# Patient Record
Sex: Female | Born: 1945 | Race: White | Hispanic: No | Marital: Married | Smoking: Never smoker
Health system: Southern US, Community
[De-identification: ages and names within clinical notes are randomized; demographics above are authoritative.]

## PROBLEM LIST (undated history)

## (undated) DIAGNOSIS — M810 Age-related osteoporosis without current pathological fracture: Secondary | ICD-10-CM

## (undated) DIAGNOSIS — M1990 Unspecified osteoarthritis, unspecified site: Secondary | ICD-10-CM

## (undated) DIAGNOSIS — G43909 Migraine, unspecified, not intractable, without status migrainosus: Secondary | ICD-10-CM

## (undated) DIAGNOSIS — K219 Gastro-esophageal reflux disease without esophagitis: Secondary | ICD-10-CM

## (undated) DIAGNOSIS — I10 Essential (primary) hypertension: Secondary | ICD-10-CM

## (undated) DIAGNOSIS — E785 Hyperlipidemia, unspecified: Secondary | ICD-10-CM

## (undated) HISTORY — PX: EYE SURGERY: SHX253

## (undated) HISTORY — DX: Unspecified osteoarthritis, unspecified site: M19.90

## (undated) HISTORY — DX: Essential (primary) hypertension: I10

## (undated) HISTORY — PX: KNEE ARTHROSCOPY: SUR90

## (undated) HISTORY — PX: KNEE CARTILAGE SURGERY: SHX688

## (undated) HISTORY — PX: CARPAL TUNNEL RELEASE: SHX101

## (undated) HISTORY — PX: SPINAL FUSION: SHX223

## (undated) HISTORY — PX: CERVICAL FUSION: SHX112

## (undated) HISTORY — DX: Gastro-esophageal reflux disease without esophagitis: K21.9

## (undated) HISTORY — DX: Migraine, unspecified, not intractable, without status migrainosus: G43.909

## (undated) HISTORY — DX: Age-related osteoporosis without current pathological fracture: M81.0

## (undated) HISTORY — DX: Hyperlipidemia, unspecified: E78.5

## (undated) HISTORY — PX: ABDOMINAL HYSTERECTOMY: SHX81

## (undated) HISTORY — PX: ROTATOR CUFF REPAIR: SHX139

## (undated) HISTORY — PX: OTHER SURGICAL HISTORY: SHX169

---

## 2001-01-12 ENCOUNTER — Encounter: Payer: Self-pay | Admitting: Family Medicine

## 2001-01-12 ENCOUNTER — Ambulatory Visit (HOSPITAL_COMMUNITY): Admission: RE | Admit: 2001-01-12 | Discharge: 2001-01-12 | Payer: Self-pay | Admitting: Family Medicine

## 2002-04-19 ENCOUNTER — Encounter: Payer: Self-pay | Admitting: Family Medicine

## 2002-04-19 ENCOUNTER — Ambulatory Visit (HOSPITAL_COMMUNITY): Admission: RE | Admit: 2002-04-19 | Discharge: 2002-04-19 | Payer: Self-pay | Admitting: Family Medicine

## 2003-06-07 ENCOUNTER — Ambulatory Visit (HOSPITAL_COMMUNITY): Admission: RE | Admit: 2003-06-07 | Discharge: 2003-06-07 | Payer: Self-pay | Admitting: Family Medicine

## 2004-07-30 ENCOUNTER — Ambulatory Visit (HOSPITAL_COMMUNITY): Admission: RE | Admit: 2004-07-30 | Discharge: 2004-07-30 | Payer: Self-pay | Admitting: Family Medicine

## 2004-08-07 ENCOUNTER — Ambulatory Visit (HOSPITAL_COMMUNITY): Admission: RE | Admit: 2004-08-07 | Discharge: 2004-08-07 | Payer: Self-pay | Admitting: Orthopedic Surgery

## 2004-08-07 ENCOUNTER — Ambulatory Visit (HOSPITAL_BASED_OUTPATIENT_CLINIC_OR_DEPARTMENT_OTHER): Admission: RE | Admit: 2004-08-07 | Discharge: 2004-08-07 | Payer: Self-pay | Admitting: Orthopedic Surgery

## 2004-08-23 ENCOUNTER — Ambulatory Visit: Payer: Self-pay | Admitting: Hematology & Oncology

## 2004-09-14 ENCOUNTER — Ambulatory Visit (HOSPITAL_COMMUNITY): Admission: RE | Admit: 2004-09-14 | Discharge: 2004-09-14 | Payer: Self-pay | Admitting: Hematology & Oncology

## 2004-09-20 ENCOUNTER — Ambulatory Visit (HOSPITAL_COMMUNITY): Admission: RE | Admit: 2004-09-20 | Discharge: 2004-09-20 | Payer: Self-pay | Admitting: Hematology & Oncology

## 2004-10-12 ENCOUNTER — Ambulatory Visit: Payer: Self-pay | Admitting: Hematology & Oncology

## 2005-08-26 ENCOUNTER — Ambulatory Visit (HOSPITAL_COMMUNITY): Admission: RE | Admit: 2005-08-26 | Discharge: 2005-08-26 | Payer: Self-pay | Admitting: Family Medicine

## 2006-04-11 ENCOUNTER — Encounter: Admission: RE | Admit: 2006-04-11 | Discharge: 2006-04-11 | Payer: Self-pay | Admitting: Orthopedic Surgery

## 2016-01-29 MED FILL — HUMIRA PEN 40 MG/0.8ML PNKT: 40 | 28 days supply | Qty: 2 | Fill #0

## 2016-02-23 MED FILL — HUMIRA PEN 40 MG/0.8ML PNKT: 40 | 28 days supply | Qty: 2 | Fill #1

## 2016-03-18 ENCOUNTER — Other Ambulatory Visit: Payer: Self-pay | Admitting: Rheumatology

## 2016-03-19 LAB — CBC WITH DIFFERENTIAL/PLATELET
BASOS PCT: 0 %
Basophils Absolute: 0 cells/uL (ref 0–200)
EOS PCT: 3 %
Eosinophils Absolute: 303 cells/uL (ref 15–500)
HCT: 43.3 % (ref 35.0–45.0)
Hemoglobin: 14.5 g/dL (ref 11.7–15.5)
LYMPHS PCT: 36 %
Lymphs Abs: 3636 cells/uL (ref 850–3900)
MCH: 32.4 pg (ref 27.0–33.0)
MCHC: 33.5 g/dL (ref 32.0–36.0)
MCV: 96.9 fL (ref 80.0–100.0)
MPV: 9.8 fL (ref 7.5–12.5)
Monocytes Absolute: 1111 cells/uL — ABNORMAL HIGH (ref 200–950)
Monocytes Relative: 11 %
NEUTROS PCT: 50 %
Neutro Abs: 5050 cells/uL (ref 1500–7800)
PLATELETS: 334 10*3/uL (ref 140–400)
RBC: 4.47 MIL/uL (ref 3.80–5.10)
RDW: 13.7 % (ref 11.0–15.0)
WBC: 10.1 10*3/uL (ref 3.8–10.8)

## 2016-03-19 LAB — COMPLETE METABOLIC PANEL WITH GFR
ALBUMIN: 4 g/dL (ref 3.6–5.1)
ALK PHOS: 67 U/L (ref 33–130)
ALT: 14 U/L (ref 6–29)
AST: 21 U/L (ref 10–35)
BUN: 24 mg/dL (ref 7–25)
CALCIUM: 9.9 mg/dL (ref 8.6–10.4)
CHLORIDE: 102 mmol/L (ref 98–110)
CO2: 27 mmol/L (ref 20–31)
CREATININE: 0.56 mg/dL — AB (ref 0.60–0.93)
GFR, Est Non African American: >89 mL/min (ref >=60)
Glucose, Bld: 92 mg/dL (ref 65–99)
POTASSIUM: 4.8 mmol/L (ref 3.5–5.3)
Sodium: 137 mmol/L (ref 135–146)
Total Bilirubin: 0.3 mg/dL (ref 0.2–1.2)
Total Protein: 6.9 g/dL (ref 6.1–8.1)

## 2016-03-20 ENCOUNTER — Telehealth: Payer: Self-pay | Admitting: Radiology

## 2016-03-20 NOTE — Telephone Encounter (Signed)
I have called patient to advise labs are normal  

## 2016-03-25 MED FILL — HUMIRA PEN 40 MG/0.8ML PNKT: 40 | 28 days supply | Qty: 2 | Fill #2

## 2016-03-28 ENCOUNTER — Other Ambulatory Visit: Payer: Self-pay | Admitting: Rheumatology

## 2016-03-28 NOTE — Telephone Encounter (Signed)
Last Visit: 12/28/15 Next Visit due January / February 2018. Message sent to front desk to schedule patient.  Labs: 03/18/16 WNL   Okay to refill MTX?

## 2016-04-18 ENCOUNTER — Other Ambulatory Visit: Payer: Self-pay | Admitting: *Deleted

## 2016-04-18 MED ORDER — CELECOXIB 200 MG PO CAPS
200.0000 mg | ORAL_CAPSULE | Freq: Every day | ORAL | 0 refills | Status: DC
Start: 2016-04-18 — End: 2016-05-02

## 2016-04-18 NOTE — Telephone Encounter (Signed)
Refill request received via fax for Celebrex  Last Visit: 12/28/15 Next Visit: 05/02/16 Labs: 03/18/16 WNL  Okay to refill Celebrex?

## 2016-05-02 ENCOUNTER — Encounter: Payer: Self-pay | Admitting: Rheumatology

## 2016-05-02 ENCOUNTER — Ambulatory Visit (INDEPENDENT_AMBULATORY_CARE_PROVIDER_SITE_OTHER): Payer: PRIVATE HEALTH INSURANCE | Admitting: Rheumatology

## 2016-05-02 VITALS — BP 128/68 | HR 62 | Resp 14 | Ht 60.0 in | Wt 128.0 lb

## 2016-05-02 DIAGNOSIS — L408 Other psoriasis: Secondary | ICD-10-CM

## 2016-05-02 DIAGNOSIS — L4050 Arthropathic psoriasis, unspecified: Secondary | ICD-10-CM

## 2016-05-02 DIAGNOSIS — M8580 Other specified disorders of bone density and structure, unspecified site: Secondary | ICD-10-CM

## 2016-05-02 DIAGNOSIS — Z79899 Other long term (current) drug therapy: Secondary | ICD-10-CM

## 2016-05-02 DIAGNOSIS — M353 Polymyalgia rheumatica: Secondary | ICD-10-CM

## 2016-05-02 DIAGNOSIS — L409 Psoriasis, unspecified: Secondary | ICD-10-CM | POA: Insufficient documentation

## 2016-05-02 DIAGNOSIS — M5440 Lumbago with sciatica, unspecified side: Secondary | ICD-10-CM

## 2016-05-02 LAB — CBC WITH DIFFERENTIAL/PLATELET
BASOS PCT: 0 %
Basophils Absolute: 0 cells/uL (ref 0–200)
EOS ABS: 231 {cells}/uL (ref 15–500)
EOS PCT: 3 %
HCT: 40.2 % (ref 35.0–45.0)
Hemoglobin: 13.5 g/dL (ref 11.7–15.5)
LYMPHS ABS: 4004 {cells}/uL — AB (ref 850–3900)
Lymphocytes Relative: 52 %
MCH: 32.8 pg (ref 27.0–33.0)
MCHC: 33.6 g/dL (ref 32.0–36.0)
MCV: 97.8 fL (ref 80.0–100.0)
MONOS PCT: 10 %
MPV: 9.8 fL (ref 7.5–12.5)
Monocytes Absolute: 770 cells/uL (ref 200–950)
NEUTROS ABS: 2695 {cells}/uL (ref 1500–7800)
Neutrophils Relative %: 35 %
PLATELETS: 342 10*3/uL (ref 140–400)
RBC: 4.11 MIL/uL (ref 3.80–5.10)
RDW: 13.8 % (ref 11.0–15.0)
WBC: 7.7 10*3/uL (ref 3.8–10.8)

## 2016-05-02 MED ORDER — LIDOCAINE 5 % EX PTCH: 1.0000 | MEDICATED_PATCH | CUTANEOUS | Status: AC

## 2016-05-02 MED ORDER — METHOTREXATE SODIUM CHEMO INJECTION 50 MG/2ML: 20.0000 mg | INTRAMUSCULAR | 0 refills | Status: DC

## 2016-05-02 MED ORDER — ADALIMUMAB 40 MG/0.8ML ~~LOC~~ AJKT
1.0000 | AUTO-INJECTOR | SUBCUTANEOUS | 2 refills | Status: DC
Start: 2016-05-02 — End: 2016-05-02

## 2016-05-02 MED ORDER — CLOBETASOL PROPIONATE 0.05 % EX CREA: 1.0000 "application " | TOPICAL_CREAM | Freq: Two times a day (BID) | CUTANEOUS | 0 refills | Status: DC

## 2016-05-02 MED ORDER — ADALIMUMAB 40 MG/0.8ML ~~LOC~~ AJKT: 1.0000 "pen " | AUTO-INJECTOR | SUBCUTANEOUS | 2 refills | Status: DC

## 2016-05-02 MED FILL — HUMIRA PEN 40 MG/0.8ML PNKT: 40 | 28 days supply | Qty: 2 | Fill #0

## 2016-05-02 NOTE — Progress Notes (Signed)
Office Visit Note  Patient: Amy Hayes             Date of Birth: 1946-03-31           MRN: [id number]             PCP: Ardelle Lesches, MD Referring: No ref. provider found Visit Date: 05/02/2016 Occupation: @GUAROCC @    Subjective:  Follow-up Psoriatic arthritis, psoriasis, high risk prescription  History of Present Illness: Amy Hayes is a 71 y.o. female  Last seen 12/28/2015 Patient is taking Humira every 2 weeks, methotrexate 0.8 ML's every week and folic acid 2 mg every day with good results. Unfortunately she is having a mild flare of psoriasis to bilateral lateral scapular area. Please see photo for full details.  No joint pain. No synovitis. No synovial thickening, joint swelling, joint stiffness.   Activities of Daily Living:  Patient reports morning stiffness for 15 minutes.   Patient Denies nocturnal pain.  Difficulty dressing/grooming: Denies Difficulty climbing stairs: Denies Difficulty getting out of chair: Denies Difficulty using hands for taps, buttons, cutlery, and/or writing: Denies   No Rheumatology ROS completed.   PMFS History:  Patient Active Problem List   Diagnosis Date Noted  . Polymyalgia rheumatica (HCC) 05/02/2016  . Other psoriasis 05/02/2016  . Bilateral low back pain with sciatica 05/02/2016  . Osteopenia 05/02/2016    No past medical history on file.  No family history on file. No past surgical history on file. Social History   Social History Narrative  . No narrative on file     Objective: Vital Signs: BP 128/68   Pulse 62   Resp 14   Ht 5' (1.524 m)   Wt 128 lb (58.1 kg)   LMP  (Approximate)   BMI 25.00 kg/m    Physical Exam   Musculoskeletal Exam:  Full range of motion of all joints Grip strength is equal and strong bilaterally For myalgia tender points are all absent Note that the neck is fused she has slightly decreased range of motion on the right side.  CDAI Exam: No CDAI exam completed.  No  synovitis  She has prominent DIP PIP bilaterally  Investigation: No additional findings. See Epic for labs done November 2017. They are within normal limits. She'll be due for labs about next week so patient wants [place] to draw today.  Imaging: No results found.        Speciality Comments: No specialty comments available.    Procedures:  No procedures performed Allergies: Codeine and Iodine   Assessment / Plan:     Visit Diagnoses: Polymyalgia rheumatica (HCC)  Other psoriasis  Bilateral low back pain with sciatica, sciatica laterality unspecified, unspecified chronicity - Plan: lidocaine (LIDODERM) 5 % 1 patch  Osteopenia, unspecified location  High risk medications (not anticoagulants) long-term use - Plan: CBC with Differential/Platelet, COMPLETE METABOLIC PANEL WITH GFR, COMPLETE METABOLIC PANEL WITH GFR, CBC with Differential/Platelet, Quantiferon tb gold assay (blood)  Plan: #1 psoriatic arthritis, psoriasis fairly well-controlled with the combination of Humira every 2 weeks, methotrexate 0.8 ML's every week, folic acid 2 mg every day.  #2: Patient has a mild flare for psoriasis over the last few days. She does not have clobetasol cream and wants [place] to give her refill  #3: History of nail dystrophy  #4. 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 He  #5: CBC with differential, CMP with GFR today.  #6: Patient will be due for CBC with differential,  CMP with GFR, TB gold in about 3 months.  #7: Patient's last bone density was March 2017 with a T score of the right radius at -2.0. We will monitor. Note that compared to the 2015 DEXA there is only -2.2% change which is insignificant. I've encouraged the patient to continue weightbearing exercise, calcium, vitamin D.  Return to clinic in 4-5 months for regular follow-up.  Orders: Orders Placed This Encounter  Procedures  . CBC with Differential/Platelet  . COMPLETE METABOLIC PANEL WITH GFR  .  COMPLETE METABOLIC PANEL WITH GFR  . CBC with Differential/Platelet  . Quantiferon tb gold assay (blood)   Meds ordered this encounter  Medications  . lidocaine (LIDODERM) 5 % 1 patch    Give 30 day supply to patient. With 2 refills  . clobetasol cream (TEMOVATE) 0.05 %    Sig: Apply 1 application topically 2 (two) times daily.    Dispense:  30 g    Refill:  0    Order Specific Question:   Supervising Provider    Answer:   Pollyann SavoyEVESHWAR, SHAILI [[id number]]  . methotrexate 50 MG/2ML injection    Sig: Inject 0.8 mLs (20 mg total) into the skin once a week.    Dispense:  2 mL    Refill:  0    Please dispense 2 ML vials with preservatives with sufficient quantity for ninety-day supply    Order Specific Question:   Supervising Provider    Answer:   Pollyann SavoyEVESHWAR, SHAILI [[id number]]  . Adalimumab (HUMIRA PEN) 40 MG/0.8ML PNKT    Sig: Inject 1 pen into the skin every 14 (fourteen) days.    Dispense:  2 each    Refill:  2    Order Specific Question:   Supervising Provider    Answer:   Pollyann SavoyEVESHWAR, SHAILI [phone][[id number]]    Face-to-face time spent with patient was 40 minutes. 50% of time was spent in counseling and coordination of care.  Follow-Up Instructions: Return in about 5 months (around 09/30/2016) for PsA,Ps,HRRX,Humira,Mtx0.778ml,.   Tawni PummelNaitik Burdette Forehand, PA-C  Note - This record has been created using [employer] software.  Chart creation errors have been sought, but may not always  have been located. Such creation errors do not reflect on  the standard of medical care.

## 2016-05-03 LAB — COMPLETE METABOLIC PANEL WITH GFR
ALT: 16 U/L (ref 6–29)
AST: 24 U/L (ref 10–35)
Albumin: 3.8 g/dL (ref 3.6–5.1)
Alkaline Phosphatase: 59 U/L (ref 33–130)
BILIRUBIN TOTAL: 0.6 mg/dL (ref 0.2–1.2)
BUN: 23 mg/dL (ref 7–25)
CO2: 23 mmol/L (ref 20–31)
CREATININE: 0.68 mg/dL (ref 0.60–0.93)
Calcium: 9.1 mg/dL (ref 8.6–10.4)
Chloride: 102 mmol/L (ref 98–110)
GFR, Est African American: >89 mL/min (ref >=60)
GFR, Est Non African American: 89 mL/min (ref >=60)
GLUCOSE: 81 mg/dL (ref 65–99)
Potassium: 4.5 mmol/L (ref 3.5–5.3)
SODIUM: 137 mmol/L (ref 135–146)
TOTAL PROTEIN: 6.6 g/dL (ref 6.1–8.1)

## 2016-05-03 NOTE — Progress Notes (Signed)
Tell patientCMP with GFR is normalCBC with differential is within normal limitsNo change in treatmentSend copy of these labs to PCP

## 2016-05-20 ENCOUNTER — Other Ambulatory Visit: Payer: Self-pay | Admitting: *Deleted

## 2016-05-20 MED ORDER — CELECOXIB 200 MG PO CAPS: 200.0000 mg | ORAL_CAPSULE | Freq: Every day | ORAL | 0 refills | Status: DC

## 2016-05-20 NOTE — Telephone Encounter (Signed)
Refill request received via fax for Celebrex  Last Visit: 05/02/16 Next Visit: 09/30/16 Labs: 05/02/16 WNL  Okay to refill Celebrex?

## 2016-05-29 ENCOUNTER — Other Ambulatory Visit: Payer: Self-pay | Admitting: Rheumatology

## 2016-05-29 MED ORDER — CLOBETASOL PROPIONATE 0.05 % EX CREA: 1.0000 "application " | TOPICAL_CREAM | Freq: Two times a day (BID) | CUTANEOUS | 0 refills | Status: DC

## 2016-05-29 NOTE — Telephone Encounter (Signed)
Patient would like to know if she can get a refill of the ointment that Mr. Leane Callanwala prescribed at her last visit for the rash on her back. Patient states her back is "scaly and looks like a sunburn now." Patient is [facility] [place].

## 2016-05-29 NOTE — Telephone Encounter (Signed)
Patient states she the area she has been using the cream on is peeling and looking like a sunburn. Patient has been using the Clobetasol Cream. She states she used it from May 02, 2016  X 1 week then stopped for a week and then started it again for a week. Patient states it does help. Patient is requesting a refill.   Last visit: 05/02/16 Next Visit: 09/30/16  Okay to refill: Clobetasol Cream?

## 2016-05-30 ENCOUNTER — Telehealth: Payer: Self-pay

## 2016-05-30 ENCOUNTER — Telehealth: Payer: Self-pay | Admitting: Pharmacist

## 2016-05-30 MED FILL — HUMIRA PEN 40 MG/0.8ML PNKT: 40 | 28 days supply | Qty: 2 | Fill #1

## 2016-05-30 NOTE — Telephone Encounter (Signed)
Entered in error

## 2016-05-30 NOTE — Telephone Encounter (Signed)
Noted patient's last refill of humira was on 05/02/16 at [facility]. I called patient with a refill reminder call. The phone was out of service so I reached her husband on his phone. Mr. Sharl MaKerr confirms that his wife needs a refill mailed to their home.     Will have the outpatient pharmacy process patient's prescription at this time.    Kyon Bentler, [place], CPhT

## 2016-06-24 ENCOUNTER — Telehealth: Payer: Self-pay

## 2016-06-24 MED FILL — HUMIRA PEN 40 MG/0.8ML PNKT: 40 | 28 days supply | Qty: 2 | Fill #2

## 2016-06-24 NOTE — Telephone Encounter (Signed)
Noted patient's last refill of Humira was on 05/30/16 at [facility]. I called patient with a refill reminder call.  Patient confirms she does need a refill at this time. Address was verified. Will have the outpatient pharmacy process patient's prescription at this time and mail to her home.   Paeton Latouche, [place], CPhT 12:19 PM

## 2016-07-26 ENCOUNTER — Other Ambulatory Visit: Payer: Self-pay | Admitting: Rheumatology

## 2016-07-26 NOTE — Telephone Encounter (Signed)
Last visit: 05/02/16 Next Visit: 09/30/16  Okay to refill Syringes?

## 2016-07-31 ENCOUNTER — Other Ambulatory Visit: Payer: Self-pay | Admitting: Pharmacist

## 2016-07-31 ENCOUNTER — Telehealth: Payer: Self-pay

## 2016-07-31 DIAGNOSIS — Z79899 Other long term (current) drug therapy: Secondary | ICD-10-CM

## 2016-07-31 NOTE — Telephone Encounter (Signed)
Noted patient's last refill of Humira was on 06/24/16 at [facility].  I called patient with a refill reminder call.  Patient confirms she does need a refill at this time. She will use her last pen on Saturday, April 14th. She is out of refills at the pharmacy.   Can we send a new Rx to [facility] for her? Thanks!  Dyasia Firestine, [place], CPhT 9:51 AM

## 2016-07-31 NOTE — Telephone Encounter (Signed)
Patient is needing Humira refill.  Reviewed her chart and noted that she is due for standing labs and TB Gold.  I called patient and informed her of this.  Patient confirms she will go to the lab today or tomorrow to get labs drawn.  Lab orders have been released.

## 2016-08-01 ENCOUNTER — Other Ambulatory Visit: Payer: Self-pay | Admitting: *Deleted

## 2016-08-01 ENCOUNTER — Other Ambulatory Visit: Payer: Self-pay | Admitting: Rheumatology

## 2016-08-01 ENCOUNTER — Telehealth: Payer: Self-pay | Admitting: Rheumatology

## 2016-08-01 DIAGNOSIS — Z79899 Other long term (current) drug therapy: Secondary | ICD-10-CM

## 2016-08-01 DIAGNOSIS — Z9225 Personal history of immunosupression therapy: Secondary | ICD-10-CM

## 2016-08-01 NOTE — Telephone Encounter (Signed)
Patient left a message and wanted someone to call her in regards to a drug test she needs to get from the lab.  CB#[phone].  Thank you.

## 2016-08-01 NOTE — Telephone Encounter (Signed)
Contacted patient, she was calling to verify her orders for the TB Gold , CBC and CMP have been faxed the lab in [place]. Patient advised that the orders have been faxed. Patient is not due a drug test through our office.

## 2016-08-01 NOTE — Telephone Encounter (Signed)
Lab orders released and faxed.  

## 2016-08-01 NOTE — Telephone Encounter (Signed)
Patient is at [facility] in [place].  They need the lab orders faxed to [phone].  Thank you.

## 2016-08-02 LAB — CBC WITH DIFFERENTIAL/PLATELET
Basophils Absolute: 83 cells/uL (ref 0–200)
Basophils Relative: 1 %
EOS PCT: 3 %
Eosinophils Absolute: 249 cells/uL (ref 15–500)
HCT: 41.9 % (ref 35.0–45.0)
HEMOGLOBIN: 13.7 g/dL (ref 11.7–15.5)
LYMPHS ABS: 3486 {cells}/uL (ref 850–3900)
LYMPHS PCT: 42 %
MCH: 32.2 pg (ref 27.0–33.0)
MCHC: 32.7 g/dL (ref 32.0–36.0)
MCV: 98.4 fL (ref 80.0–100.0)
MONO ABS: 830 {cells}/uL (ref 200–950)
MPV: 9.9 fL (ref 7.5–12.5)
Monocytes Relative: 10 %
Neutro Abs: 3652 cells/uL (ref 1500–7800)
Neutrophils Relative %: 44 %
Platelets: 329 10*3/uL (ref 140–400)
RBC: 4.26 MIL/uL (ref 3.80–5.10)
RDW: 13.6 % (ref 11.0–15.0)
WBC: 8.3 10*3/uL (ref 3.8–10.8)

## 2016-08-02 LAB — COMPLETE METABOLIC PANEL WITH GFR
ALT: 14 U/L (ref 6–29)
AST: 24 U/L (ref 10–35)
Albumin: 3.6 g/dL (ref 3.6–5.1)
Alkaline Phosphatase: 67 U/L (ref 33–130)
BUN: 19 mg/dL (ref 7–25)
CALCIUM: 9.2 mg/dL (ref 8.6–10.4)
CHLORIDE: 105 mmol/L (ref 98–110)
CO2: 22 mmol/L (ref 20–31)
Creat: 0.61 mg/dL (ref 0.60–0.93)
GFR, Est Non African American: >89 mL/min (ref >=60)
Glucose, Bld: 91 mg/dL (ref 65–99)
Potassium: 4.4 mmol/L (ref 3.5–5.3)
Sodium: 137 mmol/L (ref 135–146)
Total Bilirubin: 0.4 mg/dL (ref 0.2–1.2)
Total Protein: 6.5 g/dL (ref 6.1–8.1)

## 2016-08-02 MED ORDER — ADALIMUMAB 40 MG/0.8ML ~~LOC~~ AJKT: 1.0000 "pen " | AUTO-INJECTOR | SUBCUTANEOUS | 2 refills | Status: DC

## 2016-08-02 MED FILL — HUMIRA PEN 40 MG/0.8ML PNKT: 40 | 28 days supply | Qty: 2 | Fill #0

## 2016-08-02 NOTE — Telephone Encounter (Signed)
Last Visit: 05/02/16 Next Visit: 09/30/16 Labs: 05/02/16 WNL Patient updated labs on 08/01/16 TB Gold: 08/31/15 Neg  Okay to refill Humira?

## 2016-08-02 NOTE — Telephone Encounter (Signed)
Okay to refill HumiraPatient's TB gold is due to be updated May 2018.Patient has gone to labs yesterday to get the updated according to the above.Okay to refill Humira.Please confirm that I refilled the Humira

## 2016-08-03 LAB — QUANTIFERON TB GOLD ASSAY (BLOOD)
INTERFERON GAMMA RELEASE ASSAY: NEGATIVE
Mitogen-Nil: 9.13 IU/mL
QUANTIFERON NIL VALUE: 0.12 [IU]/mL

## 2016-08-03 NOTE — Progress Notes (Signed)
WNL

## 2016-08-13 ENCOUNTER — Other Ambulatory Visit: Payer: Self-pay | Admitting: Rheumatology

## 2016-08-13 NOTE — Telephone Encounter (Signed)
05/02/16 last visit  09/30/16 next visit  Ok to refill per Dr Corliss Skains

## 2016-08-18 ENCOUNTER — Other Ambulatory Visit: Payer: Self-pay | Admitting: Rheumatology

## 2016-08-19 NOTE — Telephone Encounter (Signed)
05/02/16 last visit  09/30/16 next visit  Labs WNL CBC CMP 08/01/16 Ok to refill per Dr Corliss Skains

## 2016-09-06 ENCOUNTER — Telehealth: Payer: Self-pay

## 2016-09-06 MED FILL — HUMIRA PEN 40 MG/0.8ML PNKT: 40 | 28 days supply | Qty: 2 | Fill #1

## 2016-09-06 NOTE — Telephone Encounter (Signed)
Received a [facility] from the [facility] with a prior authorization request for Humira. A request was submitted through Cover my meds. Will update once we receive a response.  Kerrigan Gombos, [place], CPhT 8:50 AM

## 2016-09-18 ENCOUNTER — Other Ambulatory Visit: Payer: Self-pay | Admitting: Rheumatology

## 2016-09-19 NOTE — Telephone Encounter (Signed)
Last Visit: 05/02/16 Next Visit: 09/30/16 Labs: 08/01/16 WNL  Okay to refill MTX?

## 2016-09-23 ENCOUNTER — Other Ambulatory Visit: Payer: Self-pay | Admitting: Rheumatology

## 2016-09-23 NOTE — Telephone Encounter (Signed)
Last Visit: 05/02/16 Next Visit: 09/30/16  Okay to refill Folic Acid?

## 2016-09-27 ENCOUNTER — Other Ambulatory Visit: Payer: Self-pay | Admitting: Rheumatology

## 2016-09-27 NOTE — Telephone Encounter (Signed)
05/02/16 last visit  09/30/16 next visit  Labs WNL 08/01/16 Ok to refill Celebrex ?

## 2016-09-30 ENCOUNTER — Encounter: Payer: Self-pay | Admitting: Rheumatology

## 2016-09-30 ENCOUNTER — Ambulatory Visit (INDEPENDENT_AMBULATORY_CARE_PROVIDER_SITE_OTHER): Payer: PRIVATE HEALTH INSURANCE | Admitting: Rheumatology

## 2016-09-30 VITALS — BP 124/62 | HR 68 | Resp 14 | Ht 63.0 in | Wt 124.0 lb

## 2016-09-30 DIAGNOSIS — L408 Other psoriasis: Secondary | ICD-10-CM

## 2016-09-30 DIAGNOSIS — M8580 Other specified disorders of bone density and structure, unspecified site: Secondary | ICD-10-CM | POA: Diagnosis not present

## 2016-09-30 DIAGNOSIS — Z79899 Other long term (current) drug therapy: Secondary | ICD-10-CM

## 2016-09-30 DIAGNOSIS — L4050 Arthropathic psoriasis, unspecified: Secondary | ICD-10-CM | POA: Diagnosis not present

## 2016-09-30 LAB — CBC WITH DIFFERENTIAL/PLATELET
BASOS ABS: 0 {cells}/uL (ref 0–200)
Basophils Relative: 0 %
EOS PCT: 3 %
Eosinophils Absolute: 222 cells/uL (ref 15–500)
HEMATOCRIT: 44.3 % (ref 35.0–45.0)
HEMOGLOBIN: 14.7 g/dL (ref 11.7–15.5)
LYMPHS ABS: 2886 {cells}/uL (ref 850–3900)
Lymphocytes Relative: 39 %
MCH: 32.5 pg (ref 27.0–33.0)
MCHC: 33.2 g/dL (ref 32.0–36.0)
MCV: 97.8 fL (ref 80.0–100.0)
MPV: 9.6 fL (ref 7.5–12.5)
Monocytes Absolute: 962 cells/uL — ABNORMAL HIGH (ref 200–950)
Monocytes Relative: 13 %
NEUTROS PCT: 45 %
Neutro Abs: 3330 cells/uL (ref 1500–7800)
Platelets: 337 10*3/uL (ref 140–400)
RBC: 4.53 MIL/uL (ref 3.80–5.10)
RDW: 13.8 % (ref 11.0–15.0)
WBC: 7.4 10*3/uL (ref 3.8–10.8)

## 2016-09-30 NOTE — Patient Instructions (Signed)
Standing Labs We placed an order today for your standing lab work.    Please come back and get your standing labs in  September 2018 December 2018 March 2019 (In March 2019 consider getting a TB gold; contact our office)  We have open lab Monday through Friday from 8:30-11:30 AM and 1:30-4 PM at the office of Dr. Arbutus PedShaili Deveshwar/Sherrod Toothman, PA.   The office is located at [address], [address], [place], [place] [address] No appointment is necessary.   Labs are drawn by [facility].  You may receive a bill from [facility] for your lab work. If you have any questions regarding directions or hours of operation,  please call [phone].

## 2016-09-30 NOTE — Progress Notes (Signed)
Office Visit Note  Patient: Amy Hayes             Date of Birth: [birth year]           MRN: [id number]             PCP: Phebe Colla, MD Referring: Phebe Colla, MD Visit Date: 09/30/2016 Occupation: @GUAROCC @    Subjective:  Medication Management   History of Present Illness: Amy Hayes is a 71 y.o. female  May 02 2016  Patient has a history of psoriasis and psoriatic arthritis. She is on Humira every 2 weeks and methotrexate 0.20m every week. Folic acid 247mqd  Currently she is doing well with her psoriasis and psoriatic arthritis.  Patient is currently moving from her current house to a different house and is finding the packing and heavy lifting to be very strenuous.    Activities of Daily Living:  Patient reports morning stiffness for 15 minutes.   Patient Denies nocturnal pain.  Difficulty dressing/grooming: Denies Difficulty climbing stairs: Denies Difficulty getting out of chair: Denies Difficulty using hands for taps, buttons, cutlery, and/or writing: Denies   Review of Systems  Constitutional: Negative for fatigue.  HENT: Negative for mouth sores and mouth dryness.   Eyes: Negative for dryness.  Respiratory: Negative for shortness of breath.   Gastrointestinal: Negative for constipation and diarrhea.  Musculoskeletal: Negative for myalgias and myalgias.  Skin: Negative for sensitivity to sunlight.  Psychiatric/Behavioral: Negative for decreased concentration and sleep disturbance.    PMFS History:  Patient Active Problem List   Diagnosis Date Noted  . Polymyalgia rheumatica ([place]/02/2017  . Other psoriasis 05/02/2016  . Bilateral low back pain with sciatica 05/02/2016  . Osteopenia 05/02/2016    Past Medical History:  Diagnosis Date  . GERD (gastroesophageal reflux disease)   . Osteoporosis     No family history on file. Past Surgical History:  Procedure Laterality Date  . CARPAL TUNNEL RELEASE    . KNEE ARTHROSCOPY Right     . SPINAL FUSION     cervical fusion   Social History   Social History Narrative  . No narrative on file     Objective: Vital Signs: BP 124/62   Pulse 68   Resp 14   Ht 5' 3"  (1.6 m)   Wt 124 lb (56.2 kg)   BMI 21.97 kg/m    Physical Exam  Constitutional: She is oriented to person, place, and time. She appears well-developed and well-nourished.  HENT:  Head: Normocephalic and atraumatic.  Eyes: EOM are normal. Pupils are equal, round, and reactive to light.  Cardiovascular: Normal rate, regular rhythm and normal heart sounds.  Exam reveals no gallop and no friction rub.   No murmur heard. Pulmonary/Chest: Effort normal and breath sounds normal. She has no wheezes. She has no rales.  Abdominal: Soft. Bowel sounds are normal. She exhibits no distension. There is no tenderness. There is no guarding. No hernia.  Musculoskeletal: Normal range of motion. She exhibits no edema, tenderness or deformity.  Lymphadenopathy:    She has no cervical adenopathy.  Neurological: She is alert and oriented to person, place, and time. Coordination normal.  Skin: Skin is warm and dry. Capillary refill takes less than 2 seconds. No rash noted.  Psychiatric: She has a normal mood and affect. Her behavior is normal.  Nursing note and vitals reviewed.    Musculoskeletal Exam:  Full range of motion of all joints Grip strength is equal and strong  bilaterally Fibromyalgia tender points are all absent  CDAI Exam: No CDAI exam completed.  No synovitis on examination DIP PIP prominence bilaterally with few angulations which is source of some of her hand pain. Note: She does not have any synovitis in her joint pain is not coming from psoriatic arthritis  Investigation: No additional findings. Orders Only on 08/01/2016  Component Date Value Ref Range Status  . Sodium 08/01/2016 137  135 - 146 mmol/L Final  . Potassium 08/01/2016 4.4  3.5 - 5.3 mmol/L Final  . Chloride 08/01/2016 105  98 - 110  mmol/L Final  . CO2 08/01/2016 22  20 - 31 mmol/L Final  . Glucose, Bld 08/01/2016 91  65 - 99 mg/dL Final  . BUN 08/01/2016 19  7 - 25 mg/dL Final  . Creat 08/01/2016 0.61  0.60 - 0.93 mg/dL Final   Comment:   For patients > or = 71 years of age: The upper reference limit for Creatinine is approximately 13% higher for people identified as African-American.     . Total Bilirubin 08/01/2016 0.4  0.2 - 1.2 mg/dL Final  . Alkaline Phosphatase 08/01/2016 67  33 - 130 U/L Final  . AST 08/01/2016 24  10 - 35 U/L Final  . ALT 08/01/2016 14  6 - 29 U/L Final  . Total Protein 08/01/2016 6.5  6.1 - 8.1 g/dL Final  . Albumin 08/01/2016 3.6  3.6 - 5.1 g/dL Final  . Calcium 08/01/2016 9.2  8.6 - 10.4 mg/dL Final  . GFR, Est African American 08/01/2016 >89  >=60 mL/min Final  . GFR, Est Non African American 08/01/2016 >89  >=60 mL/min Final  . WBC 08/01/2016 8.3  3.8 - 10.8 K/uL Final  . RBC 08/01/2016 4.26  3.80 - 5.10 MIL/uL Final  . Hemoglobin 08/01/2016 13.7  11.7 - 15.5 g/dL Final  . HCT 08/01/2016 41.9  35.0 - 45.0 % Final  . MCV 08/01/2016 98.4  80.0 - 100.0 fL Final  . MCH 08/01/2016 32.2  27.0 - 33.0 pg Final  . MCHC 08/01/2016 32.7  32.0 - 36.0 g/dL Final  . RDW 08/01/2016 13.6  11.0 - 15.0 % Final  . Platelets 08/01/2016 329  140 - 400 K/uL Final  . MPV 08/01/2016 9.9  7.5 - 12.5 fL Final  . Neutro Abs 08/01/2016 3652  1,500 - 7,800 cells/uL Final  . Lymphs Abs 08/01/2016 3486  850 - 3,900 cells/uL Final  . Monocytes Absolute 08/01/2016 830  200 - 950 cells/uL Final  . Eosinophils Absolute 08/01/2016 249  15 - 500 cells/uL Final  . Basophils Absolute 08/01/2016 83  0 - 200 cells/uL Final  . Neutrophils Relative % 08/01/2016 44  % Final  . Lymphocytes Relative 08/01/2016 42  % Final  . Monocytes Relative 08/01/2016 10  % Final  . Eosinophils Relative 08/01/2016 3  % Final  . Basophils Relative 08/01/2016 1  % Final  . Smear Review 08/01/2016 Criteria for review not met   Final  .  Interferon Gamma Release Assay 08/01/2016 NEGATIVE  NEGATIVE Final   Negative test result. M. tuberculosis complex infection unlikely.  . Quantiferon Nil Value 08/01/2016 0.12  IU/mL Final  . Mitogen-Nil 08/01/2016 9.13  IU/mL Final  . Quantiferon Tb Ag Minus Nil Value 08/01/2016 <0.00  IU/mL Final   Comment:   The Nil tube value is used to determine if the patient has a preexisting immune response which could cause a false-positive reading on the test. In order for a  test to be valid, the Nil tube must have a value of less than or equal to 8.0 IU/mL.   The mitogen control tube is used to assure the patient has a healthy immune status and also serves as a control for correct blood handling and incubation. It is used to detect false-negative readings. The mitogen tube must have a gamma interferon value of greater than or equal to 0.5 IU/mL higher than the value of the Nil tube.   The TB antigen tube is coated with the M. tuberculosis specific antigens. For a test to be considered positive, the TB antigen tube value minus the Nil tube value must be greater than or equal to 0.35 IU/mL.   For additional information, please refer to http://education.questdiagnostics.com/faq/QFT (This link is being provided for informational/educational purposes only.)   Office Visit on 05/02/2016  Component Date Value Ref Range Status  . WBC 05/02/2016 7.7  3.8 - 10.8 K/uL Final  . RBC 05/02/2016 4.11  3.80 - 5.10 MIL/uL Final  . Hemoglobin 05/02/2016 13.5  11.7 - 15.5 g/dL Final  . HCT 05/02/2016 40.2  35.0 - 45.0 % Final  . MCV 05/02/2016 97.8  80.0 - 100.0 fL Final  . MCH 05/02/2016 32.8  27.0 - 33.0 pg Final  . MCHC 05/02/2016 33.6  32.0 - 36.0 g/dL Final  . RDW 05/02/2016 13.8  11.0 - 15.0 % Final  . Platelets 05/02/2016 342  140 - 400 K/uL Final  . MPV 05/02/2016 9.8  7.5 - 12.5 fL Final  . Neutro Abs 05/02/2016 2695  1,500 - 7,800 cells/uL Final  . Lymphs Abs 05/02/2016 4004* 850 - 3,900  cells/uL Final  . Monocytes Absolute 05/02/2016 770  200 - 950 cells/uL Final  . Eosinophils Absolute 05/02/2016 231  15 - 500 cells/uL Final  . Basophils Absolute 05/02/2016 0  0 - 200 cells/uL Final  . Neutrophils Relative % 05/02/2016 35  % Final  . Lymphocytes Relative 05/02/2016 52  % Final  . Monocytes Relative 05/02/2016 10  % Final  . Eosinophils Relative 05/02/2016 3  % Final  . Basophils Relative 05/02/2016 0  % Final  . Smear Review 05/02/2016 Criteria for review not met   Final  . Sodium 05/02/2016 137  135 - 146 mmol/L Final  . Potassium 05/02/2016 4.5  3.5 - 5.3 mmol/L Final  . Chloride 05/02/2016 102  98 - 110 mmol/L Final  . CO2 05/02/2016 23  20 - 31 mmol/L Final  . Glucose, Bld 05/02/2016 81  65 - 99 mg/dL Final  . BUN 05/02/2016 23  7 - 25 mg/dL Final  . Creat 05/02/2016 0.68  0.60 - 0.93 mg/dL Final   Comment:   For patients > or = 71 years of age: The upper reference limit for Creatinine is approximately 13% higher for people identified as African-American.     . Total Bilirubin 05/02/2016 0.6  0.2 - 1.2 mg/dL Final  . Alkaline Phosphatase 05/02/2016 59  33 - 130 U/L Final  . AST 05/02/2016 24  10 - 35 U/L Final  . ALT 05/02/2016 16  6 - 29 U/L Final  . Total Protein 05/02/2016 6.6  6.1 - 8.1 g/dL Final  . Albumin 05/02/2016 3.8  3.6 - 5.1 g/dL Final  . Calcium 05/02/2016 9.1  8.6 - 10.4 mg/dL Final  . GFR, Est African American 05/02/2016 >89  >=60 mL/min Final  . GFR, Est Non African American 05/02/2016 89  >=60 mL/min Final     Imaging:  No results found.  Speciality Comments: No specialty comments available.    Procedures:  No procedures performed Allergies: Codeine and Iodine   Assessment / Plan:     Visit Diagnoses: Psoriatic arthropathy ([place])  Other psoriasis  High risk medications (not anticoagulants) long-term use - 09/30/2016: Humira every 2 weeksAnd methotrexate 0.8 ML's every weekFolic acid 2 mg daily - Plan: CBC with  Differential/Platelet, COMPLETE METABOLIC PANEL WITH GFR, CBC with Differential/Platelet, COMPLETE METABOLIC PANEL WITH GFR, CBC with Differential/Platelet, COMPLETE METABOLIC PANEL WITH GFR  Osteopenia, unspecified location   Plan: #1: Psoriatic arthritis No flare Doing well Response with current treatment of Humira and methotrexate  #2: High risk prescription Humira every 2 weeks And methotrexate 0.8 ML every week Folic acid 2 mg daily Patient is due for labs today: CBC with differential and CMP with GFR TB goal is negative as of April 2018; CBC and CMP were normal in April 2018.  Patient is due for repeat labs every 3 months starting September 2018 and she will get them done locally at [facility] area where she lives. I have entered them into Epic and [facility] she should not have any problems getting the blood drawn.  #3: Osteopenia Addressed by Dr. Phebe Colla. Last bone density was March 2017 Next one will be due April 2019 and patient will coordinate with her PCP  #4: Return to clinic in 5 months  Orders: Orders Placed This Encounter  Procedures  . CBC with Differential/Platelet  . COMPLETE METABOLIC PANEL WITH GFR  . CBC with Differential/Platelet  . COMPLETE METABOLIC PANEL WITH GFR   No orders of the defined types were placed in this encounter.   Face-to-face time spent with patient was 30 minutes. 50% of time was spent in counseling and coordination of care.  Follow-Up Instructions: Return in about 5 months (around 03/02/2017) for PsA,Ps,Humira q 2 weeks, mtx 2.6ST, folic 57m.   NEliezer Lofts PA-C  Note - This record has been created using [employer]  Chart creation errors have been sought, but may not always  have been located. Such creation errors do not reflect on  the standard of medical care.

## 2016-10-01 ENCOUNTER — Encounter: Payer: Self-pay | Admitting: Rheumatology

## 2016-10-01 LAB — COMPLETE METABOLIC PANEL WITH GFR
ALBUMIN: 4 g/dL (ref 3.6–5.1)
ALK PHOS: 69 U/L (ref 33–130)
ALT: 13 U/L (ref 6–29)
AST: 20 U/L (ref 10–35)
BUN: 22 mg/dL (ref 7–25)
CALCIUM: 9.9 mg/dL (ref 8.6–10.4)
CO2: 25 mmol/L (ref 20–31)
Chloride: 100 mmol/L (ref 98–110)
Creat: 0.74 mg/dL (ref 0.60–0.93)
GFR, EST NON AFRICAN AMERICAN: 82 mL/min (ref >=60)
GFR, Est African American: >89 mL/min (ref >=60)
Glucose, Bld: 85 mg/dL (ref 65–99)
POTASSIUM: 5.5 mmol/L — AB (ref 3.5–5.3)
Sodium: 137 mmol/L (ref 135–146)
Total Bilirubin: 0.5 mg/dL (ref 0.2–1.2)
Total Protein: 7.1 g/dL (ref 6.1–8.1)

## 2016-10-02 NOTE — Telephone Encounter (Signed)
I Amy, please asked patient to return to clinic so we can redraw her CMP and recheck her potassium level since there is slightly elevated probably due to hemolyzed blood

## 2016-10-04 MED FILL — HUMIRA PEN 40 MG/0.8ML PNKT: 40 | 28 days supply | Qty: 2 | Fill #2

## 2016-10-07 ENCOUNTER — Encounter: Payer: Self-pay | Admitting: Rheumatology

## 2016-10-17 ENCOUNTER — Telehealth: Payer: Self-pay | Admitting: Rheumatology

## 2016-10-17 DIAGNOSIS — Z79899 Other long term (current) drug therapy: Secondary | ICD-10-CM

## 2016-10-17 LAB — CBC WITH DIFFERENTIAL/PLATELET
Basophils Absolute: 62 cells/uL (ref 0–200)
Basophils Relative: 1 %
EOS ABS: 124 {cells}/uL (ref 15–500)
Eosinophils Relative: 2 %
HCT: 44.4 % (ref 35.0–45.0)
Hemoglobin: 14.5 g/dL (ref 11.7–15.5)
LYMPHS PCT: 46 %
Lymphs Abs: 2852 cells/uL (ref 850–3900)
MCH: 32.1 pg (ref 27.0–33.0)
MCHC: 32.7 g/dL (ref 32.0–36.0)
MCV: 98.2 fL (ref 80.0–100.0)
MONOS PCT: 9 %
MPV: 9.7 fL (ref 7.5–12.5)
Monocytes Absolute: 558 cells/uL (ref 200–950)
NEUTROS ABS: 2604 {cells}/uL (ref 1500–7800)
Neutrophils Relative %: 42 %
PLATELETS: 352 10*3/uL (ref 140–400)
RBC: 4.52 MIL/uL (ref 3.80–5.10)
RDW: 13.6 % (ref 11.0–15.0)
WBC: 6.2 10*3/uL (ref 3.8–10.8)

## 2016-10-17 NOTE — Telephone Encounter (Signed)
I called Mrs. Sharl MaKerr to discuss Humira.  She reports she is going to be switching to medicare on July 1.  She did not know details of her medicare insurance and asked that I call her husband to discuss further.   I called Mr. Sharl MaKerr.  He reports that patient will have part D insurance through [employer].  He said she has original medicare and is working on signing up for a Research scientist (physical sciences)medicare supplement plan.  I discussed that we could consider applying for Humira patient assistance program to get Humira for free from the manufacturer.  Discussed that this is income based.  Also discussed option to consider switching patient to infusion medications depending on whether medicare supplement will cover those infusions.  I advised patient would need an appointment to discuss changing medications if they decide they want to pursue this option.  Mr. Sharl MaKerr states he will discuss options with Mrs. Sharl MaKerr and will get back to us.    Patient did receive a refill of Humira recently and she has one pen remaining.  I advised him to contact us if she is out of medication and due for a refill and we will provide her with a sample if able.  He voiced understanding.   Lilla Shookachel Yetta Marceaux, Pharm.D., BCPS, CPP Clinical Pharmacist Pager: [phone] Phone: [phone] 10/17/2016 4:41 PM

## 2016-10-17 NOTE — Telephone Encounter (Signed)
Patient is going on Medicare, and Humeria is going to cost $1700./ month in patient cost. She will not be able to afford that, and would like to know if there is another medicine that is not so expensive. Please call patient to advise.

## 2016-10-18 LAB — COMPLETE METABOLIC PANEL WITH GFR
ALBUMIN: 4.1 g/dL (ref 3.6–5.1)
ALK PHOS: 72 U/L (ref 33–130)
ALT: 14 U/L (ref 6–29)
AST: 22 U/L (ref 10–35)
BILIRUBIN TOTAL: 0.6 mg/dL (ref 0.2–1.2)
BUN: 27 mg/dL — ABNORMAL HIGH (ref 7–25)
CALCIUM: 9.3 mg/dL (ref 8.6–10.4)
CO2: 26 mmol/L (ref 20–31)
CREATININE: 0.58 mg/dL — AB (ref 0.60–0.93)
Chloride: 98 mmol/L (ref 98–110)
Glucose, Bld: 56 mg/dL — ABNORMAL LOW (ref 65–99)
Potassium: 4.4 mmol/L (ref 3.5–5.3)
Sodium: 136 mmol/L (ref 135–146)
TOTAL PROTEIN: 7.1 g/dL (ref 6.1–8.1)

## 2016-10-18 NOTE — Telephone Encounter (Signed)
wnl

## 2016-10-29 ENCOUNTER — Telehealth: Payer: Self-pay | Admitting: Pharmacist

## 2016-10-29 NOTE — Telephone Encounter (Signed)
I called patient to discuss her new insurance, medicare.  She wants to consider switching therapy from Humira to infusions as she is not going to be able to afford her Humira.  You had a cancellation on Thursday, 10/31/16.  Patient scheduled appointment to come discuss changing medications.   Lilla Shookachel Henderson, Pharm.D., BCPS, CPP Clinical Pharmacist Pager: [phone] Phone: [phone] 10/29/2016 2:23 PM

## 2016-10-30 DIAGNOSIS — M5030 Other cervical disc degeneration, unspecified cervical region: Secondary | ICD-10-CM | POA: Insufficient documentation

## 2016-10-30 DIAGNOSIS — Z8669 Personal history of other diseases of the nervous system and sense organs: Secondary | ICD-10-CM | POA: Insufficient documentation

## 2016-10-30 DIAGNOSIS — I7300 Raynaud's syndrome without gangrene: Secondary | ICD-10-CM | POA: Insufficient documentation

## 2016-10-30 DIAGNOSIS — Z8719 Personal history of other diseases of the digestive system: Secondary | ICD-10-CM | POA: Insufficient documentation

## 2016-10-30 DIAGNOSIS — Z79899 Other long term (current) drug therapy: Secondary | ICD-10-CM | POA: Insufficient documentation

## 2016-10-30 NOTE — Progress Notes (Signed)
Office Visit Note  Patient: Amy Hayes             Date of Birth: [birth year]           MRN: [id number]             PCP: Ardelle Lesches, MD Referring: Ardelle Lesches, MD Visit Date: 10/31/2016 Occupation: @GUAROCC @    Subjective:  Pain hands   History of Present Illness: FLOETTA BRICKEY is a 71 y.o. female with history of psoriatic arthritis and psoriasis. She states she's been having some discomfort in her hands. She denies any joint swelling. There is no active psoriasis lesions. She's been in the process of moving and her lower back has been painful. She's been using some patches to relieve pain but she states she has constant pain in her lower back. Her cervical spine is doing all right. Patient states that standing straight makes her back pain worse and bending over and relieves the pain. Although she denies any radiculopathy.  Activities of Daily Living:  Patient reports morning stiffness for 1 hour.   Patient Denies nocturnal pain.  Difficulty dressing/grooming: Denies Difficulty climbing stairs: Denies Difficulty getting out of chair: Denies Difficulty using hands for taps, buttons, cutlery, and/or writing: Reports   Review of Systems  Constitutional: Negative.  Negative for fatigue.  HENT: Positive for mouth sores. Negative for mouth dryness.   Eyes: Positive for dryness.  Respiratory: Negative.  Negative for cough, shortness of breath and difficulty breathing.   Cardiovascular: Negative.  Negative for palpitations, hypertension and irregular heartbeat.  Gastrointestinal: Negative.  Negative for blood in stool, constipation and diarrhea.  Endocrine: Negative.   Genitourinary: Negative.  Negative for nocturia.  Musculoskeletal: Positive for arthralgias, joint pain, joint swelling and morning stiffness. Negative for myalgias, muscle weakness and myalgias.  Skin: Negative.  Negative for color change, rash, hair loss, ulcers and sensitivity to sunlight.  Neurological:  Negative.  Negative for headaches.  Hematological: Negative.  Negative for swollen glands.  Psychiatric/Behavioral: Negative.  Negative for depressed mood and sleep disturbance. The patient is not nervous/anxious.     PMFS History:  Patient Active Problem List   Diagnosis Date Noted  . DDD (degenerative disc disease), cervical s/p Fusion  10/30/2016  . Raynaud's disease without gangrene 10/30/2016  . History of migraine 10/30/2016  . History of gastroesophageal reflux (GERD) 10/30/2016  . High risk medication use 10/30/2016  . Psoriatic arthropathy (HCC) 05/02/2016  . Bilateral low back pain with sciatica 05/02/2016  . Osteopenia 05/02/2016    Past Medical History:  Diagnosis Date  . GERD (gastroesophageal reflux disease)   . Osteoporosis     History reviewed. No pertinent family history. Past Surgical History:  Procedure Laterality Date  . CARPAL TUNNEL RELEASE    . KNEE ARTHROSCOPY Right   . SPINAL FUSION     cervical fusion   Social History   Social History Narrative  . No narrative on file     Objective: Vital Signs: BP 139/71   Pulse 60   Resp 12   Ht 5' (1.524 m)   Wt 123 lb (55.8 kg)   BMI 24.02 kg/m    Physical Exam  Constitutional: She is oriented to person, place, and time. She appears well-developed and well-nourished.  HENT:  Head: Normocephalic and atraumatic.  Eyes: Conjunctivae and EOM are normal.  Neck: Normal range of motion.  Cardiovascular: Normal rate, regular rhythm, normal heart sounds and intact distal pulses.   Pulmonary/Chest: Effort normal  and breath sounds normal.  Abdominal: Soft. Bowel sounds are normal.  Lymphadenopathy:    She has no cervical adenopathy.  Neurological: She is alert and oriented to person, place, and time.  Skin: Skin is warm and dry. Capillary refill takes less than 2 seconds.  Psychiatric: She has a normal mood and affect. Her behavior is normal.  Nursing note and vitals reviewed.    Musculoskeletal Exam:  C-spine some limitation with range of motion. Lumbar spine painful range of motion. Shoulder joints although joints wrist joints are good range of motion. She has some synovitis in her PIP joints as described below. Hip joints knee joints ankles MTPs PIPs with good range of motion with no synovitis. She had no point tenderness over the lumbar spine region.  CDAI Exam: CDAI Homunculus Exam:   Tenderness:  Right hand: 3rd PIP and 4th PIP Left hand: 4th PIP  Swelling:  Right hand: 3rd PIP and 4th PIP Left hand: 4th PIP  Joint Counts:  CDAI Tender Joint count: 3 CDAI Swollen Joint count: 3  Global Assessments:  Patient Global Assessment: 6 Provider Global Assessment: 4  CDAI Calculated Score: 16    Investigation: No additional findings.   Imaging: Xr Lumbar Spine 2-3 Views  Result Date: 10/31/2016 Levoscoliosis with multilevel spondylosis and some SI joint sclerosis was noted. Severe narrowing between L2-3 L3-4 listhesis was noted. Facet joint arthropathy was noted.   Speciality Comments: No specialty comments available.    Procedures:  No procedures performed Allergies: Codeine and Iodine   Assessment / Plan:     Visit Diagnoses: Psoriatic arthropathy (HCC): She has mild synovitis in her hands. Which she relates to doing packing and unpacking and the moving process. She's been on Humira and her symptoms have been fairly well-controlled on it. Although due to insurance issues we will have to switch her to infusion medication. Different treatment options and their side effects were discussed today. After informed consent was obtained we decided to proceed with [name] Aria infusions. We'll apply for that. She will discontinue Humira 2 weeks prior to her infusion.  Other psoriasis: She has no active lesions  High risk medication use - Plan: Hepatitis B surface antigen, Hepatitis C antibody, Hepatitis B core antibody, IgM, HIV antibody, IgG, IgA, IgM, Protein electrophoresis,  serum. Her labs in June 2018 were normal which include CBC and CMP.  Osteopenia of multiple sites: Use of calcium and vitamin D was discussed.  Raynaud's disease without gangrene: Currently not active  DDD (degenerative disc disease), cervical s/p Fusion : She has minimal stiffness in her C-spine  Bilateral low back pain with sciatica, sciatica laterality unspecified, unspecified chronicity: She's been having increased lower back pain. I'll obtain x-ray of her lumbar spine today. The x-ray reveals multilevel spondylosis and listhesis. I advised patient if her symptoms get worse then she should notify us.  History of migraine  History of gastroesophageal reflux (GERD)    Orders: Orders Placed This Encounter  Procedures  . XR Lumbar Spine 2-3 Views  . Hepatitis B surface antigen  . Hepatitis C antibody  . Hepatitis B core antibody, IgM  . HIV antibody  . IgG, IgA, IgM  . Protein electrophoresis, serum   No orders of the defined types were placed in this encounter.   Face-to-face time spent with patient was 30 minutes. 50% of time was spent in counseling and coordination of care.  Follow-Up Instructions: Return in about 3 months (around 01/31/2017) for Psoriatic arthritis Psoriasis.   Janalyn RouseShaili  Estanislado Pandy, MD  Note - This record has been created using Editor, commissioning.  Chart creation errors have been sought, but may not always  have been located. Such creation errors do not reflect on  the standard of medical care.

## 2016-10-31 ENCOUNTER — Ambulatory Visit (INDEPENDENT_AMBULATORY_CARE_PROVIDER_SITE_OTHER): Payer: Medicare Other

## 2016-10-31 ENCOUNTER — Ambulatory Visit (INDEPENDENT_AMBULATORY_CARE_PROVIDER_SITE_OTHER): Payer: Medicare Other | Admitting: Rheumatology

## 2016-10-31 ENCOUNTER — Encounter: Payer: Self-pay | Admitting: Rheumatology

## 2016-10-31 ENCOUNTER — Telehealth: Payer: Self-pay

## 2016-10-31 VITALS — BP 139/71 | HR 60 | Resp 12 | Ht 60.0 in | Wt 123.0 lb

## 2016-10-31 DIAGNOSIS — M8589 Other specified disorders of bone density and structure, multiple sites: Secondary | ICD-10-CM | POA: Diagnosis not present

## 2016-10-31 DIAGNOSIS — L4050 Arthropathic psoriasis, unspecified: Secondary | ICD-10-CM | POA: Diagnosis not present

## 2016-10-31 DIAGNOSIS — Z79899 Other long term (current) drug therapy: Secondary | ICD-10-CM

## 2016-10-31 DIAGNOSIS — Z8669 Personal history of other diseases of the nervous system and sense organs: Secondary | ICD-10-CM

## 2016-10-31 DIAGNOSIS — M5440 Lumbago with sciatica, unspecified side: Secondary | ICD-10-CM

## 2016-10-31 DIAGNOSIS — M545 Low back pain: Secondary | ICD-10-CM

## 2016-10-31 DIAGNOSIS — G8929 Other chronic pain: Secondary | ICD-10-CM

## 2016-10-31 DIAGNOSIS — M5030 Other cervical disc degeneration, unspecified cervical region: Secondary | ICD-10-CM

## 2016-10-31 DIAGNOSIS — L408 Other psoriasis: Secondary | ICD-10-CM

## 2016-10-31 DIAGNOSIS — I7300 Raynaud's syndrome without gangrene: Secondary | ICD-10-CM

## 2016-10-31 DIAGNOSIS — Z8719 Personal history of other diseases of the digestive system: Secondary | ICD-10-CM

## 2016-10-31 NOTE — Telephone Encounter (Signed)
A benefits investigation for [name] Amy Hayes has been submitted via [employer]. Will update once a response has been received.   [name], [place]cliffhasta, CPhT 3:00 PM

## 2016-10-31 NOTE — Patient Instructions (Addendum)
Golimumab injection (subcutaneous or intravenous use) °What is this medicine? °GOLIMUMAB (goe LIM ue mab) is used to treat rheumatoid arthritis, psoriatic arthritis, and ankylosing spondylitis. It is also used to treat ulcerative colitis. °This medicine may be used for other purposes; ask your health care provider or pharmacist if you have questions. °COMMON BRAND NAME(S): [name], [name] ARIA °What should I tell my health care provider before I take this medicine? °They need to know if you have any of these conditions: °-cancer °-diabetes °-Guillain-Barre syndrome °-heart failure °-hepatitis B or history of hepatitis B infection °-immune system problems °-infection or history of infections °-low blood counts like low white cell, platelet, or red cell counts °-multiple sclerosis °-recently received or scheduled to receive a vaccine °-tuberculosis, a positive skin test for tuberculosis or have recently been in close contact with someone who has tuberculosis °-an unusual reaction to golimumab, other medicines, latex, rubber, foods, dyes, or preservatives °-pregnant or trying to get pregnant °-breast-feeding °How should I use this medicine? °This medicine is for infusion into a vein or for injection under the skin. Infusions are given by a health care professional in a hospital or clinic setting. If you are to give your own medicine at home, you will be taught how to prepare and give this medicine under the skin. Use exactly as directed. Take your medicine at regular intervals. Do not take your medicine more often than directed. °It is important that you put your used needles and syringes in a special sharps container. Do not put them in a trash can. If you do not have a sharps container, call your pharmacist or healthcare provider to get one. °A special MedGuide will be given to you by the pharmacist with each prescription and refill. Be sure to read this information carefully each time. °Talk to your pediatrician  regarding the use of this medicine in children. Special care may be needed. °Overdosage: If you think you have taken too much of this medicine contact a poison control center or emergency room at once. °NOTE: This medicine is only for you. Do not share this medicine with others. °What if I miss a dose? °If you give your medicine by injection under the skin: If you miss a dose, take it as soon as you can. If it is almost time for your next dose, take only that dose. Do not take double or extra doses. Call your doctor or health care professional if you are not sure how to handle a missed dose. °If you are to be given an infusion: It is important not to miss your dose. Call your doctor or health care professional if you are unable to keep an appointment. °What may interact with this medicine? °Do not take this medicine with any of the following medications: °-abatacept °-adalimumab °-anakinra °-certolizumab °-etanercept °-infliximab °-live virus vaccines °-rituximab °-rilonacept °-tocilizumab °This medicine may also interact with the following medications: °-cyclosporine °-theophylline °-tofacitinib °-vaccines °-warfarin °This list may not describe all possible interactions. Give your health care provider a list of all the medicines, herbs, non-prescription drugs, or dietary supplements you use. Also tell them if you smoke, drink alcohol, or use illegal drugs. Some items may interact with your medicine. °What should I watch for while using this medicine? °Visit your doctor or health care professional for regular checks on your progress. Tell your doctor or healthcare professional if your symptoms do not start to get better or if they get worse. °You will be tested for tuberculosis (TB) before you   start this medicine. If your doctor prescribes any medicine for TB, you should start taking the TB medicine before starting this medicine. Make sure to finish the full course of TB medicine. °Call your doctor or health care  professional if you get a cold or other infection while receiving this medicine. Do not treat yourself. This medicine may decrease your body's ability to fight infection. °Talk to your doctor about your risk of cancer. You may be more at risk for certain types of cancers if you take this medicine. °What side effects may I notice from receiving this medicine? °Side effects that you should report to your doctor or health care professional as soon as possible: °-allergic reactions like skin rash, itching or hives, swelling of the face, lips, or tongue °-breathing problems °-changes in vision °-chest pain °-joint or muscle pain °-mouth sores °-numbness or tingling in any part of your body °-red, scaly patches or raised bumps on the skin °-signs and symptoms of infection like fever or chills; cough; sore throat; pain or trouble passing urine °-signs and symptoms of liver injury like dark yellow or brown urine; general ill feeling or flu-like symptoms; light-colored stools; loss of appetite; nausea; right upper belly pain; unusually weak or tired; yellowing of the eyes or skin °-swelling of the legs or ankles °-swollen lymph nodes in the neck, underarm, or groin areas °-unexplained weight loss °-unusual bleeding or bruising °-unusually weak or tired °Side effects that usually do not require medical attention (report to your doctor or health care professional if they continue or are bothersome): °-dizziness °-increased blood pressure °-nausea °-redness, itching, swelling, or bruising at site where injected °-runny nose °This list may not describe all possible side effects. Call your doctor for medical advice about side effects. You may report side effects to FDA at 1-800-FDA-1088. °Where should I keep my medicine? °Infusions will be given in a hospital or clinic and will not be stored at home. °Storage for syringes given under the skin and stored at home: °Keep out of the reach of children. Store in the original container  in a refrigerator between 2 and 8 degrees C (36 and 46 degrees F). Keep this medicine in the original container. Protect from light. Do not freeze. Throw away any unused medicine after the expiration date. °NOTE: This sheet is a summary. It may not cover all possible information. If you have questions about this medicine, talk to your doctor, pharmacist, or health care provider. °© 2018 Elsevier/Gold Standard (2016-02-28 08:42:06) ° °

## 2016-10-31 NOTE — Progress Notes (Signed)
Pharmacy Note  Subjective: Patient presents today to [facility] to see Dr. Corliss Skainseveshwar.  Patient recently switched to [employer] insurance.  She is unable to afford her Humira anymore.  Discussed [employer] patient assistance program, but patient does not think she will qualify based on income.  She is interested in switching to infusions.  Decision was made to apply for [name] Aria.  Patient seen by the pharmacist for counseling on [name] Aria.    Objective: TB Test: negative (08/01/16) Hepatitis panel: ordered today HIV: ordered today SPEP: ordered today Immunoglobulins: ordered today  CBC    Component Value Date/Time   WBC 6.2 10/17/2016 1608   RBC 4.52 10/17/2016 1608   HGB 14.5 10/17/2016 1608   HCT 44.4 10/17/2016 1608   PLT 352 10/17/2016 1608   MCV 98.2 10/17/2016 1608   MCH 32.1 10/17/2016 1608   MCHC 32.7 10/17/2016 1608   RDW 13.6 10/17/2016 1608   LYMPHSABS 2,852 10/17/2016 1608   MONOABS 558 10/17/2016 1608   EOSABS 124 10/17/2016 1608   BASOSABS 62 10/17/2016 1608    Assessment/Plan:  Counseled patient that [name] is a TNF blocking agent.  Reviewed [name] dose of 2 mg/kg at week 0, 4, then every 8 weeks.  Counseled patient on purpose, proper use, and adverse effects of [name] Aria.  Reviewed the most common adverse effects including infection.  Discussed that there is the possibility of an increased risk of malignancy but it is not well understood if this increased risk is due to the medication or the disease state.  Advised patient to get yearly dermatology exams due to risk of skin cancer.  Reviewed the importance of regular labs while on [name] Aria therapy.  Counseled patient that [name] should be held prior to scheduled surgery.  Counseled patient to avoid live vaccines while on [name].  Advised patient to get annual influenza vaccine and the pneumococcal vaccine as needed.  Provided patient with medication education material and answered all  questions.  Patient voiced understanding.  Patient consented to [name].  Will upload consent into the media tab.  Will submit benefits investigation for Simponia Aria through patient's new insurance.    Patient is aware that [name] Aria cannot be started until at least two weeks after her most recent Humira.  Patient was given sample of Humira to use while [name] Aria benefits investigation is pending.  Drug name: Humira, Strength: 40 mg, Qty: 1, LOT: [id number], Exp.Date: 02/2018, Dosing instructions: Inject under the skin every 14 days.  The patient has been instructed regarding the correct time, dose, and frequency of taking this medication, including desired effects and most common side effects.    Lilla Shookachel Jeffrey Graefe, Pharm.D., BCPS Clinical Pharmacist Pager: [phone] Phone: [phone] 10/31/2016 12:40 PM

## 2016-11-01 LAB — IGG, IGA, IGM
IGA: 216 mg/dL (ref 81–463)
IGG (IMMUNOGLOBIN G), SERUM: 1136 mg/dL (ref 694–1618)
IgM, Serum: 53 mg/dL (ref 48–271)

## 2016-11-01 LAB — HEPATITIS B SURFACE ANTIGEN: Hepatitis B Surface Ag: NEGATIVE

## 2016-11-01 LAB — HIV ANTIBODY (ROUTINE TESTING W REFLEX): HIV 1&2 Ab, 4th Generation: NONREACTIVE

## 2016-11-01 LAB — HEPATITIS C ANTIBODY: HCV AB: NEGATIVE

## 2016-11-01 LAB — HEPATITIS B CORE ANTIBODY, IGM: HEP B C IGM: NONREACTIVE

## 2016-11-06 ENCOUNTER — Encounter: Payer: Self-pay | Admitting: Rheumatology

## 2016-11-06 LAB — PROTEIN ELECTROPHORESIS, SERUM
Albumin ELP: 4.1 g/dL (ref 3.8–4.8)
Alpha-1-Globulin: 0.3 g/dL (ref 0.2–0.3)
Alpha-2-Globulin: 0.8 g/dL (ref 0.5–0.9)
BETA 2: 0.3 g/dL (ref 0.2–0.5)
Beta Globulin: 0.6 g/dL (ref 0.4–0.6)
GAMMA GLOBULIN: 1.1 g/dL (ref 0.8–1.7)
Total Protein, Serum Electrophoresis: 7.3 g/dL (ref 6.1–8.1)

## 2016-11-06 NOTE — Progress Notes (Signed)
wnl

## 2016-11-07 NOTE — Telephone Encounter (Addendum)
Received a call from [employer]. Spoke to [place] to who verified the patient's Medicare ID. Her information was updated and will be processed. Will update once we receive a response.   Called back to verify our BAA form for the clinic. Spoke to WhittierAnita who states that our BAA will expire around mid-august. Someone should contact us to renew. They can be reached at [phone] to have them fax a copy of the form to be filled out. If we do not have an agreement by then, we can fax a signed consent from the patient to [phone].  Korin Setzler, Dennis Porthasta, CPhT 11:02 AM

## 2016-11-11 ENCOUNTER — Telehealth: Payer: Self-pay

## 2016-11-11 NOTE — Telephone Encounter (Signed)
Called [employer] to check the status of patients BIV for [name] Aria. Spoke to LindenBeatrice who states that the application has not been processed because the previous conversation was not escalated to continue with the process. She verified the patient's insurance information. The application was processed and we should have an answer within 24 to 48 hours. Will update once we receive a response.   Reference number: [id number] Phone number: [phone]  Abran DukeHopkins, Aidyn Sportsman, CPhT 4:55 PM

## 2016-11-13 NOTE — Telephone Encounter (Signed)
Received a fax from [employer] with the patient's BIV information. It states that the coordination of benefits coverage for simponi Jarold Songaria has been undisclosed because Medicare indicates that it is secondary coverage.   Called [employer] [phone]) to verify. Spoke with Prosperity who states that the patient's plan started on October 20, 2016. [employer] is her secondary coverage to Medicare. [employer] will pick up the balance of the copy's from Medicare for the services. No pre-certification is required.  Reference number: [id number]  Abran DukeHopkins, Keya Wynes, CPhT 4:36 PM

## 2016-11-15 ENCOUNTER — Telehealth: Payer: Self-pay | Admitting: Rheumatology

## 2016-11-15 NOTE — Telephone Encounter (Signed)
Please see other telephone encounter.

## 2016-11-15 NOTE — Telephone Encounter (Signed)
I spoke to Mr. And Mrs. Sharl MaKerr.  Reviewed benefits information.  They want to proceed with [name] Aria.  Patient took last Humira dose today.  Advised we should wait until 12/02/16 or after to start [name] Aria.    Patient also had questions about bill for labs.  I provided patient with phone number for [facility] billing [phone], option [phone]) and advised them to call them about the bill.    Can you place infusion orders and call patient with information on how to scheduled infusion?  She should not start [name] Aria until 12/02/16 or later.  Thanks!

## 2016-11-15 NOTE — Telephone Encounter (Signed)
Patients husband returning your message you sent re insurance.

## 2016-11-18 ENCOUNTER — Other Ambulatory Visit: Payer: Self-pay | Admitting: Radiology

## 2016-11-18 DIAGNOSIS — L4050 Arthropathic psoriasis, unspecified: Secondary | ICD-10-CM

## 2016-11-18 NOTE — Telephone Encounter (Signed)
Infusion orders are put in for patient CBC CMP Tylenol Benadryl appointments are up to date and follow up appointment  is scheduled TB gold not due yet, due on April 2019

## 2016-12-04 ENCOUNTER — Telehealth: Payer: Self-pay | Admitting: Pharmacist

## 2016-12-04 NOTE — Telephone Encounter (Signed)
I received a call from patient's husband asking when patient's [name] Aria infusion is scheduled.  I have advised that the infusion orders have been placed into the computer.  Patient must call the infusion center at [phone] to schedule the infusion.  Mr. Sharl MaKerr confirms it has been over two weeks since Mrs. Amy Hayes's most recent infusion.  He voiced understanding and denies any further questions.    Lilla Shookachel Henderson, Pharm.D., BCPS, CPP Clinical Pharmacist Pager: [phone] Phone: [phone] 12/04/2016 1:48 PM

## 2016-12-13 ENCOUNTER — Ambulatory Visit (HOSPITAL_COMMUNITY)
Admission: RE | Admit: 2016-12-13 | Discharge: 2016-12-13 | Disposition: A | Discharge status: Home / Self Care | Payer: Medicare Other | Source: Ambulatory Visit | Attending: Rheumatology | Admitting: Rheumatology

## 2016-12-13 DIAGNOSIS — L4050 Arthropathic psoriasis, unspecified: Secondary | ICD-10-CM | POA: Diagnosis not present

## 2016-12-13 LAB — COMPREHENSIVE METABOLIC PANEL
ALK PHOS: 60 U/L (ref 38–126)
ALT: 14 U/L (ref 14–54)
AST: 27 U/L (ref 15–41)
Albumin: 3.4 g/dL — ABNORMAL LOW (ref 3.5–5.0)
Anion gap: 8 (ref 5–15)
BUN: 22 mg/dL — AB (ref 6–20)
CALCIUM: 8.8 mg/dL — AB (ref 8.9–10.3)
CHLORIDE: 105 mmol/L (ref 101–111)
CO2: 23 mmol/L (ref 22–32)
CREATININE: 0.58 mg/dL (ref 0.44–1.00)
GFR calc Af Amer: >60 mL/min (ref >60)
GFR calc non Af Amer: >60 mL/min (ref >60)
GLUCOSE: 94 mg/dL (ref 65–99)
Potassium: 4.3 mmol/L (ref 3.5–5.1)
SODIUM: 136 mmol/L (ref 135–145)
Total Bilirubin: 0.6 mg/dL (ref 0.3–1.2)
Total Protein: 6.2 g/dL — ABNORMAL LOW (ref 6.5–8.1)

## 2016-12-13 LAB — CBC
HEMATOCRIT: 43.3 % (ref 36.0–46.0)
HEMOGLOBIN: 14.2 g/dL (ref 12.0–15.0)
MCH: 31.8 pg (ref 26.0–34.0)
MCHC: 32.8 g/dL (ref 30.0–36.0)
MCV: 97.1 fL (ref 78.0–100.0)
Platelets: 379 10*3/uL (ref 150–400)
RBC: 4.46 MIL/uL (ref 3.87–5.11)
RDW: 13.1 % (ref 11.5–15.5)
WBC: 7.1 10*3/uL (ref 4.0–10.5)

## 2016-12-13 MED ORDER — SODIUM CHLORIDE 0.9 % IV SOLN
2.0000 mg/kg | INTRAVENOUS | Status: DC
  Administered 2016-12-13: 107.5 mg via INTRAVENOUS
  Filled 2016-12-13: qty 8.6

## 2016-12-13 MED ORDER — DIPHENHYDRAMINE HCL 25 MG PO CAPS
25.0000 mg | ORAL_CAPSULE | ORAL | Status: DC
  Administered 2016-12-13: 25 mg via ORAL

## 2016-12-13 MED ORDER — ACETAMINOPHEN 325 MG PO TABS: 650.0000 mg | ORAL_TABLET | ORAL | Status: DC

## 2016-12-13 MED ORDER — DIPHENHYDRAMINE HCL 25 MG PO CAPS
ORAL_CAPSULE | ORAL | Status: AC
  Filled 2016-12-13: qty 1

## 2016-12-13 NOTE — Progress Notes (Signed)
Labs are stable.

## 2016-12-13 NOTE — Progress Notes (Signed)
CBC normal

## 2016-12-13 NOTE — Discharge Instructions (Signed)
Golimumab injection (subcutaneous or intravenous use) °What is this medicine? °GOLIMUMAB (goe LIM ue mab) is used to treat rheumatoid arthritis, psoriatic arthritis, and ankylosing spondylitis. It is also used to treat ulcerative colitis. °This medicine may be used for other purposes; ask your health care provider or pharmacist if you have questions. °COMMON BRAND NAME(S): [name], [name] ARIA °What should I tell my health care provider before I take this medicine? °They need to know if you have any of these conditions: °-cancer °-diabetes °-Guillain-Barre syndrome °-heart failure °-hepatitis B or history of hepatitis B infection °-immune system problems °-infection or history of infections °-low blood counts like low white cell, platelet, or red cell counts °-multiple sclerosis °-recently received or scheduled to receive a vaccine °-tuberculosis, a positive skin test for tuberculosis or have recently been in close contact with someone who has tuberculosis °-an unusual reaction to golimumab, other medicines, latex, rubber, foods, dyes, or preservatives °-pregnant or trying to get pregnant °-breast-feeding °How should I use this medicine? °This medicine is for infusion into a vein or for injection under the skin. Infusions are given by a health care professional in a hospital or clinic setting. If you are to give your own medicine at home, you will be taught how to prepare and give this medicine under the skin. Use exactly as directed. Take your medicine at regular intervals. Do not take your medicine more often than directed. °It is important that you put your used needles and syringes in a special sharps container. Do not put them in a trash can. If you do not have a sharps container, call your pharmacist or healthcare provider to get one. °A special MedGuide will be given to you by the pharmacist with each prescription and refill. Be sure to read this information carefully each time. °Talk to your pediatrician  regarding the use of this medicine in children. Special care may be needed. °Overdosage: If you think you have taken too much of this medicine contact a poison control center or emergency room at once. °NOTE: This medicine is only for you. Do not share this medicine with others. °What if I miss a dose? °If you give your medicine by injection under the skin: If you miss a dose, take it as soon as you can. If it is almost time for your next dose, take only that dose. Do not take double or extra doses. Call your doctor or health care professional if you are not sure how to handle a missed dose. °If you are to be given an infusion: It is important not to miss your dose. Call your doctor or health care professional if you are unable to keep an appointment. °What may interact with this medicine? °Do not take this medicine with any of the following medications: °-abatacept °-adalimumab °-anakinra °-certolizumab °-etanercept °-infliximab °-live virus vaccines °-rituximab °-rilonacept °-tocilizumab °This medicine may also interact with the following medications: °-cyclosporine °-theophylline °-tofacitinib °-vaccines °-warfarin °This list may not describe all possible interactions. Give your health care provider a list of all the medicines, herbs, non-prescription drugs, or dietary supplements you use. Also tell them if you smoke, drink alcohol, or use illegal drugs. Some items may interact with your medicine. °What should I watch for while using this medicine? °Visit your doctor or health care professional for regular checks on your progress. Tell your doctor or healthcare professional if your symptoms do not start to get better or if they get worse. °You will be tested for tuberculosis (TB) before you   start this medicine. If your doctor prescribes any medicine for TB, you should start taking the TB medicine before starting this medicine. Make sure to finish the full course of TB medicine. °Call your doctor or health care  professional if you get a cold or other infection while receiving this medicine. Do not treat yourself. This medicine may decrease your body's ability to fight infection. °Talk to your doctor about your risk of cancer. You may be more at risk for certain types of cancers if you take this medicine. °What side effects may I notice from receiving this medicine? °Side effects that you should report to your doctor or health care professional as soon as possible: °-allergic reactions like skin rash, itching or hives, swelling of the face, lips, or tongue °-breathing problems °-changes in vision °-chest pain °-joint or muscle pain °-mouth sores °-numbness or tingling in any part of your body °-red, scaly patches or raised bumps on the skin °-signs and symptoms of infection like fever or chills; cough; sore throat; pain or trouble passing urine °-signs and symptoms of liver injury like dark yellow or brown urine; general ill feeling or flu-like symptoms; light-colored stools; loss of appetite; nausea; right upper belly pain; unusually weak or tired; yellowing of the eyes or skin °-swelling of the legs or ankles °-swollen lymph nodes in the neck, underarm, or groin areas °-unexplained weight loss °-unusual bleeding or bruising °-unusually weak or tired °Side effects that usually do not require medical attention (report to your doctor or health care professional if they continue or are bothersome): °-dizziness °-increased blood pressure °-nausea °-redness, itching, swelling, or bruising at site where injected °-runny nose °This list may not describe all possible side effects. Call your doctor for medical advice about side effects. You may report side effects to FDA at 1-800-FDA-1088. °Where should I keep my medicine? °Infusions will be given in a hospital or clinic and will not be stored at home. °Storage for syringes given under the skin and stored at home: °Keep out of the reach of children. Store in the original container  in a refrigerator between 2 and 8 degrees C (36 and 46 degrees F). Keep this medicine in the original container. Protect from light. Do not freeze. Throw away any unused medicine after the expiration date. °NOTE: This sheet is a summary. It may not cover all possible information. If you have questions about this medicine, talk to your doctor, pharmacist, or health care provider. °© 2018 [employer]vier/[employer] (2016-02-28 08:42:06) ° °

## 2016-12-19 ENCOUNTER — Telehealth: Payer: Self-pay | Admitting: Rheumatology

## 2016-12-19 MED ORDER — CELECOXIB 200 MG PO CAPS: 200.0000 mg | ORAL_CAPSULE | Freq: Every day | ORAL | 0 refills | Status: DC

## 2016-12-19 NOTE — Telephone Encounter (Signed)
Last Visit: 10/31/16 Next Visit: 03/04/17 Labs: 12/13/16 Stable   Okay to refill per Dr. Corliss Skainseveshwar

## 2016-12-19 NOTE — Telephone Encounter (Signed)
Pharmacy calling for rf for Celebrex. Fax#[phone]

## 2016-12-23 ENCOUNTER — Other Ambulatory Visit: Payer: Self-pay | Admitting: Rheumatology

## 2016-12-26 ENCOUNTER — Telehealth: Payer: Self-pay | Admitting: Rheumatology

## 2016-12-26 ENCOUNTER — Other Ambulatory Visit: Payer: Self-pay | Admitting: Radiology

## 2016-12-26 DIAGNOSIS — L4050 Arthropathic psoriasis, unspecified: Secondary | ICD-10-CM

## 2016-12-26 NOTE — Telephone Encounter (Signed)
I placed infusion orders on July 30th, have redone them today, since those orders cannot  Be found, have asked Brett CanalesSteve to find out what happened to those orders. Called husband to advise.

## 2016-12-26 NOTE — Telephone Encounter (Signed)
Patients spouse calling requesting Amy get in touch with [facility] to give new order for patients infusion. Patient needs to rs second infusion. Please call patients husband to advise.

## 2016-12-26 NOTE — Progress Notes (Signed)
Placed next two infusion orders  Infusion orders are current for patient CBC CMP Tylenol Benadryl appointments are up to date and follow up appointment  is scheduled TB gold not due yet, due on 07/2017

## 2017-01-09 ENCOUNTER — Other Ambulatory Visit: Payer: Self-pay | Admitting: Rheumatology

## 2017-01-09 NOTE — Progress Notes (Signed)
Infusion orders are current for patient CBC CMP Tylenol Benadryl appointments are up to date and follow up appointment  is scheduled TB gold not due yet, due on April 2019

## 2017-01-13 ENCOUNTER — Telehealth: Payer: Self-pay | Admitting: Rheumatology

## 2017-01-13 MED ORDER — METHOTREXATE SODIUM CHEMO INJECTION 50 MG/2ML: INTRAMUSCULAR | 0 refills | Status: DC

## 2017-01-13 NOTE — Telephone Encounter (Signed)
Last Visit: 10/31/16 Next Visit: 03/04/17 Labs: 12/13/16 Stable  Okay to refill per Dr. Corliss Skains  Patient advised to check with PCP to find out when the last flu vaccination was.

## 2017-01-13 NOTE — Telephone Encounter (Signed)
Patient would also like to know when her last Flu shot was. If you could, call back to advise her.

## 2017-01-13 NOTE — Telephone Encounter (Signed)
Patients husband calling requesting a refill on patients MTX. Patient uses [facility] in [place] shops at [address].

## 2017-01-14 ENCOUNTER — Ambulatory Visit (HOSPITAL_COMMUNITY)
Admission: RE | Admit: 2017-01-14 | Discharge: 2017-01-14 | Disposition: A | Discharge status: Home / Self Care | Payer: Medicare Other | Source: Ambulatory Visit | Attending: Rheumatology | Admitting: Rheumatology

## 2017-01-14 DIAGNOSIS — L4050 Arthropathic psoriasis, unspecified: Secondary | ICD-10-CM | POA: Diagnosis not present

## 2017-01-14 MED ORDER — ACETAMINOPHEN 325 MG PO TABS: 650.0000 mg | ORAL_TABLET | ORAL | Status: DC

## 2017-01-14 MED ORDER — DIPHENHYDRAMINE HCL 25 MG PO CAPS
25.0000 mg | ORAL_CAPSULE | ORAL | Status: DC
  Administered 2017-01-14: 25 mg via ORAL

## 2017-01-14 MED ORDER — DIPHENHYDRAMINE HCL 25 MG PO CAPS
ORAL_CAPSULE | ORAL | Status: AC
  Filled 2017-01-14: qty 1

## 2017-01-14 MED ORDER — SODIUM CHLORIDE 0.9 % IV SOLN
2.0000 mg/kg | INTRAVENOUS | Status: DC
  Administered 2017-01-14: 107.5 mg via INTRAVENOUS
  Filled 2017-01-14: qty 8.6

## 2017-02-10 ENCOUNTER — Other Ambulatory Visit: Payer: Self-pay | Admitting: Rheumatology

## 2017-02-20 NOTE — Progress Notes (Signed)
Office Visit Note  Patient: Amy Hayes             Date of Birth: Jun 18, 1945           MRN: [id number]             PCP: Ardelle Lesches, MD Referring: Ardelle Lesches, MD Visit Date: 03/04/2017 Occupation: @GUAROCC @    Subjective:  Medication Management   History of Present Illness: Amy Hayes is a 71 y.o. female with history of psoriatic arthritis and psoriasis. She states she is in the process of moving and they are renovating their new home. Which is been harder on her joints. She's been having increased discomfort in her right thumb and her left ring finger. She's not having much discomfort in her C-spine and lumbar spine. None of the other joints are painful or swollen.  Activities of Daily Living:  Patient reports morning stiffness for 0 minute.   Patient Denies nocturnal pain.  Difficulty dressing/grooming: Denies Difficulty climbing stairs: Denies Difficulty getting out of chair: Denies Difficulty using hands for taps, buttons, cutlery, and/or writing: Reports   Review of Systems  Constitutional: Positive for fatigue. Negative for night sweats, weight gain, weight loss and weakness.  HENT: Negative for mouth sores, trouble swallowing, trouble swallowing, mouth dryness and nose dryness.   Eyes: Positive for dryness. Negative for pain, redness and visual disturbance.  Respiratory: Negative for cough, shortness of breath and difficulty breathing.   Cardiovascular: Negative for chest pain, palpitations, hypertension, irregular heartbeat and swelling in legs/feet.  Gastrointestinal: Negative for blood in stool, constipation and diarrhea.  Endocrine: Negative for increased urination.  Genitourinary: Negative for vaginal dryness.  Musculoskeletal: Positive for arthralgias, joint pain, joint swelling and morning stiffness. Negative for myalgias, muscle weakness, muscle tenderness and myalgias.  Skin: Negative for color change, rash, hair loss, skin tightness, ulcers and  sensitivity to sunlight.  Allergic/Immunologic: Negative for susceptible to infections.  Neurological: Negative for dizziness, memory loss and night sweats.  Hematological: Negative for swollen glands.  Psychiatric/Behavioral: Negative for depressed mood and sleep disturbance. The patient is not nervous/anxious.     PMFS History:  Patient Active Problem List   Diagnosis Date Noted  . Primary osteoarthritis of both hands 03/04/2017  . DDD (degenerative disc disease), cervical s/p Fusion  10/30/2016  . Raynaud's disease without gangrene 10/30/2016  . History of migraine 10/30/2016  . History of gastroesophageal reflux (GERD) 10/30/2016  . High risk medication use 10/30/2016  . Psoriatic arthropathy (HCC) 05/02/2016  . Bilateral low back pain with sciatica 05/02/2016  . Osteopenia 05/02/2016    Past Medical History:  Diagnosis Date  . GERD (gastroesophageal reflux disease)   . Osteoporosis     History reviewed. No pertinent family history. Past Surgical History:  Procedure Laterality Date  . CARPAL TUNNEL RELEASE    . KNEE ARTHROSCOPY Right   . SPINAL FUSION     cervical fusion   Social History   Social History Narrative  . Not on file     Objective: Vital Signs: BP 137/64 (BP Location: Left Arm, Patient Position: Sitting, Cuff Size: Small)   Pulse 64   Resp 14   Ht 5' (1.524 m)   Wt 123 lb (55.8 kg)   BMI 24.02 kg/m    Physical Exam  Constitutional: She is oriented to person, place, and time. She appears well-developed and well-nourished.  HENT:  Head: Normocephalic and atraumatic.  Eyes: Conjunctivae and EOM are normal.  Neck: Normal range of  motion.  Cardiovascular: Normal rate, regular rhythm, normal heart sounds and intact distal pulses.  Pulmonary/Chest: Effort normal and breath sounds normal.  Abdominal: Soft. Bowel sounds are normal.  Lymphadenopathy:    She has no cervical adenopathy.  Neurological: She is alert and oriented to person, place, and  time.  Skin: Skin is warm and dry. Capillary refill takes less than 2 seconds.  Psychiatric: She has a normal mood and affect. Her behavior is normal.  Nursing note and vitals reviewed.    Musculoskeletal Exam: C-spine limited range of motion with some discomfort. Lumbar spine good range of motion. Shoulder joints elbow joints are good range of motion. She has thickening of PIP/DIP joints bilaterally with some synovitis in the left fourth PIP joint. She has subluxation of the third digit DIP joint.  CDAI Exam: CDAI Homunculus Exam:   Tenderness:  Left hand: 4th PIP  Swelling:  Left hand: 4th PIP  Joint Counts:  CDAI Tender Joint count: 1 CDAI Swollen Joint count: 1  Global Assessments:  Patient Global Assessment: 7 Provider Global Assessment: 5  CDAI Calculated Score: 14    Investigation: No additional findings.TB Gold: 08/01/16 Negative  CBC Latest Ref Rng & Units 12/13/2016 10/17/2016 09/30/2016  WBC 4.0 - 10.5 K/uL 7.1 6.2 7.4  Hemoglobin 12.0 - 15.0 g/dL 91.414.2 78.214.5 95.614.7  Hematocrit 36.0 - 46.0 % 43.3 44.4 44.3  Platelets 150 - 400 K/uL 379 352 337   CMP Latest Ref Rng & Units 12/13/2016 10/17/2016 09/30/2016  Glucose 65 - 99 mg/dL 94 21(H56(L) 85  BUN 6 - 20 mg/dL 08(M22(H) 57(Q27(H) 22  Creatinine 0.44 - 1.00 mg/dL 4.690.58 6.29(B0.58(L) 2.840.74  Sodium 135 - 145 mmol/L 136 136 137  Potassium 3.5 - 5.1 mmol/L 4.3 4.4 5.5(H)  Chloride 101 - 111 mmol/L 105 98 100  CO2 22 - 32 mmol/L 23 26 25   Calcium 8.9 - 10.3 mg/dL 1.3(K8.8(L) 9.3 9.9  Total Protein 6.5 - 8.1 g/dL 6.2(L) 7.1 7.1  Total Bilirubin 0.3 - 1.2 mg/dL 0.6 0.6 0.5  Alkaline Phos 38 - 126 U/L 60 72 69  AST 15 - 41 U/L 27 22 20   ALT 14 - 54 U/L 14 14 13     Imaging: No results found.  Speciality Comments: [name] Aria infusions every 8 weeks, started on 11/2016 TB gold negative 08/01/16    Procedures:  No procedures performed Allergies: Codeine and Iodine   Assessment / Plan:     Visit Diagnoses: Psoriatic arthropathy (HCC): She  continues to have a lot of discomfort in her hands and stiffness. She's also in the moving process and has a lot of the stress on her hands as she is renovating her new home. She has some synovitis in her left fourth PIP joint. I offered injection which she declined.I'll give her prescription for Voltaren gel.  Psoriasis: She does not have any active psoriasis lesions but she does have dry hands. Use of moisturizer was discussed.  Osteoarthritis bilateral hands: She's been having increasing stiffness and pain in her hands. See subluxation of her right third finger DIP joint.I will give her a prescription for ring splint.  High risk medication use - MTX 0.8 ml sq q wk, folic acid 2mg  po qd, [name] Aria IV. Her labs have been stable. She will get her labs this monthwith her next infusion.  Osteopenia of multiple sites: She is on calcium and vitamin D.  Raynaud's disease without gangrene: Currently not very active.  DDD (degenerative disc disease), cervical s/p  Fusion : She has limited range of motion.  Bilateral low back pain with sciatica, sciatica laterality unspecified, unspecified chronicity: Doing better  History of gastroesophageal reflux (GERD)  History of migraine    Orders: No orders of the defined types were placed in this encounter.  Meds ordered this encounter  Medications  . diclofenac sodium (VOLTAREN) 1 % GEL    Sig: Apply 3 gm to 3 large joints up to 3 times a day.Dispense 3 tubes with 3 refills.    Dispense:  3 Tube    Refill:  1    Face-to-face time spent with patient was 30 minutes. Greater than 50% of time was spent in counseling and coordination of care.  Follow-Up Instructions: Return in about 4 months (around 07/02/2017), or March, for Psoriatic arthritis.   Pollyann Savoy, MD  Note - This record has been created using Animal nutritionist.  Chart creation errors have been sought, but may not always  have been located. Such creation errors do not reflect on    the standard of medical care.

## 2017-03-04 ENCOUNTER — Other Ambulatory Visit: Payer: Self-pay

## 2017-03-04 ENCOUNTER — Ambulatory Visit (INDEPENDENT_AMBULATORY_CARE_PROVIDER_SITE_OTHER): Payer: Medicare Other | Admitting: Rheumatology

## 2017-03-04 ENCOUNTER — Telehealth: Payer: Self-pay

## 2017-03-04 ENCOUNTER — Encounter: Payer: Self-pay | Admitting: Rheumatology

## 2017-03-04 VITALS — BP 137/64 | HR 64 | Resp 14 | Ht 60.0 in | Wt 123.0 lb

## 2017-03-04 DIAGNOSIS — I7300 Raynaud's syndrome without gangrene: Secondary | ICD-10-CM

## 2017-03-04 DIAGNOSIS — L4050 Arthropathic psoriasis, unspecified: Secondary | ICD-10-CM | POA: Diagnosis not present

## 2017-03-04 DIAGNOSIS — M8589 Other specified disorders of bone density and structure, multiple sites: Secondary | ICD-10-CM

## 2017-03-04 DIAGNOSIS — Z79899 Other long term (current) drug therapy: Secondary | ICD-10-CM

## 2017-03-04 DIAGNOSIS — M5440 Lumbago with sciatica, unspecified side: Secondary | ICD-10-CM

## 2017-03-04 DIAGNOSIS — M5030 Other cervical disc degeneration, unspecified cervical region: Secondary | ICD-10-CM

## 2017-03-04 DIAGNOSIS — L409 Psoriasis, unspecified: Secondary | ICD-10-CM | POA: Diagnosis not present

## 2017-03-04 DIAGNOSIS — M19042 Primary osteoarthritis, left hand: Secondary | ICD-10-CM

## 2017-03-04 DIAGNOSIS — M19041 Primary osteoarthritis, right hand: Secondary | ICD-10-CM

## 2017-03-04 DIAGNOSIS — Z8669 Personal history of other diseases of the nervous system and sense organs: Secondary | ICD-10-CM

## 2017-03-04 DIAGNOSIS — Z8719 Personal history of other diseases of the digestive system: Secondary | ICD-10-CM

## 2017-03-04 MED ORDER — DICLOFENAC SODIUM 1 % TD GEL
TRANSDERMAL | 1 refills | Status: DC
Start: 2017-03-04 — End: 2017-03-04

## 2017-03-04 MED ORDER — DICLOFENAC SODIUM 1 % TD GEL: TRANSDERMAL | 1 refills | Status: DC

## 2017-03-04 NOTE — Telephone Encounter (Signed)
A prior authorization for Voltaren Gel has been submitted to patients Medicare Plan via cover my meds. Will update once we receive a response.   Zeriyah Wain, [place], CPhT 2:38 PM

## 2017-03-04 NOTE — Telephone Encounter (Signed)
Received a confirmation from Cover My Meds regarding a prior authorization approval for Dicofenac Gel through 04/21/2018.    Reference number: [id number] Phone number: [phone]  Will send document to scan center.  Called patient to update. Left voicemail.   Zohair Epp 2:41 PM

## 2017-03-10 ENCOUNTER — Other Ambulatory Visit: Payer: Self-pay | Admitting: *Deleted

## 2017-03-10 NOTE — Progress Notes (Signed)
Infusion orders are current for patient CBC CMP Tylenol Benadryl appointments are up to date and follow up appointment  is scheduled TB gold not due yet.  

## 2017-03-11 ENCOUNTER — Ambulatory Visit (HOSPITAL_COMMUNITY)
Admission: RE | Admit: 2017-03-11 | Discharge: 2017-03-11 | Disposition: A | Discharge status: Home / Self Care | Payer: Medicare Other | Source: Ambulatory Visit | Attending: Rheumatology | Admitting: Rheumatology

## 2017-03-11 ENCOUNTER — Encounter: Payer: Self-pay | Admitting: Rheumatology

## 2017-03-11 DIAGNOSIS — L4050 Arthropathic psoriasis, unspecified: Secondary | ICD-10-CM | POA: Diagnosis not present

## 2017-03-11 LAB — CBC
HEMATOCRIT: 42.8 % (ref 36.0–46.0)
Hemoglobin: 14.4 g/dL (ref 12.0–15.0)
MCH: 32.8 pg (ref 26.0–34.0)
MCHC: 33.6 g/dL (ref 30.0–36.0)
MCV: 97.5 fL (ref 78.0–100.0)
PLATELETS: 342 10*3/uL (ref 150–400)
RBC: 4.39 MIL/uL (ref 3.87–5.11)
RDW: 13.3 % (ref 11.5–15.5)
WBC: 7.3 10*3/uL (ref 4.0–10.5)

## 2017-03-11 LAB — COMPREHENSIVE METABOLIC PANEL
ALT: 19 U/L (ref 14–54)
ANION GAP: 10 (ref 5–15)
AST: 29 U/L (ref 15–41)
Albumin: 3.6 g/dL (ref 3.5–5.0)
Alkaline Phosphatase: 64 U/L (ref 38–126)
BILIRUBIN TOTAL: 0.8 mg/dL (ref 0.3–1.2)
BUN: 18 mg/dL (ref 6–20)
CHLORIDE: 101 mmol/L (ref 101–111)
CO2: 24 mmol/L (ref 22–32)
Calcium: 9.4 mg/dL (ref 8.9–10.3)
Creatinine, Ser: 0.62 mg/dL (ref 0.44–1.00)
Glucose, Bld: 95 mg/dL (ref 65–99)
POTASSIUM: 4.1 mmol/L (ref 3.5–5.1)
Sodium: 135 mmol/L (ref 135–145)
TOTAL PROTEIN: 6.8 g/dL (ref 6.5–8.1)

## 2017-03-11 MED ORDER — SODIUM CHLORIDE 0.9 % IV SOLN
2.0000 mg/kg | INTRAVENOUS | Status: DC
  Administered 2017-03-11: 107.5 mg via INTRAVENOUS
  Filled 2017-03-11: qty 8.6

## 2017-03-11 MED ORDER — ACETAMINOPHEN 325 MG PO TABS: 650.0000 mg | ORAL_TABLET | ORAL | Status: DC

## 2017-03-11 MED ORDER — DIPHENHYDRAMINE HCL 25 MG PO CAPS
ORAL_CAPSULE | ORAL | Status: AC
  Filled 2017-03-11: qty 1

## 2017-03-11 MED ORDER — DIPHENHYDRAMINE HCL 25 MG PO CAPS
25.0000 mg | ORAL_CAPSULE | ORAL | Status: DC
  Administered 2017-03-11: 25 mg via ORAL

## 2017-03-12 ENCOUNTER — Other Ambulatory Visit: Payer: Self-pay | Admitting: Rheumatology

## 2017-03-12 ENCOUNTER — Other Ambulatory Visit: Payer: Self-pay

## 2017-03-12 MED ORDER — DICLOFENAC SODIUM 1 % TD GEL: TRANSDERMAL | 3 refills | Status: DC

## 2017-04-04 ENCOUNTER — Other Ambulatory Visit: Payer: Self-pay | Admitting: Rheumatology

## 2017-04-04 NOTE — Telephone Encounter (Signed)
Last Visit: 03/04/17 Next Visit: 07/24/17 Labs: 03/11/17 WNL   Okay to refill per Dr. Corliss Skainseveshwar

## 2017-04-10 ENCOUNTER — Other Ambulatory Visit: Payer: Self-pay | Admitting: Rheumatology

## 2017-04-10 NOTE — Telephone Encounter (Signed)
Last Visit: 03/09/17 Next Visit: 07/24/17 Labs: 03/11/17 WNL  Okay to refill per Dr. Corliss Skainseveshwar

## 2017-04-27 ENCOUNTER — Encounter: Payer: Self-pay | Admitting: Rheumatology

## 2017-04-27 ENCOUNTER — Other Ambulatory Visit: Payer: Self-pay | Admitting: Rheumatology

## 2017-04-28 MED ORDER — "TUBERCULIN SYRINGE 27G X 1/2"" 1 ML MISC": 6 refills | Status: DC

## 2017-04-28 NOTE — Telephone Encounter (Signed)
Last Visit: 03/09/17 Next Visit: 07/24/17 Labs: 03/11/17 WNL  Okay to refill per Dr. Deveshwar 

## 2017-04-29 ENCOUNTER — Telehealth: Payer: Self-pay | Admitting: Rheumatology

## 2017-04-29 DIAGNOSIS — M79641 Pain in right hand: Secondary | ICD-10-CM

## 2017-04-29 NOTE — Telephone Encounter (Signed)
PT Order placed. Amy Hayes will fax.

## 2017-04-29 NOTE — Telephone Encounter (Signed)
PT calling request order to be sent over. Fax# [phone]

## 2017-05-08 ENCOUNTER — Other Ambulatory Visit: Payer: Self-pay | Admitting: *Deleted

## 2017-05-08 NOTE — Progress Notes (Signed)
Infusion orders are current for patient CBC CMP Tylenol Benadryl appointments are up to date and follow up appointment  is scheduled TB gold not due yet.  

## 2017-05-13 ENCOUNTER — Ambulatory Visit (HOSPITAL_COMMUNITY)
Admission: RE | Admit: 2017-05-13 | Discharge: 2017-05-13 | Disposition: A | Discharge status: Home / Self Care | Payer: Medicare Other | Source: Ambulatory Visit | Attending: Rheumatology | Admitting: Rheumatology

## 2017-05-13 DIAGNOSIS — L4050 Arthropathic psoriasis, unspecified: Secondary | ICD-10-CM | POA: Diagnosis not present

## 2017-05-13 LAB — COMPREHENSIVE METABOLIC PANEL
ALBUMIN: 3.2 g/dL — AB (ref 3.5–5.0)
ALT: 16 U/L (ref 14–54)
AST: 31 U/L (ref 15–41)
Alkaline Phosphatase: 66 U/L (ref 38–126)
Anion gap: 13 (ref 5–15)
BUN: 17 mg/dL (ref 6–20)
CHLORIDE: 102 mmol/L (ref 101–111)
CO2: 21 mmol/L — ABNORMAL LOW (ref 22–32)
Calcium: 9.3 mg/dL (ref 8.9–10.3)
Creatinine, Ser: 0.64 mg/dL (ref 0.44–1.00)
GFR calc Af Amer: >60 mL/min (ref >60)
GFR calc non Af Amer: >60 mL/min (ref >60)
GLUCOSE: 91 mg/dL (ref 65–99)
POTASSIUM: 4.4 mmol/L (ref 3.5–5.1)
Sodium: 136 mmol/L (ref 135–145)
Total Bilirubin: 0.6 mg/dL (ref 0.3–1.2)
Total Protein: 6.1 g/dL — ABNORMAL LOW (ref 6.5–8.1)

## 2017-05-13 LAB — CBC
HCT: 40.3 % (ref 36.0–46.0)
Hemoglobin: 13 g/dL (ref 12.0–15.0)
MCH: 31.2 pg (ref 26.0–34.0)
MCHC: 32.3 g/dL (ref 30.0–36.0)
MCV: 96.6 fL (ref 78.0–100.0)
PLATELETS: 294 10*3/uL (ref 150–400)
RBC: 4.17 MIL/uL (ref 3.87–5.11)
RDW: 12.9 % (ref 11.5–15.5)
WBC: 6.3 10*3/uL (ref 4.0–10.5)

## 2017-05-13 MED ORDER — ACETAMINOPHEN 325 MG PO TABS: 650.0000 mg | ORAL_TABLET | ORAL | Status: DC

## 2017-05-13 MED ORDER — DIPHENHYDRAMINE HCL 25 MG PO CAPS: 25.0000 mg | ORAL_CAPSULE | ORAL | Status: DC

## 2017-05-13 MED ORDER — SODIUM CHLORIDE 0.9 % IV SOLN
2.0000 mg/kg | INTRAVENOUS | Status: DC
  Administered 2017-05-13: 107.5 mg via INTRAVENOUS
  Filled 2017-05-13: qty 8.6

## 2017-05-13 NOTE — Progress Notes (Signed)
Labs are stable.

## 2017-06-20 ENCOUNTER — Encounter: Payer: Self-pay | Admitting: Rheumatology

## 2017-06-23 ENCOUNTER — Telehealth: Payer: Self-pay

## 2017-06-23 MED ORDER — CELECOXIB 200 MG PO CAPS: 200.0000 mg | ORAL_CAPSULE | Freq: Every day | ORAL | 0 refills | Status: DC

## 2017-06-23 MED ORDER — LIDOCAINE 5 % EX PTCH: MEDICATED_PATCH | CUTANEOUS | 2 refills | Status: DC

## 2017-06-23 NOTE — Telephone Encounter (Signed)
Received a prior authorization request for Lidoderm patches from [facility]. Authorization was submitted to pts insurance via cover my meds. Will update once we receive a response.   Caramia Boutin, [place], CPhT 2:04 PM

## 2017-06-23 NOTE — Telephone Encounter (Signed)
Last Visit: 03/09/17 Next Visit: 07/24/17 Labs: 05/13/17 Stable  Okay to refill per Dr. Corliss Skainseveshwar

## 2017-06-25 NOTE — Telephone Encounter (Signed)
[facility] regarding a prior authorization DENIAL for LIDOCAINE PATCH 5% because it is not supported by the FDA or by one of the Medicare approved references for treating Degenerative disc disease.   Reference number:[id number] Phone number: [phone]  Will send document to scan center.  Called pt to update. Home phone busy. Mobile phone did not have a voicemail set up. Could not leave a message.   8:55 AM

## 2017-07-11 NOTE — Progress Notes (Signed)
Office Visit Note  Patient: Amy Hayes             Date of Birth: 03/21/1946           MRN: [id number]             PCP: Milda Smart, MD Referring: Ardelle Lesches, MD Visit Date: 07/24/2017 Occupation: @GUAROCC @    Subjective:  Pain of the Lower Back and Follow-up (HAVING BACK PAIN ALL DAY, UNLESS SITTING)   History of Present Illness: Amy Hayes is a 72 y.o. female with history of psoriatic arthritis psoriasis, osteoarthritis and DDD.  She continues to have some pain and swelling in her hands.  Overall she is doing better with methotrexate and [name] Aria combination.  She continues to have pain and discomfort in the lower back.  She states she has difficult discomfort doing routine activities and walking due to lower back pain.  The neck is doing all right.She has no active psoriasis.  Activities of Daily Living:  Patient reports morning stiffness for 8 hours.   Patient Denies nocturnal pain.  Difficulty dressing/grooming: Denies Difficulty climbing stairs: Reports Difficulty getting out of chair: Denies Difficulty using hands for taps, buttons, cutlery, and/or writing: Reports   Review of Systems  Constitutional: Negative for fatigue, fever, night sweats, weight gain and weight loss.  HENT: Negative for ear pain, mouth sores, trouble swallowing, trouble swallowing, mouth dryness and nose dryness.   Eyes: Negative for pain, redness, visual disturbance and dryness.  Respiratory: Negative for cough, shortness of breath and difficulty breathing.   Cardiovascular: Negative for chest pain, palpitations, hypertension, irregular heartbeat and swelling in legs/feet.  Gastrointestinal: Negative for blood in stool, constipation and diarrhea.  Endocrine: Negative for increased urination.  Genitourinary: Negative for difficulty urinating and vaginal dryness.  Musculoskeletal: Positive for arthralgias, joint pain, myalgias and myalgias. Negative for joint swelling, muscle  weakness, morning stiffness and muscle tenderness.  Skin: Negative for color change, rash, hair loss, skin tightness, ulcers and sensitivity to sunlight.  Allergic/Immunologic: Negative for susceptible to infections.  Neurological: Negative for dizziness, numbness, headaches, memory loss, night sweats and weakness.  Hematological: Negative for bruising/bleeding tendency and swollen glands.  Psychiatric/Behavioral: Negative for depressed mood and sleep disturbance. The patient is not nervous/anxious.     PMFS History:  Patient Active Problem List   Diagnosis Date Noted  . DDD (degenerative disc disease), lumbar 07/24/2017  . Primary osteoarthritis of both hands 03/04/2017  . DDD (degenerative disc disease), cervical s/p Fusion  10/30/2016  . Raynaud's disease without gangrene 10/30/2016  . History of migraine 10/30/2016  . History of gastroesophageal reflux (GERD) 10/30/2016  . High risk medication use 10/30/2016  . Psoriasis 05/02/2016  . Bilateral low back pain with sciatica 05/02/2016  . Osteopenia 05/02/2016    Past Medical History:  Diagnosis Date  . GERD (gastroesophageal reflux disease)   . Osteoporosis     History reviewed. No pertinent family history. Past Surgical History:  Procedure Laterality Date  . ABDOMINAL HYSTERECTOMY    . CARPAL TUNNEL RELEASE    . CERVICAL FUSION    . EYE SURGERY    . KNEE ARTHROSCOPY Right   . KNEE CARTILAGE SURGERY    . ROTATOR CUFF REPAIR    . SPINAL FUSION     cervical fusion   Social History   Social History Narrative  . Not on file     Objective: Vital Signs: BP (!) 118/58 (BP Location: Left Arm, Patient Position: Sitting,  Cuff Size: Normal)   Pulse (!) 59   Resp 16   Ht 5' (1.524 m)   Wt 123 lb (55.8 kg)   BMI 24.02 kg/m    Physical Exam  Constitutional: She is oriented to person, place, and time. She appears well-developed and well-nourished.  HENT:  Head: Normocephalic and atraumatic.  Eyes: Conjunctivae and EOM  are normal.  Neck: Normal range of motion.  Cardiovascular: Normal rate, regular rhythm, normal heart sounds and intact distal pulses.  Pulmonary/Chest: Effort normal and breath sounds normal.  Abdominal: Soft. Bowel sounds are normal.  Lymphadenopathy:    She has no cervical adenopathy.  Neurological: She is alert and oriented to person, place, and time.  Skin: Skin is warm and dry. Capillary refill takes less than 2 seconds.  Psychiatric: She has a normal mood and affect. Her behavior is normal.  Nursing note and vitals reviewed.    Musculoskeletal Exam: C-spine limited range of motion without discomfort.  She had some discomfort range of motion of her lumbar spine especially with extension.  Shoulder joints, elbow joints are good range of motion.  She has DIP PIP thickening with synovitis and some of her PIPs as described below.  Hip joints, knee joints, ankles and MTPs were in good range of motion with no synovitis.  CDAI Exam: CDAI Homunculus Exam:   Tenderness:  Left hand: 3rd PIP and 4th PIP  Swelling:  Left hand: 3rd PIP and 4th PIP  Joint Counts:  CDAI Tender Joint count: 2 CDAI Swollen Joint count: 2  Global Assessments:  Patient Global Assessment: 2 Provider Global Assessment: 2  CDAI Calculated Score: 8    Investigation: No additional findings.TB Gold: 08/01/2016 Negative  CBC Latest Ref Rng & Units 07/22/2017 05/13/2017 03/11/2017  WBC 4.0 - 10.5 K/uL 5.2 6.3 7.3  Hemoglobin 12.0 - 15.0 g/dL 16.113.6 09.613.0 04.514.4  Hematocrit 36.0 - 46.0 % 41.4 40.3 42.8  Platelets 150 - 400 K/uL 268 294 342   CMP Latest Ref Rng & Units 07/22/2017 05/13/2017 03/11/2017  Glucose 65 - 99 mg/dL 93 91 95  BUN 6 - 20 mg/dL 40(J21(H) 17 18  Creatinine 0.44 - 1.00 mg/dL 8.110.66 9.140.64 7.820.62  Sodium 135 - 145 mmol/L 133(L) 136 135  Potassium 3.5 - 5.1 mmol/L 4.3 4.4 4.1  Chloride 101 - 111 mmol/L 102 102 101  CO2 22 - 32 mmol/L 22 21(L) 24  Calcium 8.9 - 10.3 mg/dL 9.1 9.3 9.4  Total Protein 6.5 -  8.1 g/dL 6.5 6.1(L) 6.8  Total Bilirubin 0.3 - 1.2 mg/dL 0.6 0.6 0.8  Alkaline Phos 38 - 126 U/L 68 66 64  AST 15 - 41 U/L 30 31 29   ALT 14 - 54 U/L 17 16 19     Imaging: No results found.  Speciality Comments: [name] Aria infusions every 8 weeks, started on 11/2016 TB gold negative 08/01/16    Procedures:  No procedures performed Allergies: Codeine and Iodine   Assessment / Plan:     Visit Diagnoses: Psoriatic arthropathy (HCC): Patient still has mild synovitis in her PIP joints.  Overall she is doing much better on methotrexate and [name] combination.  Psoriasis: She has no active lesions.  High risk medication use - MTX 0.8 ml sq q wk, folic acid 2mg  po qd, [name] Aria IV. (Humira -discontinued due to insurance coverage).  Her labs have been stable.  We will continue to monitor labs every 3 months.  Primary osteoarthritis of both hands: Joint protection muscle strengthening was  discussed.  Osteopenia of multiple sites: She is on calcium and vitamin D.  Raynaud's disease without gangrene: Currently not active.  DDD (degenerative disc disease), cervical: Doing better after C-spine fusion.  DDD (degenerative disc disease), lumbar she has been having increased pain and discomfort in the lumbar region.  She has been lifting boxes because of recent move.  I have given her a handout on back exercises.  History of migraine  History of gastroesophageal reflux (GERD)    Orders: No orders of the defined types were placed in this encounter.  No orders of the defined types were placed in this encounter.   Face-to-face time spent with patient was 30 minutes.  Greater than 50% of time was spent in counseling and coordination of care.  Follow-Up Instructions: Return in about 5 months (around 12/24/2017) for Psoriatic arthritis, psoriasis, DDD, OA.   Pollyann Savoy, MD  Note - This record has been created using Animal nutritionist.  Chart creation errors have been sought, but may  not always  have been located. Such creation errors do not reflect on  the standard of medical care.

## 2017-07-18 ENCOUNTER — Other Ambulatory Visit: Payer: Self-pay | Admitting: *Deleted

## 2017-07-18 NOTE — Progress Notes (Signed)
Infusion orders are current for patient CBC CMP Tylenol Benadryl appointments are up to date and follow up appointment  is scheduled TB gold is due and order placed.  

## 2017-07-22 ENCOUNTER — Ambulatory Visit (HOSPITAL_COMMUNITY)
Admission: RE | Admit: 2017-07-22 | Discharge: 2017-07-22 | Disposition: A | Discharge status: Home / Self Care | Payer: Medicare Other | Source: Ambulatory Visit | Attending: Rheumatology | Admitting: Rheumatology

## 2017-07-22 ENCOUNTER — Encounter (HOSPITAL_COMMUNITY): Payer: Medicare Other

## 2017-07-22 DIAGNOSIS — L4050 Arthropathic psoriasis, unspecified: Secondary | ICD-10-CM

## 2017-07-22 LAB — COMPREHENSIVE METABOLIC PANEL
ALBUMIN: 3.6 g/dL (ref 3.5–5.0)
ALT: 17 U/L (ref 14–54)
ANION GAP: 9 (ref 5–15)
AST: 30 U/L (ref 15–41)
Alkaline Phosphatase: 68 U/L (ref 38–126)
BUN: 21 mg/dL — ABNORMAL HIGH (ref 6–20)
CALCIUM: 9.1 mg/dL (ref 8.9–10.3)
CHLORIDE: 102 mmol/L (ref 101–111)
CO2: 22 mmol/L (ref 22–32)
Creatinine, Ser: 0.66 mg/dL (ref 0.44–1.00)
GFR calc non Af Amer: >60 mL/min (ref >60)
GLUCOSE: 93 mg/dL (ref 65–99)
POTASSIUM: 4.3 mmol/L (ref 3.5–5.1)
SODIUM: 133 mmol/L — AB (ref 135–145)
Total Bilirubin: 0.6 mg/dL (ref 0.3–1.2)
Total Protein: 6.5 g/dL (ref 6.5–8.1)

## 2017-07-22 LAB — CBC
HCT: 41.4 % (ref 36.0–46.0)
Hemoglobin: 13.6 g/dL (ref 12.0–15.0)
MCH: 31.3 pg (ref 26.0–34.0)
MCHC: 32.9 g/dL (ref 30.0–36.0)
MCV: 95.4 fL (ref 78.0–100.0)
Platelets: 268 10*3/uL (ref 150–400)
RBC: 4.34 MIL/uL (ref 3.87–5.11)
RDW: 13.8 % (ref 11.5–15.5)
WBC: 5.2 10*3/uL (ref 4.0–10.5)

## 2017-07-22 MED ORDER — ACETAMINOPHEN 325 MG PO TABS
ORAL_TABLET | ORAL | Status: AC
  Filled 2017-07-22: qty 2

## 2017-07-22 MED ORDER — DIPHENHYDRAMINE HCL 25 MG PO CAPS
ORAL_CAPSULE | ORAL | Status: DC
Start: 2017-07-22 — End: 2017-07-23
  Filled 2017-07-22: qty 1

## 2017-07-22 MED ORDER — SODIUM CHLORIDE 0.9 % IV SOLN
2.0000 mg/kg | INTRAVENOUS | Status: AC
  Administered 2017-07-22: 107.5 mg via INTRAVENOUS
  Filled 2017-07-22: qty 8.6

## 2017-07-22 MED ORDER — DIPHENHYDRAMINE HCL 25 MG PO CAPS
25.0000 mg | ORAL_CAPSULE | ORAL | Status: DC
  Administered 2017-07-22: 25 mg via ORAL

## 2017-07-22 MED ORDER — ACETAMINOPHEN 325 MG PO TABS: 650.0000 mg | ORAL_TABLET | ORAL | Status: DC

## 2017-07-22 NOTE — Progress Notes (Signed)
Labs are stable.

## 2017-07-24 ENCOUNTER — Ambulatory Visit (INDEPENDENT_AMBULATORY_CARE_PROVIDER_SITE_OTHER): Payer: Medicare Other | Admitting: Rheumatology

## 2017-07-24 ENCOUNTER — Encounter: Payer: Self-pay | Admitting: Rheumatology

## 2017-07-24 VITALS — BP 118/58 | HR 59 | Resp 16 | Ht 60.0 in | Wt 123.0 lb

## 2017-07-24 DIAGNOSIS — I7300 Raynaud's syndrome without gangrene: Secondary | ICD-10-CM

## 2017-07-24 DIAGNOSIS — Z8719 Personal history of other diseases of the digestive system: Secondary | ICD-10-CM | POA: Diagnosis not present

## 2017-07-24 DIAGNOSIS — M19042 Primary osteoarthritis, left hand: Secondary | ICD-10-CM

## 2017-07-24 DIAGNOSIS — Z8669 Personal history of other diseases of the nervous system and sense organs: Secondary | ICD-10-CM | POA: Diagnosis not present

## 2017-07-24 DIAGNOSIS — M19041 Primary osteoarthritis, right hand: Secondary | ICD-10-CM

## 2017-07-24 DIAGNOSIS — L4050 Arthropathic psoriasis, unspecified: Secondary | ICD-10-CM | POA: Diagnosis not present

## 2017-07-24 DIAGNOSIS — M5030 Other cervical disc degeneration, unspecified cervical region: Secondary | ICD-10-CM | POA: Diagnosis not present

## 2017-07-24 DIAGNOSIS — M51369 Other intervertebral disc degeneration, lumbar region without mention of lumbar back pain or lower extremity pain: Secondary | ICD-10-CM | POA: Insufficient documentation

## 2017-07-24 DIAGNOSIS — M8589 Other specified disorders of bone density and structure, multiple sites: Secondary | ICD-10-CM | POA: Diagnosis not present

## 2017-07-24 DIAGNOSIS — L409 Psoriasis, unspecified: Secondary | ICD-10-CM | POA: Diagnosis not present

## 2017-07-24 DIAGNOSIS — Z79899 Other long term (current) drug therapy: Secondary | ICD-10-CM | POA: Diagnosis not present

## 2017-07-24 DIAGNOSIS — M5136 Other intervertebral disc degeneration, lumbar region: Secondary | ICD-10-CM

## 2017-07-24 LAB — QUANTIFERON-TB GOLD PLUS (RQFGPL)
QuantiFERON Mitogen Value: >10 IU/mL
QuantiFERON Nil Value: 0.05 IU/mL
QuantiFERON TB1 Ag Value: 0.04 IU/mL
QuantiFERON TB2 Ag Value: 0.04 IU/mL

## 2017-07-24 LAB — QUANTIFERON-TB GOLD PLUS: QUANTIFERON-TB GOLD PLUS: NEGATIVE

## 2017-07-24 NOTE — Patient Instructions (Signed)
Standing Labs We placed an order today for your standing lab work.    Please come back and get your standing labs in July and every 3 months Back Exercises The following exercises strengthen the muscles that help to support the back. They also help to keep the lower back flexible. Doing these exercises can help to prevent back pain or lessen existing pain. If you have back pain or discomfort, try doing these exercises 2-3 times each day or as told by your health care provider. When the pain goes away, do them once each day, but increase the number of times that you repeat the steps for each exercise (do more repetitions). If you do not have back pain or discomfort, do these exercises once each day or as told by your health care provider. Exercises Single Knee to Chest  Repeat these steps 3-5 times for each leg: 1. Lie on your back on a firm bed or the floor with your legs extended. 2. Bring one knee to your chest. Your other leg should stay extended and in contact with the floor. 3. Hold your knee in place by grabbing your knee or thigh. 4. Pull on your knee until you feel a gentle stretch in your lower back. 5. Hold the stretch for 10-30 seconds. 6. Slowly release and straighten your leg.  Pelvic Tilt  Repeat these steps 5-10 times: 1. Lie on your back on a firm bed or the floor with your legs extended. 2. Bend your knees so they are pointing toward the ceiling and your feet are flat on the floor. 3. Tighten your lower abdominal muscles to press your lower back against the floor. This motion will tilt your pelvis so your tailbone points up toward the ceiling instead of pointing to your feet or the floor. 4. With gentle tension and even breathing, hold this position for 5-10 seconds.  Cat-Cow  Repeat these steps until your lower back becomes more flexible: 1. Get into a hands-and-knees position on a firm surface. Keep your hands under your shoulders, and keep your knees under your hips.  You may place padding under your knees for comfort. 2. Let your head hang down, and point your tailbone toward the floor so your lower back becomes rounded like the back of a cat. 3. Hold this position for 5 seconds. 4. Slowly lift your head and point your tailbone up toward the ceiling so your back forms a sagging arch like the back of a cow. 5. Hold this position for 5 seconds.  Press-Ups  Repeat these steps 5-10 times: 1. Lie on your abdomen (face-down) on the floor. 2. Place your palms near your head, about shoulder-width apart. 3. While you keep your back as relaxed as possible and keep your hips on the floor, slowly straighten your arms to raise the top half of your body and lift your shoulders. Do not use your back muscles to raise your upper torso. You may adjust the placement of your hands to make yourself more comfortable. 4. Hold this position for 5 seconds while you keep your back relaxed. 5. Slowly return to lying flat on the floor.  Bridges  Repeat these steps 10 times: 1. Lie on your back on a firm surface. 2. Bend your knees so they are pointing toward the ceiling and your feet are flat on the floor. 3. Tighten your buttocks muscles and lift your buttocks off of the floor until your waist is at almost the same height as your knees.  You should feel the muscles working in your buttocks and the back of your thighs. If you do not feel these muscles, slide your feet 1-2 inches farther away from your buttocks. 4. Hold this position for 3-5 seconds. 5. Slowly lower your hips to the starting position, and allow your buttocks muscles to relax completely.  If this exercise is too easy, try doing it with your arms crossed over your chest. Abdominal Crunches  Repeat these steps 5-10 times: 1. Lie on your back on a firm bed or the floor with your legs extended. 2. Bend your knees so they are pointing toward the ceiling and your feet are flat on the floor. 3. Cross your arms over your  chest. 4. Tip your chin slightly toward your chest without bending your neck. 5. Tighten your abdominal muscles and slowly raise your trunk (torso) high enough to lift your shoulder blades a tiny bit off of the floor. Avoid raising your torso higher than that, because it can put too much stress on your low back and it does not help to strengthen your abdominal muscles. 6. Slowly return to your starting position.  Back Lifts Repeat these steps 5-10 times: 1. Lie on your abdomen (face-down) with your arms at your sides, and rest your forehead on the floor. 2. Tighten the muscles in your legs and your buttocks. 3. Slowly lift your chest off of the floor while you keep your hips pressed to the floor. Keep the back of your head in line with the curve in your back. Your eyes should be looking at the floor. 4. Hold this position for 3-5 seconds. 5. Slowly return to your starting position.  Contact a health care provider if:  Your back pain or discomfort gets much worse when you do an exercise.  Your back pain or discomfort does not lessen within 2 hours after you exercise. If you have any of these problems, stop doing these exercises right away. Do not do them again unless your health care provider says that you can. Get help right away if:  You develop sudden, severe back pain. If this happens, stop doing the exercises right away. Do not do them again unless your health care provider says that you can. This information is not intended to replace advice given to you by your health care provider. Make sure you discuss any questions you have with your health care provider. Document Released: 05/16/2004 Document Revised: 08/16/2015 Document Reviewed: 06/02/2014 Elsevier Interactive Patient Education  2017 [employer].   We have open lab Monday through Friday from 8:30-11:30 AM and 1:30-4:00 PM  at the office of Dr. Pollyann Savoy.   You may experience shorter wait times on Monday and Friday  afternoons. The office is located at [address], [address], [place], [place] [address] No appointment is necessary.   Labs are drawn by [facility].  You may receive a bill from [facility] for your lab work. If you have any questions regarding directions or hours of operation,  please call [phone].

## 2017-09-08 ENCOUNTER — Other Ambulatory Visit: Payer: Self-pay | Admitting: *Deleted

## 2017-09-08 DIAGNOSIS — L4050 Arthropathic psoriasis, unspecified: Secondary | ICD-10-CM

## 2017-09-08 DIAGNOSIS — L409 Psoriasis, unspecified: Secondary | ICD-10-CM

## 2017-09-16 ENCOUNTER — Ambulatory Visit (HOSPITAL_COMMUNITY)
Admission: RE | Admit: 2017-09-16 | Discharge: 2017-09-16 | Disposition: A | Discharge status: Home / Self Care | Payer: Medicare Other | Source: Ambulatory Visit | Attending: Rheumatology | Admitting: Rheumatology

## 2017-09-16 DIAGNOSIS — L4050 Arthropathic psoriasis, unspecified: Secondary | ICD-10-CM | POA: Diagnosis not present

## 2017-09-16 DIAGNOSIS — L409 Psoriasis, unspecified: Secondary | ICD-10-CM

## 2017-09-16 MED ORDER — DIPHENHYDRAMINE HCL 25 MG PO CAPS
25.0000 mg | ORAL_CAPSULE | ORAL | Status: DC
  Administered 2017-09-16: 25 mg via ORAL

## 2017-09-16 MED ORDER — SODIUM CHLORIDE 0.9 % IV SOLN
2.0000 mg/kg | INTRAVENOUS | Status: DC
  Administered 2017-09-16: 108.75 mg via INTRAVENOUS
  Filled 2017-09-16: qty 8.7

## 2017-09-16 MED ORDER — ACETAMINOPHEN 325 MG PO TABS: 650.0000 mg | ORAL_TABLET | ORAL | Status: DC

## 2017-09-16 MED ORDER — DIPHENHYDRAMINE HCL 25 MG PO CAPS
ORAL_CAPSULE | ORAL | Status: AC
  Filled 2017-09-16: qty 1

## 2017-09-20 ENCOUNTER — Other Ambulatory Visit: Payer: Self-pay | Admitting: Rheumatology

## 2017-09-22 NOTE — Telephone Encounter (Signed)
Last Visit: 07/24/17 Next Visit: 12/30/17 Labs: 07/22/17 Stable   Okay to refill per Dr. Corliss Skainseveshwar

## 2017-09-26 ENCOUNTER — Encounter: Payer: Self-pay | Admitting: Rheumatology

## 2017-09-26 MED ORDER — FOLIC ACID 1 MG PO TABS: 2.0000 mg | ORAL_TABLET | Freq: Every day | ORAL | 3 refills | Status: DC

## 2017-09-26 NOTE — Telephone Encounter (Signed)
Last Visit: 07/24/17 Next Visit: 12/30/17  Okay to refill per Dr. Deveshwar 

## 2017-09-30 MED ORDER — FOLIC ACID 1 MG PO TABS: 2.0000 mg | ORAL_TABLET | Freq: Every day | ORAL | 3 refills | Status: DC

## 2017-11-09 ENCOUNTER — Other Ambulatory Visit: Payer: Self-pay | Admitting: Rheumatology

## 2017-11-10 ENCOUNTER — Other Ambulatory Visit: Payer: Self-pay | Admitting: *Deleted

## 2017-11-10 ENCOUNTER — Other Ambulatory Visit (HOSPITAL_COMMUNITY): Payer: Self-pay | Admitting: *Deleted

## 2017-11-10 DIAGNOSIS — L4050 Arthropathic psoriasis, unspecified: Secondary | ICD-10-CM

## 2017-11-10 MED ORDER — METHOTREXATE SODIUM CHEMO INJECTION (PF) 50 MG/2ML: INTRAMUSCULAR | 0 refills | Status: DC

## 2017-11-10 MED ORDER — "TUBERCULIN SYRINGE 27G X 1/2"" 1 ML MISC": 6 refills | Status: DC

## 2017-11-10 NOTE — Telephone Encounter (Signed)
Last Visit: 07/24/17 Next Visit: 12/30/17 Labs: 07/22/17 Stable   Okay to refill per Dr. Corliss Skainseveshwar

## 2017-11-11 ENCOUNTER — Ambulatory Visit (HOSPITAL_COMMUNITY)
Admission: RE | Admit: 2017-11-11 | Discharge: 2017-11-11 | Disposition: A | Discharge status: Home / Self Care | Payer: Medicare Other | Source: Ambulatory Visit | Attending: Rheumatology | Admitting: Rheumatology

## 2017-11-11 DIAGNOSIS — L4050 Arthropathic psoriasis, unspecified: Secondary | ICD-10-CM | POA: Diagnosis not present

## 2017-11-11 LAB — COMPREHENSIVE METABOLIC PANEL
ALBUMIN: 3.5 g/dL (ref 3.5–5.0)
ALK PHOS: 60 U/L (ref 38–126)
ALT: 18 U/L (ref 0–44)
AST: 27 U/L (ref 15–41)
Anion gap: 8 (ref 5–15)
BUN: 28 mg/dL — ABNORMAL HIGH (ref 8–23)
CALCIUM: 9.1 mg/dL (ref 8.9–10.3)
CO2: 24 mmol/L (ref 22–32)
CREATININE: 0.68 mg/dL (ref 0.44–1.00)
Chloride: 104 mmol/L (ref 98–111)
GFR calc Af Amer: >60 mL/min (ref >60)
GFR calc non Af Amer: >60 mL/min (ref >60)
GLUCOSE: 99 mg/dL (ref 70–99)
Potassium: 4.4 mmol/L (ref 3.5–5.1)
SODIUM: 136 mmol/L (ref 135–145)
Total Bilirubin: 1.1 mg/dL (ref 0.3–1.2)
Total Protein: 6.4 g/dL — ABNORMAL LOW (ref 6.5–8.1)

## 2017-11-11 LAB — CBC
HCT: 40 % (ref 36.0–46.0)
HEMOGLOBIN: 13 g/dL (ref 12.0–15.0)
MCH: 31.3 pg (ref 26.0–34.0)
MCHC: 32.5 g/dL (ref 30.0–36.0)
MCV: 96.2 fL (ref 78.0–100.0)
Platelets: 281 10*3/uL (ref 150–400)
RBC: 4.16 MIL/uL (ref 3.87–5.11)
RDW: 14.6 % (ref 11.5–15.5)
WBC: 5.9 10*3/uL (ref 4.0–10.5)

## 2017-11-11 MED ORDER — ACETAMINOPHEN 325 MG PO TABS: 650.0000 mg | ORAL_TABLET | ORAL | Status: DC

## 2017-11-11 MED ORDER — DIPHENHYDRAMINE HCL 25 MG PO CAPS: 25.0000 mg | ORAL_CAPSULE | ORAL | Status: DC

## 2017-11-11 MED ORDER — SODIUM CHLORIDE 0.9 % IV SOLN
2.0000 mg/kg | INTRAVENOUS | Status: DC
  Administered 2017-11-11: 108.75 mg via INTRAVENOUS
  Filled 2017-11-11: qty 8.7

## 2017-11-11 MED ORDER — DIPHENHYDRAMINE HCL 25 MG PO CAPS
ORAL_CAPSULE | ORAL | Status: AC
  Administered 2017-11-11: 25 mg
  Filled 2017-11-11: qty 1

## 2017-11-11 NOTE — Progress Notes (Signed)
Labs are stable.

## 2017-12-16 ENCOUNTER — Other Ambulatory Visit: Payer: Self-pay | Admitting: *Deleted

## 2017-12-16 NOTE — Progress Notes (Signed)
Infusion orders are current for patient CBC CMP Tylenol Benadryl appointments are up to date and follow up appointment  is scheduled TB gold not due yet.  

## 2017-12-16 NOTE — Progress Notes (Signed)
Office Visit Note  Patient: Amy Hayes             Date of Birth: 1946/03/26           MRN: [id number]             PCP: Milda Smart, MD Referring: Milda Smart, MD Visit Date: 12/30/2017 Occupation: @GUAROCC @  Subjective:  Back pain   History of Present Illness: Amy Hayes is a 72 y.o. female with history of psoriatic arthritis, osteoarthritis, and DDD.  She is on MTX 0.8 ml sq once a week, folic acid 2 mg daily, and [name] Aria IV infusions.  She started on [name] in April 2019.  She takes Celebrex 200 mg 1 capsule po PRN for pain relief.   She states she is currently on Keflex 500 mg TID daily for 10 days for treatment of infected ingrown toenails. She has been soaking her feet in warm epsom salt baths and covering with a band-aid. She states she has also had a productive cough and congestion for the past 3 weeks.  She denies any fevers. She has been off MTX for the past 2 weeks.  She reports the pain is most severe in her mid-back. She states the pain is most severe if lifting or standing up straight. She describes the pain as a heavy sensation.  She states she has intermittent pain that radiates down the left leg but denies any numbness or tingling.  She reports she used to use TENS unit which helped.  She also uses a heating pad and wears a brace at times.  She denies any other joint pain at this time.  She reports she does have swelling of PIP joints. She denies any achilles tendonitis or plantar fasciitis.   Activities of Daily Living:  Patient reports morning stiffness  all day.   Patient Reports nocturnal pain.  Difficulty dressing/grooming: Denies Difficulty climbing stairs: Reports Difficulty getting out of chair: Reports Difficulty using hands for taps, buttons, cutlery, and/or writing: Reports  Review of Systems  Constitutional: Negative for fatigue.  HENT: Positive for mouth dryness. Negative for mouth sores and nose dryness.   Eyes: Negative for pain,  visual disturbance and dryness.  Respiratory: Positive for cough. Negative for hemoptysis, shortness of breath and difficulty breathing.   Cardiovascular: Negative for chest pain, palpitations, hypertension and swelling in legs/feet.  Gastrointestinal: Negative for blood in stool, constipation and diarrhea.  Endocrine: Negative for increased urination.  Genitourinary: Negative for painful urination.  Musculoskeletal: Positive for arthralgias, joint pain, joint swelling, myalgias, morning stiffness, muscle tenderness and myalgias. Negative for muscle weakness.  Skin: Negative for color change, pallor, rash, hair loss, nodules/bumps, skin tightness, ulcers and sensitivity to sunlight.  Allergic/Immunologic: Negative for susceptible to infections.  Neurological: Negative for dizziness, numbness, headaches and weakness.  Hematological: Negative for swollen glands.  Psychiatric/Behavioral: Negative for depressed mood and sleep disturbance. The patient is not nervous/anxious.     PMFS History:  Patient Active Problem List   Diagnosis Date Noted  . DDD (degenerative disc disease), lumbar 07/24/2017  . Primary osteoarthritis of both hands 03/04/2017  . DDD (degenerative disc disease), cervical s/p Fusion  10/30/2016  . Raynaud's disease without gangrene 10/30/2016  . History of migraine 10/30/2016  . History of gastroesophageal reflux (GERD) 10/30/2016  . High risk medication use 10/30/2016  . Psoriasis 05/02/2016  . Bilateral low back pain with sciatica 05/02/2016  . Osteopenia 05/02/2016    Past Medical History:  Diagnosis Date  . GERD (gastroesophageal reflux disease)   . Osteoporosis     History reviewed. No pertinent family history. Past Surgical History:  Procedure Laterality Date  . ABDOMINAL HYSTERECTOMY    . CARPAL TUNNEL RELEASE    . CERVICAL FUSION    . EYE SURGERY    . ingrown toenail removal    . KNEE ARTHROSCOPY Right   . KNEE CARTILAGE SURGERY    . ROTATOR CUFF  REPAIR    . SPINAL FUSION     cervical fusion   Social History   Social History Narrative  . Not on file    Objective: Vital Signs: BP (!) 158/74 (BP Location: Left Arm, Patient Position: Sitting, Cuff Size: Small)   Pulse 70   Resp 12   Ht 5' (1.524 m)   Wt 125 lb 3.2 oz (56.8 kg)   BMI 24.45 kg/m    Physical Exam  Constitutional: She is oriented to person, place, and time. She appears well-developed and well-nourished.  HENT:  Head: Normocephalic and atraumatic.  Eyes: Conjunctivae and EOM are normal.  Neck: Normal range of motion.  Cardiovascular: Normal rate, regular rhythm, normal heart sounds and intact distal pulses.  Pulmonary/Chest: Effort normal and breath sounds normal.  Abdominal: Soft. Bowel sounds are normal.  Lymphadenopathy:    She has no cervical adenopathy.  Neurological: She is alert and oriented to person, place, and time.  Skin: Skin is warm and dry. Capillary refill takes less than 2 seconds.  Psychiatric: She has a normal mood and affect. Her behavior is normal.  Nursing note and vitals reviewed.    Musculoskeletal Exam: C-spine, thoracic, and lumbar spine limited ROM.  Discomfort with extension of lumbar spine. Shoulder joints, elbow joints, wrist joints, MCPs, PIPs, and DIPs good ROM.  Synovial thickening of all PIP and DIP joints.  Synovitis of left 2nd, 3rd, and 4th PIP joints. Hip joints, knee joints, MTPs, PIPs, and DIPs good ROM.  No warmth or effusion of knee joints.  No tenderness or swelling of ankle joints. No tenderness of trochanteric bursa bilaterally.   CDAI Exam: CDAI Score: 6.8  Patient Global Assessment: 4 (mm); Provider Global Assessment: 4 (mm) Swollen: 3 ; Tender: 3  Joint Exam      Right  Left  PIP 2     Swollen Tender  PIP 3     Swollen Tender  PIP 4     Swollen Tender     Investigation: No additional findings.  Imaging: No results found.  Recent Labs: Lab Results  Component Value Date   WBC 5.9 11/11/2017    HGB 13.0 11/11/2017   PLT 281 11/11/2017   NA 136 11/11/2017   K 4.4 11/11/2017   CL 104 11/11/2017   CO2 24 11/11/2017   GLUCOSE 99 11/11/2017   BUN 28 (H) 11/11/2017   CREATININE 0.68 11/11/2017   BILITOT 1.1 11/11/2017   ALKPHOS 60 11/11/2017   AST 27 11/11/2017   ALT 18 11/11/2017   PROT 6.4 (L) 11/11/2017   ALBUMIN 3.5 11/11/2017   CALCIUM 9.1 11/11/2017   GFRAA >60 11/11/2017   QFTBGOLDPLUS Negative 07/22/2017    Speciality Comments: [name] Aria infusions every 8 weeks, started on 11/2016 TB gold negative 08/01/16  Procedures:  No procedures performed Allergies: Codeine and Iodine   Assessment / Plan:     Visit Diagnoses: Psoriatic arthropathy (HCC): She continues to have synovitis of left 2nd, 3rd, and 4th PIP joints.  She has no achilles tendonitis  or plantar fasciitis. She has no SI joint tenderness. She has been off of MTX for 2 weeks due to being started on Keflex for treatment of infected ingrown toenails.  She has also had a productive cough for the past 3 weeks.  She is due for her next [name] Aria infusion on 01/06/18.  She was started on infusions in April 2019, but she has not noticed significant benefit yet. She was advised to postpone her infusion if she is still on antibiotics and having a productive cough.  She will be following up with her podiatrist in 3 weeks for evaluation of ingrown toenails.  She will follow up in our office in 5 months.  She was advised to notify us if she develops increased joint pain or joint swelling.     Psoriasis: She has no active psoriasis at this time.   High risk medication use - MTX 0.8 ml sq q wk, folic acid 2mg  po qd, [name] Aria IV. (Humira -discontinued due to insurance coverage).  CBC and CMP were drawn on 11/11/17. TB gold negative 07/22/17.  Primary osteoarthritis of both hands: She has PIP and DIP synovial thickening consistent with osteoarthritis.  Joint protection and muscle strengthening were discussed.   Raynaud's  disease without gangrene: She continues to have mild symptoms of Raynaud's.  She has no signs of digital ulcerations.    Osteopenia of multiple sites  DDD (degenerative disc disease), cervical: Limited ROM with discomfort.   DDD (degenerative disc disease), lumbar: Chronic pain.  She has been experiencing intermittent radiating pain down the left lower extremity.  She declined a MRI at this time. She was advised to avoid lifting heavy objects.   History of migraine  History of gastroesophageal reflux (GERD)   Orders: No orders of the defined types were placed in this encounter.  No orders of the defined types were placed in this encounter.   Face-to-face time spent with patient was 30 minutes. Greater than 50% of time was spent in counseling and coordination of care.  Follow-Up Instructions: Return in about 5 months (around 06/01/2018) for Psoriatic arthritis, Osteoarthritis, DDD.   Gearldine Bienenstockaylor M Meilani Edmundson, PA-C  Note - This record has been created using Dragon software.  Chart creation errors have been sought, but may not always  have been located. Such creation errors do not reflect on  the standard of medical care.

## 2017-12-18 ENCOUNTER — Other Ambulatory Visit: Payer: Self-pay | Admitting: Rheumatology

## 2017-12-18 NOTE — Telephone Encounter (Signed)
Last Visit: 07/24/17 Next Visit: 12/30/17 Labs: 11/11/17 stable  Okay to refill per Dr. Corliss Skainseveshwar

## 2017-12-30 ENCOUNTER — Encounter: Payer: Self-pay | Admitting: Physician Assistant

## 2017-12-30 ENCOUNTER — Ambulatory Visit (INDEPENDENT_AMBULATORY_CARE_PROVIDER_SITE_OTHER): Payer: Medicare Other | Admitting: Physician Assistant

## 2017-12-30 VITALS — BP 158/74 | HR 70 | Resp 12 | Ht 60.0 in | Wt 125.2 lb

## 2017-12-30 DIAGNOSIS — Z79899 Other long term (current) drug therapy: Secondary | ICD-10-CM | POA: Diagnosis not present

## 2017-12-30 DIAGNOSIS — M5030 Other cervical disc degeneration, unspecified cervical region: Secondary | ICD-10-CM

## 2017-12-30 DIAGNOSIS — M19041 Primary osteoarthritis, right hand: Secondary | ICD-10-CM

## 2017-12-30 DIAGNOSIS — Z8669 Personal history of other diseases of the nervous system and sense organs: Secondary | ICD-10-CM

## 2017-12-30 DIAGNOSIS — L409 Psoriasis, unspecified: Secondary | ICD-10-CM

## 2017-12-30 DIAGNOSIS — I7300 Raynaud's syndrome without gangrene: Secondary | ICD-10-CM

## 2017-12-30 DIAGNOSIS — M19042 Primary osteoarthritis, left hand: Secondary | ICD-10-CM

## 2017-12-30 DIAGNOSIS — L4050 Arthropathic psoriasis, unspecified: Secondary | ICD-10-CM

## 2017-12-30 DIAGNOSIS — M8589 Other specified disorders of bone density and structure, multiple sites: Secondary | ICD-10-CM

## 2017-12-30 DIAGNOSIS — M5136 Other intervertebral disc degeneration, lumbar region: Secondary | ICD-10-CM

## 2017-12-30 DIAGNOSIS — Z8719 Personal history of other diseases of the digestive system: Secondary | ICD-10-CM

## 2018-01-06 ENCOUNTER — Inpatient Hospital Stay (HOSPITAL_COMMUNITY): Admission: RE | Admit: 2018-01-06 | Payer: PRIVATE HEALTH INSURANCE | Source: Ambulatory Visit

## 2018-01-22 ENCOUNTER — Encounter: Payer: Self-pay | Admitting: Rheumatology

## 2018-01-28 ENCOUNTER — Other Ambulatory Visit: Payer: Self-pay | Admitting: *Deleted

## 2018-01-28 NOTE — Progress Notes (Signed)
Infusion orders are current for patient CBC CMP Tylenol Benadryl appointments are up to date and follow up appointment  is scheduled TB gold not due yet.  

## 2018-02-05 ENCOUNTER — Ambulatory Visit (HOSPITAL_COMMUNITY)
Admission: RE | Admit: 2018-02-05 | Discharge: 2018-02-05 | Disposition: A | Discharge status: Home / Self Care | Payer: Medicare Other | Source: Ambulatory Visit | Attending: Rheumatology | Admitting: Rheumatology

## 2018-02-05 DIAGNOSIS — L4050 Arthropathic psoriasis, unspecified: Secondary | ICD-10-CM | POA: Diagnosis not present

## 2018-02-05 LAB — COMPREHENSIVE METABOLIC PANEL
ALK PHOS: 70 U/L (ref 38–126)
ALT: 18 U/L (ref 0–44)
AST: 26 U/L (ref 15–41)
Albumin: 3.1 g/dL — ABNORMAL LOW (ref 3.5–5.0)
Anion gap: 11 (ref 5–15)
BILIRUBIN TOTAL: 0.7 mg/dL (ref 0.3–1.2)
BUN: 19 mg/dL (ref 8–23)
CALCIUM: 8.9 mg/dL (ref 8.9–10.3)
CHLORIDE: 106 mmol/L (ref 98–111)
CO2: 22 mmol/L (ref 22–32)
CREATININE: 0.56 mg/dL (ref 0.44–1.00)
Glucose, Bld: 105 mg/dL — ABNORMAL HIGH (ref 70–99)
Potassium: 3.9 mmol/L (ref 3.5–5.1)
Sodium: 139 mmol/L (ref 135–145)
TOTAL PROTEIN: 5.9 g/dL — AB (ref 6.5–8.1)

## 2018-02-05 LAB — CBC
HEMATOCRIT: 40.1 % (ref 36.0–46.0)
HEMOGLOBIN: 12.7 g/dL (ref 12.0–15.0)
MCH: 30.4 pg (ref 26.0–34.0)
MCHC: 31.7 g/dL (ref 30.0–36.0)
MCV: 95.9 fL (ref 80.0–100.0)
NRBC: 0 % (ref 0.0–0.2)
Platelets: 295 10*3/uL (ref 150–400)
RBC: 4.18 MIL/uL (ref 3.87–5.11)
RDW: 13.2 % (ref 11.5–15.5)
WBC: 7 10*3/uL (ref 4.0–10.5)

## 2018-02-05 MED ORDER — SODIUM CHLORIDE 0.9 % IV SOLN
2.0000 mg/kg | INTRAVENOUS | Status: DC
  Administered 2018-02-05: 112.5 mg via INTRAVENOUS
  Filled 2018-02-05: qty 9

## 2018-02-05 MED ORDER — ACETAMINOPHEN 325 MG PO TABS: 650.0000 mg | ORAL_TABLET | ORAL | Status: DC

## 2018-02-05 MED ORDER — DIPHENHYDRAMINE HCL 25 MG PO CAPS: 25.0000 mg | ORAL_CAPSULE | ORAL | Status: DC

## 2018-02-26 ENCOUNTER — Encounter: Payer: Self-pay | Admitting: Rheumatology

## 2018-03-03 ENCOUNTER — Encounter (HOSPITAL_COMMUNITY): Payer: PRIVATE HEALTH INSURANCE

## 2018-03-18 ENCOUNTER — Other Ambulatory Visit: Payer: Self-pay | Admitting: *Deleted

## 2018-03-18 MED ORDER — CELECOXIB 200 MG PO CAPS: ORAL_CAPSULE | ORAL | 0 refills | Status: DC

## 2018-03-18 NOTE — Telephone Encounter (Signed)
Refill request received via fax  Last visit: 12/30/17 Next Visit: 06/03/18 Labs: 02/05/18 Glucose is 105. Total protein stable. CBC WNL.  Okay to refill per Dr. Corliss Skainseveshwar

## 2018-03-23 ENCOUNTER — Telehealth: Payer: Self-pay | Admitting: Rheumatology

## 2018-03-23 NOTE — Telephone Encounter (Signed)
Patient called stating her left leg and knee are very painful and is not sure if she should make an appointment to see Dr. Corliss Skainseveshwar or if she should see an orthopedic doctor.  Patient requested a return call.

## 2018-03-23 NOTE — Telephone Encounter (Signed)
Patient states she is having pain in her left leg. Patient states she is having swelling in her right ankle. Patient states the pain in her left leg has been going on for about a month. Patient states it hurt when she sits too long, as well when she tries to walk. Patiens does not recall any injury. Patient has been scheduled for 03/26/18 at 2:00 pm.

## 2018-03-24 NOTE — Progress Notes (Signed)
Office Visit Note  Patient: Amy Hayes             Date of Birth: 1946/02/10           MRN: [id number]             PCP: Milda Smartalton, Timothy J, MD Referring: Milda Smartalton, Timothy J, MD Visit Date: 03/26/2018 Occupation: @GUAROCC @  Subjective:  Lower back pain    History of Present Illness: Amy Hayes is a 72 y.o. female  with history of psoriatic arthritis, osteoarthritis, and DDD.  She is receiving [name] Aria IV infusions every 8 weeks and is on methotrexate 0.8 mL subcutaneously once weekly and folic acid 2 mg by mouth daily.  She continues to have pain and stiffness in bilateral hands.  She denies any Achilles tenderness or plantar fasciitis.  She denies any active psoriasis.  She continues to have intermittent symptoms of Raynaud's which cause numbness and tingling in bilateral hands.  Her most recent infusion was on 02/05/2018.  Patient presents today with lower back pain.  She states the pain is been radiating down her left lower extremity.  She states that the discomfort is caused change in gait which has been leading to increased left knee pain.  She states in the evening her left knee is swollen.  She states that she is been having left knee pain for the past 4 weeks.  She states the pain is a burning sensation.  She denies any hip or groin pain.    Activities of Daily Living:  Patient reports morning stiffness all day.   Patient Reports nocturnal pain.  Difficulty dressing/grooming: Denies Difficulty climbing stairs: Reports Difficulty getting out of chair: Reports Difficulty using hands for taps, buttons, cutlery, and/or writing: Reports  Review of Systems  Constitutional: Negative for fatigue.  HENT: Negative for mouth sores, trouble swallowing, trouble swallowing, mouth dryness and nose dryness.   Eyes: Negative for pain, redness, itching, visual disturbance and dryness.  Respiratory: Negative for cough, hemoptysis, shortness of breath, wheezing and difficulty breathing.     Cardiovascular: Negative for chest pain, palpitations, hypertension and swelling in legs/feet.  Gastrointestinal: Negative for abdominal pain, blood in stool, constipation, diarrhea, nausea and vomiting.  Endocrine: Negative for increased urination.  Genitourinary: Negative for painful urination, nocturia and pelvic pain.  Musculoskeletal: Positive for arthralgias, joint pain, joint swelling and morning stiffness. Negative for myalgias, muscle weakness, muscle tenderness and myalgias.  Skin: Negative for color change, pallor, rash, hair loss, nodules/bumps, skin tightness, ulcers and sensitivity to sunlight.  Allergic/Immunologic: Negative for susceptible to infections.  Neurological: Positive for headaches. Negative for dizziness, light-headedness, numbness, memory loss and weakness.  Hematological: Negative for swollen glands.  Psychiatric/Behavioral: Negative for depressed mood, confusion and sleep disturbance. The patient is not nervous/anxious.     PMFS History:  Patient Active Problem List   Diagnosis Date Noted  . DDD (degenerative disc disease), lumbar 07/24/2017  . Primary osteoarthritis of both hands 03/04/2017  . DDD (degenerative disc disease), cervical s/p Fusion  10/30/2016  . Raynaud's disease without gangrene 10/30/2016  . History of migraine 10/30/2016  . History of gastroesophageal reflux (GERD) 10/30/2016  . High risk medication use 10/30/2016  . Psoriasis 05/02/2016  . Bilateral low back pain with sciatica 05/02/2016  . Osteopenia 05/02/2016    Past Medical History:  Diagnosis Date  . GERD (gastroesophageal reflux disease)   . Osteoporosis     History reviewed. No pertinent family history. Past Surgical History:  Procedure Laterality  Date  . ABDOMINAL HYSTERECTOMY    . CARPAL TUNNEL RELEASE    . CERVICAL FUSION    . EYE SURGERY    . ingrown toenail removal    . KNEE ARTHROSCOPY Right   . KNEE CARTILAGE SURGERY    . ROTATOR CUFF REPAIR    . SPINAL FUSION      cervical fusion   Social History   Social History Narrative  . Not on file   Immunization History  Administered Date(s) Administered  . Influenza-Unspecified 02/20/2018  . Pneumococcal Conjugate-13 08/11/2014  . Pneumococcal Polysaccharide-23 05/29/2009, 08/29/2015  . Tdap 12/17/2007    Objective: Vital Signs: BP 140/69 (BP Location: Left Arm, Patient Position: Sitting, Cuff Size: Normal)   Pulse (!) 59   Resp 12   Ht 5' (1.524 m)   Wt 128 lb 9.6 oz (58.3 kg)   BMI 25.12 kg/m    Physical Exam  Constitutional: She is oriented to person, place, and time. She appears well-developed and well-nourished.  HENT:  Head: Normocephalic and atraumatic.  Eyes: Conjunctivae and EOM are normal.  Neck: Normal range of motion.  Cardiovascular: Normal rate, regular rhythm, normal heart sounds and intact distal pulses.  Pulmonary/Chest: Effort normal and breath sounds normal.  Abdominal: Soft. Bowel sounds are normal.  Lymphadenopathy:    She has no cervical adenopathy.  Neurological: She is alert and oriented to person, place, and time.  Skin: Skin is warm and dry. Capillary refill takes less than 2 seconds.  Psychiatric: She has a normal mood and affect. Her behavior is normal.  Nursing note and vitals reviewed.    Musculoskeletal Exam: C-spine slightly limited ROM with lateral rotation.  Thoracic and lumbar spine good ROM.  Scoliosis of lumbar spine. Shoulder joints, elbow joints, wrist joints, MCPs and PIPs and DIPs range of motion with no synovitis.  Complete fist formation bilaterally. PIP and DIP synovial thickening consistent with osteoarthritis of bilateral hands.  Bilateral CMC joint synovial thickening.  Hip joints good ROM with no discomfort.  Full ROM of both knee joints. No warmth or effusion of knee joints.  No tenderness or swelling of ankle joints.  No tenderness of trochanteric bursa bilaterally. No achilles tendonitis or plantar fasciitis.    CDAI Exam: CDAI Score:  1.3  Patient Global Assessment: 9 (mm); Provider Global Assessment: 4 (mm) Swollen: 0 ; Tender: 0  Joint Exam   Not documented   There is currently no information documented on the homunculus. Go to the Rheumatology activity and complete the homunculus joint exam.  Investigation: No additional findings.  Imaging: No results found.  Recent Labs: Lab Results  Component Value Date   WBC 7.0 02/05/2018   HGB 12.7 02/05/2018   PLT 295 02/05/2018   NA 139 02/05/2018   K 3.9 02/05/2018   CL 106 02/05/2018   CO2 22 02/05/2018   GLUCOSE 105 (H) 02/05/2018   BUN 19 02/05/2018   CREATININE 0.56 02/05/2018   BILITOT 0.7 02/05/2018   ALKPHOS 70 02/05/2018   AST 26 02/05/2018   ALT 18 02/05/2018   PROT 5.9 (L) 02/05/2018   ALBUMIN 3.1 (L) 02/05/2018   CALCIUM 8.9 02/05/2018   GFRAA >60 02/05/2018   QFTBGOLDPLUS Negative 07/22/2017    Speciality Comments: [name] Aria infusions every 8 weeks, started on 11/2016 TB gold negative 08/01/16  Procedures:  Large Joint Inj: L greater trochanter on 03/26/2018 3:37 PM Indications: pain Details: 27 G 1.5 in needle, lateral approach  Arthrogram: No  Medications: 1 mL  lidocaine 1 %; 40 mg triamcinolone acetonide 40 MG/ML Aspirate: 0 mL Outcome: tolerated well, no immediate complications Procedure, treatment alternatives, risks and benefits explained, specific risks discussed. Consent was given by the patient. Immediately prior to procedure a time out was called to verify the correct patient, procedure, equipment, support staff and site/side marked as required. Patient was prepped and draped in the usual sterile fashion.     Allergies: Codeine and Iodine      Assessment / Plan:     Visit Diagnoses: Psoriatic arthropathy (HCC): She has no synovitis or dactylitis.  She has an active psoriasis at this time.  She has no Achilles tendinitis or plantar fasciitis.  She has no SI joint tenderness on exam.  She is on [name] Aria IV infusions  every 8 weeks and MTX 0.8 ml sq once weekly, and folic acid 2 mg po daily.  Her most recent infusion was 02/05/18.  Psoriasis: She has no active psoriasis.  She has nail dystrophy.    High risk medication use - MTX 0.8 ml sq q wk, folic acid 2mg  po qd, [name] Aria IV. (Humira -discontinued due to insurance coverage).  Last [name] infusion on 02/05/18.  Last TB gold negative on 07/22/2017.  Most recent CBC/CMP stable on 02/05/2018.  Next CBC/CMP due with next infusion scheduled for 04/02/18.  Patient is up-to-date with vaccines except for Shingrix.  Primary osteoarthritis of both hands: She has PIP and DIP synovial thickening consistent with osteoarthritis of bilateral hands.  She is complete fist ratio bilaterally.  No synovitis noted.  Joint protection and muscle strengthening were discussed.   Raynaud's disease without gangrene: She continues to have symptoms of Raynaud's in both hands and feet.  She experiences significant numbness and tingling in both hands.  She was encouraged to keep her core body temperature warm and wear gloves and socks.   Osteopenia of multiple sites: She takes a calcium and vitamin D supplement daily.   DDD (degenerative disc disease), cervical: She has slightly limited ROM with lateral rotation. She has no symptoms of radiculopathy   DDD (degenerative disc disease), lumbar: No midline spinal tenderness.  Good ROM with no discomfort.  XR of lumbar spine on 10/31/16 revealed levoscoliosis.  Severe narrowing between L2-L3, L3-L4 listhesis. Facet arthropathy. She has been having increased lower back pain and radiating pain down the left leg.   Trochanteric bursitis, left hip: She has tenderness of the left trochanteric bursa.  She requested a cortisone injection today.  She tolerated the procedure well.  The procedure note is completed above.  She was given a handout of exercises to perform.   Chronic pain of left knee - She has been having left knee pain for the past 4  weeks.  No injury.  No warmth or effusion on exam.  No mechanical symptoms.  A x-ray of the left knee was obtained today. Plan: XR KNEE 3 VIEW LEFT.  The x-ray was consistent moderate osteoarthritis and mild chondromalacia patella.  Handout on knee joint exercises were given.  Other medical conditions are listed as follows:  History of gastroesophageal reflux (GERD)  History of migraine   Orders: Orders Placed This Encounter  Procedures  . Large Joint Inj  . XR KNEE 3 VIEW LEFT   No orders of the defined types were placed in this encounter.   Face-to-face time spent with patient was 30 minutes. Greater than 50% of time was spent in counseling and coordination of care.  Follow-Up Instructions: Return in about  5 months (around 08/25/2018) for Psoriatic arthritis, Osteoarthritis, Raynaud's syndrome.   Gearldine Bienenstock, PA-C   I examined and evaluated the patient with Sherron Ales PA.  On my examination today she had tenderness on palpation over left trochanteric bursa consistent with trochanteric bursitis.  Left trochanteric bursa was injected with cortisone which she tolerated well.  She has been also having some discomfort in her left knee joint which is consistent with moderate osteoarthritis.  Left knee joint muscle strengthening exercises were given.  She had no warmth or swelling on the knee joint.  The plan of care was discussed as noted above.  Pollyann Savoy, MD  Note - This record has been created using Animal nutritionist.  Chart creation errors have been sought, but may not always  have been located. Such creation errors do not reflect on  the standard of medical care.

## 2018-03-26 ENCOUNTER — Ambulatory Visit (INDEPENDENT_AMBULATORY_CARE_PROVIDER_SITE_OTHER): Payer: Medicare Other

## 2018-03-26 ENCOUNTER — Encounter: Payer: Self-pay | Admitting: Rheumatology

## 2018-03-26 ENCOUNTER — Ambulatory Visit (INDEPENDENT_AMBULATORY_CARE_PROVIDER_SITE_OTHER): Payer: Medicare Other | Admitting: Rheumatology

## 2018-03-26 VITALS — BP 140/69 | HR 59 | Resp 12 | Ht 60.0 in | Wt 128.6 lb

## 2018-03-26 DIAGNOSIS — Z8669 Personal history of other diseases of the nervous system and sense organs: Secondary | ICD-10-CM

## 2018-03-26 DIAGNOSIS — M7062 Trochanteric bursitis, left hip: Secondary | ICD-10-CM

## 2018-03-26 DIAGNOSIS — Z79899 Other long term (current) drug therapy: Secondary | ICD-10-CM | POA: Diagnosis not present

## 2018-03-26 DIAGNOSIS — M19042 Primary osteoarthritis, left hand: Secondary | ICD-10-CM

## 2018-03-26 DIAGNOSIS — L409 Psoriasis, unspecified: Secondary | ICD-10-CM | POA: Diagnosis not present

## 2018-03-26 DIAGNOSIS — M8589 Other specified disorders of bone density and structure, multiple sites: Secondary | ICD-10-CM

## 2018-03-26 DIAGNOSIS — L4050 Arthropathic psoriasis, unspecified: Secondary | ICD-10-CM

## 2018-03-26 DIAGNOSIS — M5136 Other intervertebral disc degeneration, lumbar region: Secondary | ICD-10-CM

## 2018-03-26 DIAGNOSIS — Z8719 Personal history of other diseases of the digestive system: Secondary | ICD-10-CM

## 2018-03-26 DIAGNOSIS — G8929 Other chronic pain: Secondary | ICD-10-CM | POA: Diagnosis not present

## 2018-03-26 DIAGNOSIS — I7300 Raynaud's syndrome without gangrene: Secondary | ICD-10-CM

## 2018-03-26 DIAGNOSIS — M5030 Other cervical disc degeneration, unspecified cervical region: Secondary | ICD-10-CM

## 2018-03-26 DIAGNOSIS — M25562 Pain in left knee: Secondary | ICD-10-CM

## 2018-03-26 DIAGNOSIS — M19041 Primary osteoarthritis, right hand: Secondary | ICD-10-CM

## 2018-03-26 MED ORDER — TRIAMCINOLONE ACETONIDE 40 MG/ML IJ SUSP
40.0000 mg | INTRAMUSCULAR | Status: AC | PRN
  Administered 2018-03-26: 40 mg via INTRA_ARTICULAR

## 2018-03-26 MED ORDER — LIDOCAINE HCL 1 % IJ SOLN
1.0000 mL | INTRAMUSCULAR | Status: AC | PRN
  Administered 2018-03-26: 1 mL

## 2018-03-26 NOTE — Patient Instructions (Addendum)
Trochanteric Bursitis Rehab Ask your health care provider which exercises are safe for you. Do exercises exactly as told by your health care provider and adjust them as directed. It is normal to feel mild stretching, pulling, tightness, or discomfort as you do these exercises, but you should stop right away if you feel sudden pain or your pain gets worse.Do not begin these exercises until told by your health care provider. Stretching exercises These exercises warm up your muscles and joints and improve the movement and flexibility of your hip. These exercises also help to relieve pain and stiffness. Exercise A: Iliotibial band stretch  1. Lie on your side with your left / right leg in the top position. 2. Bend your left / right knee and grab your ankle. 3. Slowly bring your knee back so your thigh is behind your body. 4. Slowly lower your knee toward the floor until you feel a gentle stretch on the outside of your left / right thigh. If you do not feel a stretch and your knee will not fall farther, place the heel of your other foot on top of your outer knee and pull your thigh down farther. 5. Hold this position for __________ seconds. 6. Slowly return to the starting position. Repeat __________ times. Complete this exercise __________ times a day. Strengthening exercises These exercises build strength and endurance in your hip and pelvis. Endurance is the ability to use your muscles for a long time, even after they get tired. Exercise B: Bridge ( hip extensors) 1. Lie on your back on a firm surface with your knees bent and your feet flat on the floor. 2. Tighten your buttocks muscles and lift your buttocks off the floor until your trunk is level with your thighs. You should feel the muscles working in your buttocks and the back of your thighs. If this exercise is too easy, try doing it with your arms crossed over your chest. 3. Hold this position for __________ seconds. 4. Slowly return to the  starting position. 5. Let your muscles relax completely between repetitions. Repeat __________ times. Complete this exercise __________ times a day. Exercise C: Squats ( knee extensors and  quadriceps) 1. Stand in front of a table, with your feet and knees pointing straight ahead. You may rest your hands on the table for balance but not for support. 2. Slowly bend your knees and lower your hips like you are going to sit in a chair. ? Keep your weight over your heels, not over your toes. ? Keep your lower legs upright so they are parallel with the table legs. ? Do not let your hips go lower than your knees. ? Do not bend lower than told by your health care provider. ? If your hip pain increases, do not bend as low. 3. Hold this position for __________ seconds. 4. Slowly push with your legs to return to standing. Do not use your hands to pull yourself to standing. Repeat __________ times. Complete this exercise __________ times a day. Exercise D: Hip hike 1. Stand sideways on a bottom step. Stand on your left / right leg with your other foot unsupported next to the step. You can hold onto the railing or wall if needed for balance. 2. Keeping your knees straight and your torso square, lift your left / right hip up toward the ceiling. 3. Hold this position for __________ seconds. 4. Slowly let your left / right hip lower toward the floor, past the starting position. Your foot   should get closer to the floor. Do not lean or bend your knees. Repeat __________ times. Complete this exercise __________ times a day. Exercise E: Single leg stand 1. Stand near a counter or door frame that you can hold onto for balance as needed. It is helpful to stand in front of a mirror for this exercise so you can watch your hip. 2. Squeeze your left / right buttock muscles then lift up your other foot. Do not let your left / right hip push out to the side. Hold this position for __________ seconds. Knee  Exercises Ask your health care provider which exercises are safe for you. Do exercises exactly as told by your health care provider and adjust them as directed. It is normal to feel mild stretching, pulling, tightness, or discomfort as you do these exercises, but you should stop right away if you feel sudden pain or your pain gets worse.Do not begin these exercises until told by your health care provider. STRETCHING AND RANGE OF MOTION EXERCISES These exercises warm up your muscles and joints and improve the movement and flexibility of your knee. These exercises also help to relieve pain, numbness, and tingling. Exercise A: Knee Extension, Prone 1. Lie on your abdomen on a bed. 2. Place your left / right knee just beyond the edge of the surface so your knee is not on the bed. You can put a towel under your left / right thigh just above your knee for comfort. 3. Relax your leg muscles and allow gravity to straighten your knee. You should feel a stretch behind your left / right knee. 4. Hold this position for __________ seconds. 5. Scoot up so your knee is supported between repetitions. Repeat __________ times. Complete this stretch __________ times a day. Exercise B: Knee Flexion, Active  1. Lie on your back with both knees straight. If this causes back discomfort, bend your left / right knee so your foot is flat on the floor. 2. Slowly slide your left / right heel back toward your buttocks until you feel a gentle stretch in the front of your knee or thigh. 3. Hold this position for __________ seconds. 4. Slowly slide your left / right heel back to the starting position. Repeat __________ times. Complete this exercise __________ times a day. Exercise C: Quadriceps, Prone  1. Lie on your abdomen on a firm surface, such as a bed or padded floor. 2. Bend your left / right knee and hold your ankle. If you cannot reach your ankle or pant leg, loop a belt around your foot and grab the belt  instead. 3. Gently pull your heel toward your buttocks. Your knee should not slide out to the side. You should feel a stretch in the front of your thigh and knee. 4. Hold this position for __________ seconds. Repeat __________ times. Complete this stretch __________ times a day. Exercise D: Hamstring, Supine 1. Lie on your back. 2. Loop a belt or towel over the ball of your left / right foot. The ball of your foot is on the walking surface, right under your toes. 3. Straighten your left / right knee and slowly pull on the belt to raise your leg until you feel a gentle stretch behind your knee. ? Do not let your left / right knee bend while you do this. ? Keep your other leg flat on the floor. 4. Hold this position for __________ seconds. Repeat __________ times. Complete this stretch __________ times a day. STRENGTHENING EXERCISES  These exercises build strength and endurance in your knee. Endurance is the ability to use your muscles for a long time, even after they get tired. Exercise E: Quadriceps, Isometric  1. Lie on your back with your left / right leg extended and your other knee bent. Put a rolled towel or small pillow under your knee if told by your health care provider. 2. Slowly tense the muscles in the front of your left / right thigh. You should see your kneecap slide up toward your hip or see increased dimpling just above the knee. This motion will push the back of the knee toward the floor. 3. For __________ seconds, keep the muscle as tight as you can without increasing your pain. 4. Relax the muscles slowly and completely. Repeat __________ times. Complete this exercise __________ times a day. Exercise F: Straight Leg Raises - Quadriceps 1. Lie on your back with your left / right leg extended and your other knee bent. 2. Tense the muscles in the front of your left / right thigh. You should see your kneecap slide up or see increased dimpling just above the knee. Your thigh may  even shake a bit. 3. Keep these muscles tight as you raise your leg 4-6 inches (10-15 cm) off the floor. Do not let your knee bend. 4. Hold this position for __________ seconds. 5. Keep these muscles tense as you lower your leg. 6. Relax your muscles slowly and completely after each repetition. Repeat __________ times. Complete this exercise __________ times a day. Exercise G: Hamstring, Isometric 1. Lie on your back on a firm surface. 2. Bend your left / right knee approximately __________ degrees. 3. Dig your left / right heel into the surface as if you are trying to pull it toward your buttocks. Tighten the muscles in the back of your thighs to dig as hard as you can without increasing any pain. 4. Hold this position for __________ seconds. 5. Release the tension gradually and allow your muscles to relax completely for __________ seconds after each repetition. Repeat __________ times. Complete this exercise __________ times a day. Exercise H: Hamstring Curls  If told by your health care provider, do this exercise while wearing ankle weights. Begin with __________ weights. Then increase the weight by 1 lb (0.5 kg) increments. Do not wear ankle weights that are more than __________. 1. Lie on your abdomen with your legs straight. 2. Bend your left / right knee as far as you can without feeling pain. Keep your hips flat against the floor. 3. Hold this position for __________ seconds. 4. Slowly lower your leg to the starting position.  Repeat __________ times. Complete this exercise __________ times a day. Exercise I: Squats (Quadriceps) 1. Stand in front of a table, with your feet and knees pointing straight ahead. You may rest your hands on the table for balance but not for support. 2. Slowly bend your knees and lower your hips like you are going to sit in a chair. ? Keep your weight over your heels, not over your toes. ? Keep your lower legs upright so they are parallel with the table  legs. ? Do not let your hips go lower than your knees. ? Do not bend lower than told by your health care provider. ? If your knee pain increases, do not bend as low. 3. Hold the squat position for __________ seconds. 4. Slowly push with your legs to return to standing. Do not use your hands to pull yourself to  standing. Repeat __________ times. Complete this exercise __________ times a day. Exercise J: Wall Slides (Quadriceps)  1. Lean your back against a smooth wall or door while you walk your feet out 18-24 inches (46-61 cm) from it. 2. Place your feet hip-width apart. 3. Slowly slide down the wall or door until your knees bend __________ degrees. Keep your knees over your heels, not over your toes. Keep your knees in line with your hips. 4. Hold for __________ seconds. Repeat __________ times. Complete this exercise __________ times a day. Exercise K: Straight Leg Raises - Hip Abductors 1. Lie on your side with your left / right leg in the top position. Lie so your head, shoulder, knee, and hip line up. You may bend your bottom knee to help you keep your balance. 2. Roll your hips slightly forward so your hips are stacked directly over each other and your left / right knee is facing forward. 3. Leading with your heel, lift your top leg 4-6 inches (10-15 cm). You should feel the muscles in your outer hip lifting. ? Do not let your foot drift forward. ? Do not let your knee roll toward the ceiling. 4. Hold this position for __________ seconds. 5. Slowly return your leg to the starting position. 6. Let your muscles relax completely after each repetition. Repeat __________ times. Complete this exercise __________ times a day. Exercise L: Straight Leg Raises - Hip Extensors 1. Lie on your abdomen on a firm surface. You can put a pillow under your hips if that is more comfortable. 2. Tense the muscles in your buttocks and lift your left / right leg about 4-6 inches (10-15 cm). Keep your knee  straight as you lift your leg. 3. Hold this position for __________ seconds. 4. Slowly lower your leg to the starting position. 5. Let your leg relax completely after each repetition. Repeat __________ times. Complete this exercise __________ times a day. This information is not intended to replace advice given to you by your health care provider. Make sure you discuss any questions you have with your health care provider. Document Released: 02/20/2005 Document Revised: 01/01/2016 Document Reviewed: 02/12/2015 Elsevier Interactive Patient Education  2018 [employer] 3.  Repeat __________ times. Complete this exercise __________ times a day. This information is not intended to replace advice given to you by your health care provider. Make sure you discuss any questions you have with your health care provider. Document Released: 05/16/2004 Document Revised: 12/14/2015 Document Reviewed: 03/24/2015 [employer] Interactive Patient Education  [employer]2018 [employer]

## 2018-04-01 ENCOUNTER — Other Ambulatory Visit: Payer: Self-pay | Admitting: *Deleted

## 2018-04-01 ENCOUNTER — Other Ambulatory Visit (HOSPITAL_COMMUNITY): Payer: Self-pay

## 2018-04-01 NOTE — Progress Notes (Signed)
Infusion orders are current for patient CBC CMP Tylenol Benadryl appointments are up to date and follow up appointment  is scheduled TB gold not due yet.  

## 2018-04-02 ENCOUNTER — Ambulatory Visit (HOSPITAL_COMMUNITY)
Admission: RE | Admit: 2018-04-02 | Discharge: 2018-04-02 | Disposition: A | Discharge status: Home / Self Care | Payer: Medicare Other | Source: Ambulatory Visit | Attending: Rheumatology | Admitting: Rheumatology

## 2018-04-02 DIAGNOSIS — L4050 Arthropathic psoriasis, unspecified: Secondary | ICD-10-CM | POA: Insufficient documentation

## 2018-04-02 LAB — CBC
HEMATOCRIT: 41.6 % (ref 36.0–46.0)
Hemoglobin: 13 g/dL (ref 12.0–15.0)
MCH: 30 pg (ref 26.0–34.0)
MCHC: 31.3 g/dL (ref 30.0–36.0)
MCV: 96.1 fL (ref 80.0–100.0)
NRBC: 0 % (ref 0.0–0.2)
Platelets: 371 10*3/uL (ref 150–400)
RBC: 4.33 MIL/uL (ref 3.87–5.11)
RDW: 14.4 % (ref 11.5–15.5)
WBC: 8.2 10*3/uL (ref 4.0–10.5)

## 2018-04-02 LAB — COMPREHENSIVE METABOLIC PANEL
ALBUMIN: 3.9 g/dL (ref 3.5–5.0)
ALT: 19 U/L (ref 0–44)
AST: 23 U/L (ref 15–41)
Alkaline Phosphatase: 67 U/L (ref 38–126)
Anion gap: 10 (ref 5–15)
BUN: 32 mg/dL — AB (ref 8–23)
CO2: 22 mmol/L (ref 22–32)
CREATININE: 0.66 mg/dL (ref 0.44–1.00)
Calcium: 9 mg/dL (ref 8.9–10.3)
Chloride: 105 mmol/L (ref 98–111)
GLUCOSE: 96 mg/dL (ref 70–99)
POTASSIUM: 4.3 mmol/L (ref 3.5–5.1)
Sodium: 137 mmol/L (ref 135–145)
Total Bilirubin: 0.6 mg/dL (ref 0.3–1.2)
Total Protein: 6.8 g/dL (ref 6.5–8.1)

## 2018-04-02 MED ORDER — DIPHENHYDRAMINE HCL 25 MG PO CAPS: 25.0000 mg | ORAL_CAPSULE | Freq: Once | ORAL | Status: DC

## 2018-04-02 MED ORDER — SODIUM CHLORIDE 0.9 % IV SOLN
2.0000 mg/kg | Freq: Once | INTRAVENOUS | Status: AC
  Administered 2018-04-02: 113.75 mg via INTRAVENOUS
  Filled 2018-04-02: qty 9.1

## 2018-04-02 NOTE — Progress Notes (Signed)
WNLs

## 2018-04-06 ENCOUNTER — Telehealth: Payer: Self-pay | Admitting: *Deleted

## 2018-04-06 NOTE — Telephone Encounter (Signed)
Patient states she is having cataract surgery on 04/17/18. Patient advised she to hold MTX 1 week before and 1 week after. Patient advised she should be okay for next [name] injection as it is not until May 28, 2018

## 2018-04-08 ENCOUNTER — Telehealth: Payer: Self-pay | Admitting: Pharmacy Technician

## 2018-04-08 NOTE — Telephone Encounter (Signed)
Left message for patient to see if she is expecting any insurance changes for 2020. Patient is receiving infusions that may require a pre-certification.  11:19 AM Amy Hayes, CPhT

## 2018-05-27 ENCOUNTER — Other Ambulatory Visit: Payer: Self-pay | Admitting: *Deleted

## 2018-05-27 DIAGNOSIS — L4050 Arthropathic psoriasis, unspecified: Secondary | ICD-10-CM

## 2018-05-28 ENCOUNTER — Ambulatory Visit (HOSPITAL_COMMUNITY)
Admission: RE | Admit: 2018-05-28 | Discharge: 2018-05-28 | Disposition: A | Discharge status: Home / Self Care | Payer: Medicare Other | Source: Ambulatory Visit | Attending: Rheumatology | Admitting: Rheumatology

## 2018-05-28 DIAGNOSIS — L4050 Arthropathic psoriasis, unspecified: Secondary | ICD-10-CM

## 2018-05-28 LAB — COMPREHENSIVE METABOLIC PANEL
ALK PHOS: 57 U/L (ref 38–126)
ALT: 19 U/L (ref 0–44)
AST: 26 U/L (ref 15–41)
Albumin: 3.5 g/dL (ref 3.5–5.0)
Anion gap: 13 (ref 5–15)
BUN: 19 mg/dL (ref 8–23)
CO2: 23 mmol/L (ref 22–32)
CREATININE: 0.7 mg/dL (ref 0.44–1.00)
Calcium: 9.5 mg/dL (ref 8.9–10.3)
Chloride: 100 mmol/L (ref 98–111)
GFR calc Af Amer: >60 mL/min (ref >60)
GFR calc non Af Amer: >60 mL/min (ref >60)
Glucose, Bld: 94 mg/dL (ref 70–99)
Potassium: 4 mmol/L (ref 3.5–5.1)
Sodium: 136 mmol/L (ref 135–145)
Total Bilirubin: 1.3 mg/dL — ABNORMAL HIGH (ref 0.3–1.2)
Total Protein: 6.7 g/dL (ref 6.5–8.1)

## 2018-05-28 LAB — CBC
HCT: 41.9 % (ref 36.0–46.0)
HEMOGLOBIN: 13 g/dL (ref 12.0–15.0)
MCH: 29.1 pg (ref 26.0–34.0)
MCHC: 31 g/dL (ref 30.0–36.0)
MCV: 93.9 fL (ref 80.0–100.0)
Platelets: 360 10*3/uL (ref 150–400)
RBC: 4.46 MIL/uL (ref 3.87–5.11)
RDW: 14.6 % (ref 11.5–15.5)
WBC: 5.9 10*3/uL (ref 4.0–10.5)
nRBC: 0 % (ref 0.0–0.2)

## 2018-05-28 MED ORDER — ACETAMINOPHEN 325 MG PO TABS: 650.0000 mg | ORAL_TABLET | ORAL | Status: DC

## 2018-05-28 MED ORDER — SODIUM CHLORIDE 0.9 % IV SOLN
2.0000 mg/kg | INTRAVENOUS | Status: DC
  Administered 2018-05-28: 113.75 mg via INTRAVENOUS
  Filled 2018-05-28: qty 9.1

## 2018-05-28 MED ORDER — DIPHENHYDRAMINE HCL 25 MG PO CAPS: 25.0000 mg | ORAL_CAPSULE | ORAL | Status: DC

## 2018-05-28 NOTE — Progress Notes (Signed)
WNL

## 2018-06-03 ENCOUNTER — Ambulatory Visit: Payer: Medicare Other | Admitting: Physician Assistant

## 2018-06-15 ENCOUNTER — Other Ambulatory Visit: Payer: Self-pay | Admitting: Rheumatology

## 2018-06-15 NOTE — Telephone Encounter (Signed)
Last Visit: 03/26/18 Next Visit: 09/01/18 Labs: 05/28/18 WNL  Okay to refill per Dr. Deveshwar  

## 2018-06-22 ENCOUNTER — Other Ambulatory Visit: Payer: Self-pay | Admitting: Rheumatology

## 2018-06-22 MED ORDER — METHOTREXATE SODIUM CHEMO INJECTION 50 MG/2ML: 20.0000 mg | INTRAMUSCULAR | 0 refills | Status: DC

## 2018-06-22 NOTE — Telephone Encounter (Signed)
Last Visit: 03/26/18 Next Visit: 09/01/18 Labs: 05/28/18 WNL  Okay to refill per Dr. Corliss Skains

## 2018-07-16 ENCOUNTER — Encounter: Payer: Self-pay | Admitting: Rheumatology

## 2018-07-16 NOTE — Telephone Encounter (Signed)
Attempted to contact patient and patient's husband will have her call the office.

## 2018-07-16 NOTE — Telephone Encounter (Signed)
Advised patient that [facility] is infusing on a normal schedule and if patient is comfortable she can proceed on normal infusion schedule. Advised patient as long as she does not have any signs or symptoms of sickness/infections she can proceed with infusion.

## 2018-07-22 ENCOUNTER — Other Ambulatory Visit: Payer: Self-pay

## 2018-07-23 ENCOUNTER — Ambulatory Visit (HOSPITAL_COMMUNITY)
Admission: RE | Admit: 2018-07-23 | Discharge: 2018-07-23 | Disposition: A | Discharge status: Home / Self Care | Payer: Medicare Other | Source: Ambulatory Visit | Attending: Rheumatology | Admitting: Rheumatology

## 2018-07-23 DIAGNOSIS — L4050 Arthropathic psoriasis, unspecified: Secondary | ICD-10-CM | POA: Diagnosis present

## 2018-07-23 LAB — COMPREHENSIVE METABOLIC PANEL
ALT: 16 U/L (ref 0–44)
AST: 22 U/L (ref 15–41)
Albumin: 3.5 g/dL (ref 3.5–5.0)
Alkaline Phosphatase: 63 U/L (ref 38–126)
Anion gap: 4 — ABNORMAL LOW (ref 5–15)
BUN: 19 mg/dL (ref 8–23)
CO2: 25 mmol/L (ref 22–32)
Calcium: 8.7 mg/dL — ABNORMAL LOW (ref 8.9–10.3)
Chloride: 107 mmol/L (ref 98–111)
Creatinine, Ser: 0.59 mg/dL (ref 0.44–1.00)
GFR calc Af Amer: >60 mL/min (ref >60)
GFR calc non Af Amer: >60 mL/min (ref >60)
Glucose, Bld: 91 mg/dL (ref 70–99)
Potassium: 4.1 mmol/L (ref 3.5–5.1)
Sodium: 136 mmol/L (ref 135–145)
Total Bilirubin: 0.8 mg/dL (ref 0.3–1.2)
Total Protein: 6.2 g/dL — ABNORMAL LOW (ref 6.5–8.1)

## 2018-07-23 LAB — CBC
HCT: 39.3 % (ref 36.0–46.0)
Hemoglobin: 12.3 g/dL (ref 12.0–15.0)
MCH: 29.4 pg (ref 26.0–34.0)
MCHC: 31.3 g/dL (ref 30.0–36.0)
MCV: 94 fL (ref 80.0–100.0)
Platelets: 296 10*3/uL (ref 150–400)
RBC: 4.18 MIL/uL (ref 3.87–5.11)
RDW: 15.4 % (ref 11.5–15.5)
WBC: 6.2 10*3/uL (ref 4.0–10.5)
nRBC: 0 % (ref 0.0–0.2)

## 2018-07-23 MED ORDER — ACETAMINOPHEN 325 MG PO TABS: 650.0000 mg | ORAL_TABLET | ORAL | Status: DC

## 2018-07-23 MED ORDER — SODIUM CHLORIDE 0.9 % IV SOLN
2.0000 mg/kg | INTRAVENOUS | Status: DC
  Administered 2018-07-23: 12:00:00 113.75 mg via INTRAVENOUS
  Filled 2018-07-23: qty 9.1

## 2018-07-23 MED ORDER — DIPHENHYDRAMINE HCL 25 MG PO CAPS: 25.0000 mg | ORAL_CAPSULE | ORAL | Status: DC

## 2018-07-23 NOTE — Progress Notes (Signed)
Calcium is low.  Please advise her to take calcium supplement.  Between dietary and calcium supplement total calcium intake should be approximately 1200 mg.

## 2018-07-27 ENCOUNTER — Encounter: Payer: Self-pay | Admitting: Rheumatology

## 2018-07-27 MED ORDER — DICLOFENAC SODIUM 1 % TD GEL: TRANSDERMAL | 2 refills | Status: DC

## 2018-07-27 NOTE — Telephone Encounter (Signed)
Last Visit: 03/26/18 Next Visit: 09/01/18  Okay to refill per Dr. Corliss Skains

## 2018-08-19 ENCOUNTER — Telehealth: Payer: Self-pay | Admitting: Rheumatology

## 2018-08-19 ENCOUNTER — Encounter: Payer: Self-pay | Admitting: Rheumatology

## 2018-08-19 NOTE — Telephone Encounter (Signed)
Patient called stating she is scheduled for her infusion on 09/17/18.  Patient states she usually schedules an appointment with Dr. Corliss Skains between infusions, but  "due to the coronavirus" she is scheduled on 09/30/18.

## 2018-08-20 NOTE — Telephone Encounter (Signed)
Patient advised that she can continue to have her infusion as scheduled unless she becomes sick or is started on antibiotics before her infusion. Patient verbalized understanding.

## 2018-09-01 ENCOUNTER — Ambulatory Visit: Payer: Self-pay | Admitting: Rheumatology

## 2018-09-15 ENCOUNTER — Other Ambulatory Visit: Payer: Self-pay | Admitting: Rheumatology

## 2018-09-15 NOTE — Telephone Encounter (Signed)
Last Visit: 03/26/18 Next Visit: 09/01/18 Labs:4/2/20CBC WNL Calcium is low.   Okay to refill per Dr.Deveshwar

## 2018-09-17 ENCOUNTER — Encounter (HOSPITAL_COMMUNITY)
Admission: RE | Admit: 2018-09-17 | Discharge: 2018-09-17 | Disposition: A | Discharge status: Home / Self Care | Payer: Medicare Other | Source: Ambulatory Visit | Attending: Rheumatology | Admitting: Rheumatology

## 2018-09-17 ENCOUNTER — Other Ambulatory Visit: Payer: Self-pay

## 2018-09-17 DIAGNOSIS — L4050 Arthropathic psoriasis, unspecified: Secondary | ICD-10-CM

## 2018-09-17 LAB — CBC
HCT: 39 % (ref 36.0–46.0)
Hemoglobin: 12.6 g/dL (ref 12.0–15.0)
MCH: 31 pg (ref 26.0–34.0)
MCHC: 32.3 g/dL (ref 30.0–36.0)
MCV: 95.8 fL (ref 80.0–100.0)
Platelets: 294 10*3/uL (ref 150–400)
RBC: 4.07 MIL/uL (ref 3.87–5.11)
RDW: 14.9 % (ref 11.5–15.5)
WBC: 5.9 10*3/uL (ref 4.0–10.5)
nRBC: 0 % (ref 0.0–0.2)

## 2018-09-17 LAB — COMPREHENSIVE METABOLIC PANEL
ALT: 13 U/L (ref 0–44)
AST: 21 U/L (ref 15–41)
Albumin: 3.6 g/dL (ref 3.5–5.0)
Alkaline Phosphatase: 66 U/L (ref 38–126)
Anion gap: 9 (ref 5–15)
BUN: 17 mg/dL (ref 8–23)
CO2: 23 mmol/L (ref 22–32)
Calcium: 9.1 mg/dL (ref 8.9–10.3)
Chloride: 105 mmol/L (ref 98–111)
Creatinine, Ser: 0.59 mg/dL (ref 0.44–1.00)
GFR calc Af Amer: >60 mL/min (ref >60)
GFR calc non Af Amer: >60 mL/min (ref >60)
Glucose, Bld: 92 mg/dL (ref 70–99)
Potassium: 4.4 mmol/L (ref 3.5–5.1)
Sodium: 137 mmol/L (ref 135–145)
Total Bilirubin: 0.6 mg/dL (ref 0.3–1.2)
Total Protein: 6.5 g/dL (ref 6.5–8.1)

## 2018-09-17 MED ORDER — SODIUM CHLORIDE 0.9 % IV SOLN
2.0000 mg/kg | INTRAVENOUS | Status: DC
  Administered 2018-09-17: 12:00:00 115 mg via INTRAVENOUS
  Filled 2018-09-17 (×2): qty 9.2

## 2018-09-17 MED ORDER — ACETAMINOPHEN 325 MG PO TABS: 650.0000 mg | ORAL_TABLET | ORAL | Status: DC

## 2018-09-17 MED ORDER — DIPHENHYDRAMINE HCL 25 MG PO CAPS: 25.0000 mg | ORAL_CAPSULE | ORAL | Status: DC

## 2018-09-17 NOTE — Progress Notes (Signed)
Office Visit Note  Patient: Amy Hayes             Date of Birth: 11-27-1945           MRN: [id number]             PCP: Milda Smart, MD Referring: Milda Smart, MD Visit Date: 09/30/2018 Occupation: @GUAROCC @  Subjective:  Lower back pain     History of Present Illness: Amy Hayes is a 73 y.o. female with history of psoriatic arthritis, osteoarthritis, and DDD.  She is on [name] Aria IV infusions every 8 weeks, MTX 0.8 ml sq once weekly and folic acid 2 mg po daily. She denies any psoriatic arthritis flares. She denies any pain in her hands or feet at this time.  She reports she chronic lower back pain.  She has been trying to stay active and exercise regularly.  She denies neck pain at this time.  She states at times she feels a tender mass on the back of her left thigh.  She states at times it feels like a muscle cramp.    Activities of Daily Living:  Patient reports morning stiffness for 10 minutes.   Patient Denies nocturnal pain.  Difficulty dressing/grooming: Denies Difficulty climbing stairs: Denies Difficulty getting out of chair: Reports Difficulty using hands for taps, buttons, cutlery, and/or writing: Denies  Review of Systems  Constitutional: Positive for fatigue.  HENT: Negative for mouth sores, mouth dryness and nose dryness.   Eyes: Positive for dryness. Negative for pain and visual disturbance.  Respiratory: Negative for cough, hemoptysis, shortness of breath and difficulty breathing.   Cardiovascular: Negative for chest pain, palpitations, hypertension and swelling in legs/feet.  Gastrointestinal: Negative for blood in stool, constipation and diarrhea.  Endocrine: Negative for increased urination.  Genitourinary: Negative for painful urination.  Musculoskeletal: Positive for arthralgias, joint pain and morning stiffness. Negative for joint swelling, myalgias, muscle weakness, muscle tenderness and myalgias.  Skin: Negative for color change,  pallor, rash, hair loss, nodules/bumps, skin tightness, ulcers and sensitivity to sunlight.  Allergic/Immunologic: Negative for susceptible to infections.  Neurological: Negative for dizziness, numbness, headaches and weakness.  Hematological: Negative for swollen glands.  Psychiatric/Behavioral: Negative for depressed mood and sleep disturbance. The patient is not nervous/anxious.     PMFS History:  Patient Active Problem List   Diagnosis Date Noted  . DDD (degenerative disc disease), lumbar 07/24/2017  . Primary osteoarthritis of both hands 03/04/2017  . DDD (degenerative disc disease), cervical s/p Fusion  10/30/2016  . Raynaud's disease without gangrene 10/30/2016  . History of migraine 10/30/2016  . History of gastroesophageal reflux (GERD) 10/30/2016  . High risk medication use 10/30/2016  . Psoriasis 05/02/2016  . Bilateral low back pain with sciatica 05/02/2016  . Osteopenia 05/02/2016    Past Medical History:  Diagnosis Date  . GERD (gastroesophageal reflux disease)   . Osteoporosis     History reviewed. No pertinent family history. Past Surgical History:  Procedure Laterality Date  . ABDOMINAL HYSTERECTOMY    . CARPAL TUNNEL RELEASE    . CERVICAL FUSION    . EYE SURGERY    . ingrown toenail removal    . KNEE ARTHROSCOPY Right   . KNEE CARTILAGE SURGERY    . ROTATOR CUFF REPAIR    . SPINAL FUSION     cervical fusion   Social History   Social History Narrative  . Not on file   Immunization History  Administered Date(s) Administered  .  Influenza Split 02/15/2010  . Influenza, High Dose Seasonal PF 02/06/2015, 01/23/2016, 01/20/2017, 02/20/2018  . Influenza, Seasonal, Injecte, Preservative Fre 02/11/2011, 01/04/2013, 04/05/2014  . Influenza,trivalent, recombinat, inj, PF 01/31/2012  . Pneumococcal Conjugate-13 08/11/2014  . Pneumococcal Polysaccharide-23 05/29/2009, 08/29/2015  . Td 07/06/2018  . Tdap 12/17/2007  . Zoster Recombinat (Shingrix) 02/27/2018,  06/12/2018     Objective: Vital Signs: BP (!) 153/66 (BP Location: Left Arm, Patient Position: Sitting, Cuff Size: Small)   Pulse (!) 59   Resp 12   Ht 5' (1.524 m)   Wt 126 lb 12.8 oz (57.5 kg)   BMI 24.76 kg/m    Physical Exam Vitals signs and nursing note reviewed.  Constitutional:      Appearance: She is well-developed.  HENT:     Head: Normocephalic and atraumatic.  Eyes:     Conjunctiva/sclera: Conjunctivae normal.  Neck:     Musculoskeletal: Normal range of motion.  Cardiovascular:     Rate and Rhythm: Normal rate and regular rhythm.     Heart sounds: Normal heart sounds.  Pulmonary:     Effort: Pulmonary effort is normal.     Breath sounds: Normal breath sounds.  Abdominal:     General: Bowel sounds are normal.     Palpations: Abdomen is soft.  Lymphadenopathy:     Cervical: No cervical adenopathy.  Skin:    General: Skin is warm and dry.     Capillary Refill: Capillary refill takes less than 2 seconds.  Neurological:     Mental Status: She is alert and oriented to person, place, and time.  Psychiatric:        Behavior: Behavior normal.      Musculoskeletal Exam:C-spine limited ROM with lateral rotation.   Thoracic and lumbar spine good ROM.  No midline spinal tenderness.  No SI joint tenderness. Shoulder joints, elbow joints, wrist joints, MCPs, PIPs, and DIPs good ROM with no synovitis.   Severe PIP and DIP synovial thickening. Hip joints, knee joints, ankle joints, MTPs, PIPs, and DIPs good ROM with no synovitis.  No warmth or effusion of knee joints.  No tenderness or swelling of ankle joints.   10 cm by 7 cm mobile mass palpated on the posterior aspect of the left lower extremity.   CDAI Exam: CDAI Score: Not documented Patient Global Assessment: Not documented; Provider Global Assessment: Not documented Swollen: Not documented; Tender: Not documented Joint Exam   Not documented   There is currently no information documented on the homunculus. Go to  the Rheumatology activity and complete the homunculus joint exam.  Investigation: No additional findings.  Imaging: Xr Femur Min 2 Views Left  Result Date: 09/30/2018 X-ray of the femur was unremarkable .  No soft tissue swelling was noted.   Recent Labs: Lab Results  Component Value Date   WBC 5.9 09/17/2018   HGB 12.6 09/17/2018   PLT 294 09/17/2018   NA 137 09/17/2018   K 4.4 09/17/2018   CL 105 09/17/2018   CO2 23 09/17/2018   GLUCOSE 92 09/17/2018   BUN 17 09/17/2018   CREATININE 0.59 09/17/2018   BILITOT 0.6 09/17/2018   ALKPHOS 66 09/17/2018   AST 21 09/17/2018   ALT 13 09/17/2018   PROT 6.5 09/17/2018   ALBUMIN 3.6 09/17/2018   CALCIUM 9.1 09/17/2018   GFRAA >60 09/17/2018   QFTBGOLDPLUS Negative 07/22/2017    Speciality Comments: [name] Aria infusions every 8 weeks, started on 11/2016 TB gold negative 08/01/16  Procedures:  No procedures  performed Allergies: Codeine and Iodine    Assessment / Plan:     Visit Diagnoses: Psoriatic arthropathy (HCC): She has no synovitis or dactylitis on exam.  She has severe PIP and DIP synovial thickening consistent with osteoarthritis and psoriatic arthritis overlap.  She is on [name] Aria IV infusions every 8 weeks.  Last infusion was on 09/17/2018.  She continues to inject methotrexate 0.8 mL subcutaneously every week and takes folic acid 2 mg by mouth daily.  She has not missed any doses recently.  She has no SI joint tenderness on exam.  She has no Achilles tendinitis or plantar fasciitis.  She will continue on the current treatment regimen.  A refill of methotrexate was sent to the pharmacy today.  She was advised to notify us that shows increased joint pain or joint swelling.  She will follow-up in the office in 5 months.  Psoriasis: She has no active psoriasis.   High risk medication use -She is on [name] Aria IV infusion every 8 weeks (last infusion 09/17/2018), methotrexate 0.8 mL's every 7 days and folic acid 1 mg 2  tablets daily.  Last TB gold negative on 07/22/2017.  TB Gold ordered for today and will monitor yearly.  Most recent CBC/CMP within normal limits on 09/17/2018 and will monitor with each infusion.  She received her high-dose flu vaccine in November and is up-to-date with her pneumonia and shingles vaccines. (Humira -discontinued due to insurance coverage).   - Plan: QuantiFERON-TB Gold Plus  Primary osteoarthritis of both hands: She has severe PIP and DIP synovial thickening consistent with osteoarthritis and psoriatic arthritis overlap.  She has no synovitis on exam.  She has no discomfort in her hands at this time.  She has no difficulty with ADLs.  Joint protection and muscle strengthening were discussed.   Raynaud's disease without gangrene She has intermittent symptoms of Raynaud's.  She has no digital ulcerations or signs of gangrene.   Osteopenia of multiple sites: She takes a calcium and vitamin D supplement.   DDD (degenerative disc disease), cervical: She has limited ROM with lateral rotation.  She has no discomfort at this time.  She has no symptoms of radiculopathy at this time.   DDD (degenerative disc disease), lumbar: Chronic pain.  She has good ROM with no midline spinal tenderness at this time. She wears a supportive back belt when performing activities.    Trochanteric bursitis, left hip: Resolved   Mass of leg, left: She has a 10 cm by 7 cm tender mobile mass on the posterior aspect of the left lower extremity.  She experiences pain and cramping intermittently.  X-rays of the left femur were obtained today.  X-ray of the femur was unremarkable.  I will schedule MRI of the left thigh.  Other medical conditions are listed as follows:   History of gastroesophageal reflux (GERD)  History of migraine   Orders: Orders Placed This Encounter  Procedures  . XR FEMUR MIN 2 VIEWS LEFT  . QuantiFERON-TB Gold Plus   Meds ordered this encounter  Medications  . methotrexate 50 MG/2ML  injection    Sig: Inject 0.8 mLs (20 mg total) into the skin once a week.    Dispense:  10 mL    Refill:  0    Please dispense 2 mL vials with preservatives.    Face-to-face time spent with patient was 30 minutes. Greater than 50% of time was spent in counseling and coordination of care.  Follow-Up Instructions: Return in about  5 months (around 03/02/2019) for Psoriatic arthritis, Osteoarthritis.   Sherron Alesaylor [place] Veldman PA-C  I examined and evaluated the patient with Sherron Alesaylor Ollis Daudelin PA.  Patient has no synovitis on examination.  Her arthritis seems to be quite well controlled with methotrexate and [name] Aria combination.  She was having pain and discomfort over the posterior aspect of her left thigh.  She had a palpable freely mobile mass.  The x-ray of the thigh was unremarkable.  We will schedule MRI of the thigh.  The plan of care was discussed as noted above.  Pollyann SavoyShaili Deveshwar, MD  Note - This record has been created using Animal nutritionistDragon software.  Chart creation errors have been sought, but may not always  have been located. Such creation errors do not reflect on  the standard of medical care.

## 2018-09-18 ENCOUNTER — Other Ambulatory Visit: Payer: Self-pay | Admitting: Rheumatology

## 2018-09-18 NOTE — Telephone Encounter (Signed)
Last Visit: 03/26/18 Next Visit: 09/30/18  Okay to refill per Dr. Corliss Skains

## 2018-09-30 ENCOUNTER — Other Ambulatory Visit: Payer: Self-pay

## 2018-09-30 ENCOUNTER — Other Ambulatory Visit: Payer: Self-pay | Admitting: *Deleted

## 2018-09-30 ENCOUNTER — Encounter: Payer: Self-pay | Admitting: Physician Assistant

## 2018-09-30 ENCOUNTER — Ambulatory Visit (INDEPENDENT_AMBULATORY_CARE_PROVIDER_SITE_OTHER): Payer: Medicare Other | Admitting: Physician Assistant

## 2018-09-30 ENCOUNTER — Ambulatory Visit (INDEPENDENT_AMBULATORY_CARE_PROVIDER_SITE_OTHER): Payer: Medicare Other

## 2018-09-30 VITALS — BP 153/66 | HR 59 | Resp 12 | Ht 60.0 in | Wt 126.8 lb

## 2018-09-30 DIAGNOSIS — Z79899 Other long term (current) drug therapy: Secondary | ICD-10-CM | POA: Diagnosis not present

## 2018-09-30 DIAGNOSIS — L409 Psoriasis, unspecified: Secondary | ICD-10-CM | POA: Diagnosis not present

## 2018-09-30 DIAGNOSIS — M7062 Trochanteric bursitis, left hip: Secondary | ICD-10-CM

## 2018-09-30 DIAGNOSIS — R2242 Localized swelling, mass and lump, left lower limb: Secondary | ICD-10-CM | POA: Diagnosis not present

## 2018-09-30 DIAGNOSIS — M51369 Other intervertebral disc degeneration, lumbar region without mention of lumbar back pain or lower extremity pain: Secondary | ICD-10-CM

## 2018-09-30 DIAGNOSIS — Z8719 Personal history of other diseases of the digestive system: Secondary | ICD-10-CM

## 2018-09-30 DIAGNOSIS — I7300 Raynaud's syndrome without gangrene: Secondary | ICD-10-CM

## 2018-09-30 DIAGNOSIS — M5136 Other intervertebral disc degeneration, lumbar region: Secondary | ICD-10-CM

## 2018-09-30 DIAGNOSIS — L4050 Arthropathic psoriasis, unspecified: Secondary | ICD-10-CM

## 2018-09-30 DIAGNOSIS — M8589 Other specified disorders of bone density and structure, multiple sites: Secondary | ICD-10-CM

## 2018-09-30 DIAGNOSIS — M19041 Primary osteoarthritis, right hand: Secondary | ICD-10-CM | POA: Diagnosis not present

## 2018-09-30 DIAGNOSIS — Z8669 Personal history of other diseases of the nervous system and sense organs: Secondary | ICD-10-CM

## 2018-09-30 DIAGNOSIS — M19042 Primary osteoarthritis, left hand: Secondary | ICD-10-CM

## 2018-09-30 DIAGNOSIS — M5030 Other cervical disc degeneration, unspecified cervical region: Secondary | ICD-10-CM

## 2018-09-30 DIAGNOSIS — IMO0002 Reserved for concepts with insufficient information to code with codable children: Secondary | ICD-10-CM

## 2018-09-30 MED ORDER — METHOTREXATE SODIUM CHEMO INJECTION 50 MG/2ML: 20.0000 mg | INTRAMUSCULAR | 0 refills | Status: DC

## 2018-10-02 LAB — QUANTIFERON-TB GOLD PLUS
Mitogen-NIL: 8.14 IU/mL
NIL: 0.05 IU/mL
QuantiFERON-TB Gold Plus: NEGATIVE
TB1-NIL: 0.01 IU/mL
TB2-NIL: 0.01 IU/mL

## 2018-10-02 NOTE — Progress Notes (Signed)
TB gold negative

## 2018-10-08 ENCOUNTER — Other Ambulatory Visit: Payer: Self-pay

## 2018-10-08 ENCOUNTER — Ambulatory Visit (HOSPITAL_COMMUNITY)
Admission: RE | Admit: 2018-10-08 | Discharge: 2018-10-08 | Disposition: A | Discharge status: Home / Self Care | Payer: Medicare Other | Source: Ambulatory Visit | Attending: Rheumatology | Admitting: Rheumatology

## 2018-10-08 DIAGNOSIS — IMO0002 Reserved for concepts with insufficient information to code with codable children: Secondary | ICD-10-CM

## 2018-10-08 DIAGNOSIS — R229 Localized swelling, mass and lump, unspecified: Secondary | ICD-10-CM | POA: Diagnosis present

## 2018-10-09 NOTE — Progress Notes (Signed)
I discussed the results with the patient.  She has partially torn hamstring tendon.  I offered orthopedic referral but she would prefer to wait at this point.

## 2018-10-29 ENCOUNTER — Other Ambulatory Visit: Payer: Self-pay | Admitting: *Deleted

## 2018-10-29 NOTE — Progress Notes (Signed)
Infusion orders are current for patient CBC CMP Tylenol Benadryl appointments are up to date and follow up appointment  is scheduled TB gold not due yet.  

## 2018-11-12 ENCOUNTER — Other Ambulatory Visit: Payer: Self-pay

## 2018-11-12 ENCOUNTER — Ambulatory Visit (HOSPITAL_COMMUNITY)
Admission: RE | Admit: 2018-11-12 | Discharge: 2018-11-12 | Disposition: A | Discharge status: Home / Self Care | Payer: Medicare Other | Source: Ambulatory Visit | Attending: Rheumatology | Admitting: Rheumatology

## 2018-11-12 DIAGNOSIS — L4050 Arthropathic psoriasis, unspecified: Secondary | ICD-10-CM | POA: Insufficient documentation

## 2018-11-12 LAB — COMPREHENSIVE METABOLIC PANEL
ALT: 16 U/L (ref 0–44)
AST: 25 U/L (ref 15–41)
Albumin: 3.6 g/dL (ref 3.5–5.0)
Alkaline Phosphatase: 56 U/L (ref 38–126)
Anion gap: 9 (ref 5–15)
BUN: 21 mg/dL (ref 8–23)
CO2: 23 mmol/L (ref 22–32)
Calcium: 9.5 mg/dL (ref 8.9–10.3)
Chloride: 106 mmol/L (ref 98–111)
Creatinine, Ser: 0.58 mg/dL (ref 0.44–1.00)
GFR calc Af Amer: >60 mL/min (ref >60)
GFR calc non Af Amer: >60 mL/min (ref >60)
Glucose, Bld: 92 mg/dL (ref 70–99)
Potassium: 4 mmol/L (ref 3.5–5.1)
Sodium: 138 mmol/L (ref 135–145)
Total Bilirubin: 1 mg/dL (ref 0.3–1.2)
Total Protein: 6.5 g/dL (ref 6.5–8.1)

## 2018-11-12 LAB — CBC
HCT: 39.3 % (ref 36.0–46.0)
Hemoglobin: 12.8 g/dL (ref 12.0–15.0)
MCH: 31.1 pg (ref 26.0–34.0)
MCHC: 32.6 g/dL (ref 30.0–36.0)
MCV: 95.4 fL (ref 80.0–100.0)
Platelets: 303 10*3/uL (ref 150–400)
RBC: 4.12 MIL/uL (ref 3.87–5.11)
RDW: 13.9 % (ref 11.5–15.5)
WBC: 6.2 10*3/uL (ref 4.0–10.5)
nRBC: 0 % (ref 0.0–0.2)

## 2018-11-12 MED ORDER — DIPHENHYDRAMINE HCL 25 MG PO CAPS: 25.0000 mg | ORAL_CAPSULE | ORAL | Status: DC

## 2018-11-12 MED ORDER — ACETAMINOPHEN 325 MG PO TABS: 650.0000 mg | ORAL_TABLET | ORAL | Status: DC

## 2018-11-12 MED ORDER — SODIUM CHLORIDE 0.9 % IV SOLN
2.0000 mg/kg | INTRAVENOUS | Status: AC
  Administered 2018-11-12: 115 mg via INTRAVENOUS
  Filled 2018-11-12: qty 9.2

## 2018-11-17 ENCOUNTER — Telehealth: Payer: Self-pay | Admitting: Rheumatology

## 2018-11-17 MED ORDER — LIDOCAINE 5 % EX PTCH: 1.0000 | MEDICATED_PATCH | CUTANEOUS | 0 refills | Status: DC

## 2018-11-17 NOTE — Telephone Encounter (Signed)
Last Visit: 09/30/18 Next Visit: 03/02/19  Okay to refill per Dr. Deveshwar  

## 2018-11-17 NOTE — Telephone Encounter (Signed)
Patient called requesting prescription refill of Lidocaine patches to be sent to [facility] in [place].

## 2018-12-03 ENCOUNTER — Other Ambulatory Visit: Payer: Self-pay | Admitting: Rheumatology

## 2018-12-03 NOTE — Telephone Encounter (Signed)
Last Visit: 09/30/18 Next Visit: 03/02/19  Okay to refill per Dr. Deveshwar  

## 2018-12-16 ENCOUNTER — Other Ambulatory Visit: Payer: Self-pay | Admitting: Rheumatology

## 2018-12-16 NOTE — Telephone Encounter (Signed)
Last Visit: 09/30/18 Next Visit: 03/02/19 Labs: 11/12/18 WNL  Okay to refill per Dr. Estanislado Pandy

## 2018-12-20 ENCOUNTER — Encounter: Payer: Self-pay | Admitting: Rheumatology

## 2018-12-21 NOTE — Progress Notes (Signed)
Office Visit Note  Patient: Amy Hayes             Date of Birth: 30-Jul-1945           MRN: [id number]             PCP: Elliot Dally, MD Referring: Elliot Dally, MD Visit Date: 12/22/2018 Occupation: @GUAROCC @  Subjective:  Lower back pain and SI joint pain.   History of Present Illness: Amy Hayes is a 73 y.o. female with history of psoriatic arthritis, osteoarthritis.  She states that for the last last 3 months she has been having lower back pain.  She states the pain started soon after she gets up in the bed.  It is mostly in the lower back.  She states after walking it starts going to her bilateral SI joints.  She is not having any flare of psoriatic arthritis.  She denies any psoriasis lesions.  Activities of Daily Living:  Patient reports morning stiffness for all day hours.   Patient Denies nocturnal pain.  Difficulty dressing/grooming: Denies Difficulty climbing stairs: Reports Difficulty getting out of chair: Denies Difficulty using hands for taps, buttons, cutlery, and/or writing: Denies  Review of Systems  Constitutional: Positive for fatigue. Negative for night sweats, weight gain and weight loss.  HENT: Negative for mouth sores, trouble swallowing, trouble swallowing, mouth dryness and nose dryness.   Eyes: Negative for pain, redness, visual disturbance and dryness.  Respiratory: Negative for cough, shortness of breath and difficulty breathing.   Cardiovascular: Negative for chest pain, palpitations, hypertension, irregular heartbeat and swelling in legs/feet.  Gastrointestinal: Negative for blood in stool, constipation and diarrhea.  Endocrine: Negative for increased urination.  Genitourinary: Negative for vaginal dryness.  Musculoskeletal: Positive for arthralgias, joint pain, myalgias, morning stiffness and myalgias. Negative for joint swelling, muscle weakness and muscle tenderness.  Skin: Negative for color change, rash, hair loss, skin tightness,  ulcers and sensitivity to sunlight.  Allergic/Immunologic: Negative for susceptible to infections.  Neurological: Negative for dizziness, memory loss, night sweats and weakness.  Hematological: Negative for swollen glands.  Psychiatric/Behavioral: Negative for depressed mood and sleep disturbance. The patient is not nervous/anxious.     PMFS History:  Patient Active Problem List   Diagnosis Date Noted  . Psoriatic arthropathy ([place]) 12/22/2018  . DDD (degenerative disc disease), lumbar 07/24/2017  . Primary osteoarthritis of both hands 03/04/2017  . DDD (degenerative disc disease), cervical s/p Fusion  10/30/2016  . Raynaud's disease without gangrene 10/30/2016  . History of migraine 10/30/2016  . History of gastroesophageal reflux (GERD) 10/30/2016  . High risk medication use 10/30/2016  . Psoriasis 05/02/2016  . Bilateral low back pain with sciatica 05/02/2016  . Osteopenia 05/02/2016    Past Medical History:  Diagnosis Date  . GERD (gastroesophageal reflux disease)   . Osteoporosis     History reviewed. No pertinent family history. Past Surgical History:  Procedure Laterality Date  . ABDOMINAL HYSTERECTOMY    . CARPAL TUNNEL RELEASE    . CERVICAL FUSION    . EYE SURGERY    . ingrown toenail removal    . KNEE ARTHROSCOPY Right   . KNEE CARTILAGE SURGERY    . ROTATOR CUFF REPAIR    . SPINAL FUSION     cervical fusion   Social History   Social History Narrative  . Not on file   Immunization History  Administered Date(s) Administered  . Influenza Split 02/15/2010  . Influenza, High Dose Seasonal PF  02/06/2015, 01/23/2016, 01/20/2017, 02/20/2018  . Influenza, Seasonal, Injecte, Preservative Fre 02/11/2011, 01/04/2013, 04/05/2014  . Influenza,trivalent, recombinat, inj, PF 01/31/2012  . Pneumococcal Conjugate-13 08/11/2014  . Pneumococcal Polysaccharide-23 05/29/2009, 08/29/2015  . Td 07/06/2018  . Tdap 12/17/2007  . Zoster Recombinat (Shingrix) 02/27/2018,  06/12/2018     Objective: Vital Signs: BP (!) 150/67 (BP Location: Left Arm, Patient Position: Sitting, Cuff Size: Small)   Pulse (!) 59   Resp 12   Ht 5' (1.524 m)   Wt 129 lb 6.4 oz (58.7 kg)   BMI 25.27 kg/m    Physical Exam Vitals signs and nursing note reviewed.  Constitutional:      Appearance: She is well-developed.  HENT:     Head: Normocephalic and atraumatic.  Eyes:     Conjunctiva/sclera: Conjunctivae normal.  Neck:     Musculoskeletal: Normal range of motion.  Cardiovascular:     Rate and Rhythm: Normal rate and regular rhythm.     Heart sounds: Normal heart sounds.  Pulmonary:     Effort: Pulmonary effort is normal.     Breath sounds: Normal breath sounds.  Abdominal:     General: Bowel sounds are normal.     Palpations: Abdomen is soft.  Lymphadenopathy:     Cervical: No cervical adenopathy.  Skin:    General: Skin is warm and dry.     Capillary Refill: Capillary refill takes less than 2 seconds.  Neurological:     Mental Status: She is alert and oriented to person, place, and time.  Psychiatric:        Behavior: Behavior normal.      Musculoskeletal Exam: C-spine some limitation of range of motion without discomfort.  She has discomfort range of motion of her lumbar spine.  She had tenderness over bilateral SI joints.  Shoulder joints elbow joints wrist joints in good range of motion.  She has DIP and PIP thickening with some subluxation of the DIPs.  Hip joints and knee joints with good range of motion.  No synovitis was noted on examination.  CDAI Exam: CDAI Score: - Patient Global: -; Provider Global: - Swollen: -; Tender: - Joint Exam   No joint exam has been documented for this visit   There is currently no information documented on the homunculus. Go to the Rheumatology activity and complete the homunculus joint exam.  Investigation: No additional findings.  Imaging: Xr Lumbar Spine 2-3 Views  Result Date: 12/22/2018 Severe levo  scoliosis of the lumbar spine was noted.  Multilevel spondylosis with L3-4 and L4-5 spondylolisthesis was noted.  L1-2 and L2-3 severe narrowing was noted.  Facet joint arthropathy was noted. Impression: These findings are consistent with multilevel spondylosis with severe narrowing between L1-L2 and L2-L3.  Spondylolisthesis and scoliosis was also noted as described above.  Xr Pelvis 1-2 Views  Result Date: 12/22/2018 Bilateral SI joint to sclerosis was noted.  More prominent in the left SI joint. Impression: SI joint to sclerosis consistent with psoriatic arthritis.   Recent Labs: Lab Results  Component Value Date   WBC 6.2 11/12/2018   HGB 12.8 11/12/2018   PLT 303 11/12/2018   NA 138 11/12/2018   K 4.0 11/12/2018   CL 106 11/12/2018   CO2 23 11/12/2018   GLUCOSE 92 11/12/2018   BUN 21 11/12/2018   CREATININE 0.58 11/12/2018   BILITOT 1.0 11/12/2018   ALKPHOS 56 11/12/2018   AST 25 11/12/2018   ALT 16 11/12/2018   PROT 6.5 11/12/2018   ALBUMIN  3.6 11/12/2018   CALCIUM 9.5 11/12/2018   GFRAA >60 11/12/2018   QFTBGOLDPLUS NEGATIVE 09/30/2018    Speciality Comments: [name] Aria infusions every 8 weeks, started on 11/2016 TB gold negative 08/01/16  Procedures:  No procedures performed Allergies: Codeine and Iodine   Assessment / Plan:     Visit Diagnoses: Chronic midline low back pain - -she has been experiencing lower back pain for approximately 3 months now.  She states the pain gets worse after walking for few minutes in the morning.  She has increased pain on bending over.  Plan: XR Lumbar Spine 2-3 Views.  Severe scoliosis with multilevel spondylosis and spondylolisthesis was noted.  Core strengthening and lumbar spine exercises were discussed.  I have advised her to contact me in case she develops any radiculopathy.  Chronic SI joint pain -she has some tenderness on palpation of her bilateral SI joints.  She has been having increased discomfort in the SI joints.  Plan: XR  Pelvis 1-2 Views.  Bilateral SI joint to sclerosis was noted.  More prominent on the left side.  She was having a lot of discomfort in the SI joint after informed consent was obtained the left SI joint was injected with cortisone as described above.  She tolerated the procedure well.  Psoriatic arthropathy (HCC)-she has no active synovitis on examination.  Psoriasis-well-controlled.  High risk medication use - [name] Aria IV 2 mg/kg every 8 weeks (last infusion 11/12/2018), methotrexate 0.8 ml every 7 days, and folic acid 1 mg 2 tablets daily.  Last TB gold negative 09/30/2018 and will monitor yearly.  Last CBC/CMP within normal limits on 11/12/2018 and will monitory with each infusion.  Primary osteoarthritis of both hands-joint protection was discussed.  Raynaud's disease without gangrene-currently not active.  Osteopenia of multiple sites-she is on calcium and vitamin D.  DDD (degenerative disc disease), cervical-she had fusion in the past and has reduced pain.  DDD (degenerative disc disease), lumbar  History of migraine  History of gastroesophageal reflux (GERD)  Orders: Orders Placed This Encounter  Procedures  . XR Lumbar Spine 2-3 Views  . XR Pelvis 1-2 Views   No orders of the defined types were placed in this encounter.    Follow-Up Instructions: Return in about 5 months (around 05/24/2019) for Psoriatic arthritis.   Pollyann Savoy, MD  Note - This record has been created using Animal nutritionist.  Chart creation errors have been sought, but may not always  have been located. Such creation errors do not reflect on  the standard of medical care.

## 2018-12-22 ENCOUNTER — Ambulatory Visit (INDEPENDENT_AMBULATORY_CARE_PROVIDER_SITE_OTHER): Payer: Medicare Other | Admitting: Rheumatology

## 2018-12-22 ENCOUNTER — Encounter: Payer: Self-pay | Admitting: Physician Assistant

## 2018-12-22 ENCOUNTER — Ambulatory Visit (INDEPENDENT_AMBULATORY_CARE_PROVIDER_SITE_OTHER): Payer: Medicare Other

## 2018-12-22 ENCOUNTER — Other Ambulatory Visit: Payer: Self-pay

## 2018-12-22 VITALS — BP 150/67 | HR 59 | Resp 12 | Ht 60.0 in | Wt 129.4 lb

## 2018-12-22 DIAGNOSIS — M19042 Primary osteoarthritis, left hand: Secondary | ICD-10-CM

## 2018-12-22 DIAGNOSIS — Z8669 Personal history of other diseases of the nervous system and sense organs: Secondary | ICD-10-CM

## 2018-12-22 DIAGNOSIS — M5441 Lumbago with sciatica, right side: Secondary | ICD-10-CM | POA: Diagnosis not present

## 2018-12-22 DIAGNOSIS — G8929 Other chronic pain: Secondary | ICD-10-CM

## 2018-12-22 DIAGNOSIS — M533 Sacrococcygeal disorders, not elsewhere classified: Secondary | ICD-10-CM | POA: Diagnosis not present

## 2018-12-22 DIAGNOSIS — M19041 Primary osteoarthritis, right hand: Secondary | ICD-10-CM

## 2018-12-22 DIAGNOSIS — I7300 Raynaud's syndrome without gangrene: Secondary | ICD-10-CM

## 2018-12-22 DIAGNOSIS — Z79899 Other long term (current) drug therapy: Secondary | ICD-10-CM

## 2018-12-22 DIAGNOSIS — L409 Psoriasis, unspecified: Secondary | ICD-10-CM

## 2018-12-22 DIAGNOSIS — M5136 Other intervertebral disc degeneration, lumbar region: Secondary | ICD-10-CM

## 2018-12-22 DIAGNOSIS — M5030 Other cervical disc degeneration, unspecified cervical region: Secondary | ICD-10-CM

## 2018-12-22 DIAGNOSIS — M5442 Lumbago with sciatica, left side: Secondary | ICD-10-CM

## 2018-12-22 DIAGNOSIS — L4050 Arthropathic psoriasis, unspecified: Secondary | ICD-10-CM | POA: Insufficient documentation

## 2018-12-22 DIAGNOSIS — M4126 Other idiopathic scoliosis, lumbar region: Secondary | ICD-10-CM

## 2018-12-22 DIAGNOSIS — M8589 Other specified disorders of bone density and structure, multiple sites: Secondary | ICD-10-CM

## 2018-12-22 DIAGNOSIS — Z8719 Personal history of other diseases of the digestive system: Secondary | ICD-10-CM

## 2018-12-22 NOTE — Patient Instructions (Signed)

## 2019-01-07 ENCOUNTER — Ambulatory Visit (HOSPITAL_COMMUNITY)
Admission: RE | Admit: 2019-01-07 | Discharge: 2019-01-07 | Disposition: A | Discharge status: Home / Self Care | Payer: Medicare Other | Source: Ambulatory Visit | Attending: Rheumatology | Admitting: Rheumatology

## 2019-01-07 ENCOUNTER — Other Ambulatory Visit: Payer: Self-pay

## 2019-01-07 DIAGNOSIS — L4050 Arthropathic psoriasis, unspecified: Secondary | ICD-10-CM | POA: Diagnosis not present

## 2019-01-07 LAB — COMPREHENSIVE METABOLIC PANEL
ALT: 14 U/L (ref 0–44)
AST: 36 U/L (ref 15–41)
Albumin: 3.7 g/dL (ref 3.5–5.0)
Alkaline Phosphatase: 63 U/L (ref 38–126)
Anion gap: 10 (ref 5–15)
BUN: 19 mg/dL (ref 8–23)
CO2: 20 mmol/L — ABNORMAL LOW (ref 22–32)
Calcium: 9.4 mg/dL (ref 8.9–10.3)
Chloride: 105 mmol/L (ref 98–111)
Creatinine, Ser: 0.61 mg/dL (ref 0.44–1.00)
GFR calc Af Amer: >60 mL/min (ref >60)
GFR calc non Af Amer: >60 mL/min (ref >60)
Glucose, Bld: 87 mg/dL (ref 70–99)
Potassium: 4.9 mmol/L (ref 3.5–5.1)
Sodium: 135 mmol/L (ref 135–145)
Total Bilirubin: 1.1 mg/dL (ref 0.3–1.2)
Total Protein: 6.8 g/dL (ref 6.5–8.1)

## 2019-01-07 LAB — CBC
HCT: 41.4 % (ref 36.0–46.0)
Hemoglobin: 13.7 g/dL (ref 12.0–15.0)
MCH: 32.1 pg (ref 26.0–34.0)
MCHC: 33.1 g/dL (ref 30.0–36.0)
MCV: 97 fL (ref 80.0–100.0)
Platelets: 333 10*3/uL (ref 150–400)
RBC: 4.27 MIL/uL (ref 3.87–5.11)
RDW: 14.6 % (ref 11.5–15.5)
WBC: 6.7 10*3/uL (ref 4.0–10.5)
nRBC: 0 % (ref 0.0–0.2)

## 2019-01-07 MED ORDER — SODIUM CHLORIDE 0.9 % IV SOLN
2.0000 mg/kg | INTRAVENOUS | Status: DC
  Administered 2019-01-07: 11:00:00 113.75 mg via INTRAVENOUS
  Filled 2019-01-07: qty 9.1

## 2019-01-07 MED ORDER — ACETAMINOPHEN 325 MG PO TABS: 650.0000 mg | ORAL_TABLET | ORAL | Status: DC

## 2019-01-07 MED ORDER — DIPHENHYDRAMINE HCL 25 MG PO CAPS: 25.0000 mg | ORAL_CAPSULE | ORAL | Status: DC

## 2019-01-07 NOTE — Progress Notes (Signed)
stable °

## 2019-01-08 ENCOUNTER — Telehealth: Payer: Self-pay | Admitting: Rheumatology

## 2019-01-08 NOTE — Telephone Encounter (Signed)
Spoke with patient and advised per Dr. Estanislado Pandy to have evaluation with PCP.

## 2019-01-08 NOTE — Telephone Encounter (Signed)
Patient called stating Dr. Estanislado Pandy told her to contact the infusion center to see if the pain in her arm was due to her infusion.  Patient states she spoke with Laverne at the infusion center who told her there was no doctor there and to contact Dr. Estanislado Pandy.  Patient states Dr. Estanislado Pandy told her to call back if she wasn't able to talk to a doctor and she could come into the office.  Patient states she "doesn't think there is any point coming into the office."

## 2019-01-23 ENCOUNTER — Other Ambulatory Visit: Payer: Self-pay | Admitting: Physician Assistant

## 2019-01-25 NOTE — Telephone Encounter (Signed)
Last Visit:12/22/18 Next Visit: 03/02/19 Labs: 01/07/19 CMP is stable and CBC is normal.   Okay to refill per Dr. Estanislado Pandy

## 2019-02-01 ENCOUNTER — Encounter: Payer: Self-pay | Admitting: Rheumatology

## 2019-02-16 NOTE — Progress Notes (Signed)
Office Visit Note  Patient: Amy Hayes             Date of Birth: [birth year]           MRN: [id number]             PCP: Milda Smart, MD Referring: Milda Smart, MD Visit Date: 03/02/2019 Occupation: @GUAROCC @  Subjective:  Lower back pain, swelling of collar bone.   History of Present Illness: Amy Hayes is a 73 y.o. female with history of psoriatic arthritis, osteoarthritis and degenerative disc disease.  She states she has been having lot of pain and discomfort in her lumbar spine.  She does not have any C-spine pain currently.  She has noticed prominence of her right collarbone for the last 3 weeks.  None of the other joints are painful.  She has been taking methotrexate and [name] on the regular basis.  Activities of Daily Living:  Patient reports morning stiffness for 24 hours.   Patient Denies nocturnal pain.  Difficulty dressing/grooming: Denies Difficulty climbing stairs: Reports Difficulty getting out of chair: Denies Difficulty using hands for taps, buttons, cutlery, and/or writing: Reports  Review of Systems  Constitutional: Positive for fatigue. Negative for night sweats, weight gain and weight loss.  HENT: Negative for mouth sores, trouble swallowing, trouble swallowing, mouth dryness and nose dryness.   Eyes: Negative for pain, redness, visual disturbance and dryness.  Respiratory: Negative for cough, shortness of breath and difficulty breathing.   Cardiovascular: Negative for chest pain, palpitations, hypertension, irregular heartbeat and swelling in legs/feet.  Gastrointestinal: Negative for blood in stool, constipation and diarrhea.  Endocrine: Negative for heat intolerance and increased urination.  Genitourinary: Negative for difficulty urinating and vaginal dryness.  Musculoskeletal: Positive for arthralgias, joint pain, joint swelling, morning stiffness and muscle tenderness. Negative for myalgias, muscle weakness and myalgias.  Skin: Negative  for color change, rash, hair loss, skin tightness, ulcers and sensitivity to sunlight.  Allergic/Immunologic: Negative for susceptible to infections.  Neurological: Negative for dizziness, numbness, memory loss, night sweats and weakness.  Hematological: Negative for bruising/bleeding tendency and swollen glands.  Psychiatric/Behavioral: Negative for depressed mood and sleep disturbance. The patient is not nervous/anxious.     PMFS History:  Patient Active Problem List   Diagnosis Date Noted  . Psoriatic arthropathy (HCC) 12/22/2018  . DDD (degenerative disc disease), lumbar 07/24/2017  . Primary osteoarthritis of both hands 03/04/2017  . DDD (degenerative disc disease), cervical s/p Fusion  10/30/2016  . Raynaud's disease without gangrene 10/30/2016  . History of migraine 10/30/2016  . History of gastroesophageal reflux (GERD) 10/30/2016  . High risk medication use 10/30/2016  . Psoriasis 05/02/2016  . Bilateral low back pain with sciatica 05/02/2016  . Osteopenia 05/02/2016    Past Medical History:  Diagnosis Date  . Arthritis   . GERD (gastroesophageal reflux disease)   . Osteoporosis     Family History  Problem Relation Age of Onset  . Leukemia Mother   . Stroke Father   . Stroke Brother    Past Surgical History:  Procedure Laterality Date  . ABDOMINAL HYSTERECTOMY    . CARPAL TUNNEL RELEASE    . CERVICAL FUSION    . EYE SURGERY    . ingrown toenail removal    . KNEE ARTHROSCOPY Right   . KNEE CARTILAGE SURGERY    . ROTATOR CUFF REPAIR    . SPINAL FUSION     cervical fusion   Social History   Social  History Narrative  . Not on file   Immunization History  Administered Date(s) Administered  . Influenza Split 02/15/2010  . Influenza, High Dose Seasonal PF 02/06/2015, 01/23/2016, 01/20/2017, 02/20/2018  . Influenza, Seasonal, Injecte, Preservative Fre 02/11/2011, 01/04/2013, 04/05/2014  . Influenza,trivalent, recombinat, inj, PF 01/31/2012  . Pneumococcal  Conjugate-13 08/11/2014  . Pneumococcal Polysaccharide-23 05/29/2009, 08/29/2015  . Td 07/06/2018  . Tdap 12/17/2007  . Zoster Recombinat (Shingrix) 02/27/2018, 06/12/2018     Objective: Vital Signs: BP (!) 152/67 (BP Location: Left Arm, Patient Position: Sitting, Cuff Size: Normal)   Pulse (!) 56   Resp 16   Ht 5' (1.524 m)   Wt 126 lb 12.8 oz (57.5 kg)   BMI 24.76 kg/m    Physical Exam Vitals signs and nursing note reviewed.  Constitutional:      Appearance: She is well-developed.  HENT:     Head: Normocephalic and atraumatic.  Eyes:     Conjunctiva/sclera: Conjunctivae normal.  Neck:     Musculoskeletal: Normal range of motion.  Cardiovascular:     Rate and Rhythm: Normal rate and regular rhythm.     Heart sounds: Normal heart sounds.  Pulmonary:     Effort: Pulmonary effort is normal.     Breath sounds: Normal breath sounds.  Abdominal:     General: Bowel sounds are normal.     Palpations: Abdomen is soft.  Lymphadenopathy:     Cervical: No cervical adenopathy.  Skin:    General: Skin is warm and dry.     Capillary Refill: Capillary refill takes less than 2 seconds.  Neurological:     Mental Status: She is alert and oriented to person, place, and time.  Psychiatric:        Behavior: Behavior normal.      Musculoskeletal Exam: C-spine had limited range of motion.  She had tenderness on palpation over bilateral SI joints.  She had tenderness and swelling over right sternoclavicular joint.  Shoulder joints and elbow joints were in good range of motion.  She has some synovitis of her PIP joints as described below.  Hip joints and knee joints were in good range of motion.  No synovitis was noted over ankles or MTP joints.  CDAI Exam: CDAI Score: 10.8  Patient Global: 3 mm; Provider Global: 5 mm Swollen: 6 ; Tender: 8  Joint Exam      Right  Left  Sternoclavicular  Swollen Tender     PIP 2  Swollen Tender     PIP 3  Swollen Tender  Swollen Tender  PIP 4   Swollen Tender  Swollen Tender  Sacroiliac   Tender   Tender     Investigation: No additional findings.  Imaging: No results found.  Recent Labs: Lab Results  Component Value Date   WBC 6.7 01/07/2019   HGB 13.7 01/07/2019   PLT 333 01/07/2019   NA 135 01/07/2019   K 4.9 01/07/2019   CL 105 01/07/2019   CO2 20 (L) 01/07/2019   GLUCOSE 87 01/07/2019   BUN 19 01/07/2019   CREATININE 0.61 01/07/2019   BILITOT 1.1 01/07/2019   ALKPHOS 63 01/07/2019   AST 36 01/07/2019   ALT 14 01/07/2019   PROT 6.8 01/07/2019   ALBUMIN 3.7 01/07/2019   CALCIUM 9.4 01/07/2019   GFRAA >60 01/07/2019   QFTBGOLDPLUS NEGATIVE 09/30/2018    Speciality Comments: [name] Aria infusions every 8 weeks, started on 11/2016 TB gold negative 08/01/16  Procedures:  No procedures performed Allergies: Codeine  and Iodine   Assessment / Plan:     Visit Diagnoses: Psoriatic arthropathy (HCC)-patient is having a flare of psoriatic arthritis with swelling of multiple joints as described above.  She states her [name] Aria infusion is due this week.  Her methotrexate subcutaneous dose is due tomorrow.  She states usually she feels better after the methotrexate injection.  She is not so sure if [name] has been very effective.  I would avoid prednisone taper as her blood pressure is elevated today.  Have advised her to monitor her blood pressure closely.  If her blood pressure improves then we may consider doing SI joint injections or prednisone taper.  She is generally well on Humira in the past.  She never try to apply for patient assistance.  We may consider applying for patient assistance at the follow-up visit if she had no improvement.  Psoriasis-she has no active lesions of psoriasis currently.  High risk medication use - [name] Aria IV 2 mg/kg every 8 weeks, methotrexate 0.8 ml every 7 days, and folic acid 1 mg 2 tablets daily.  Her labs are stable.  She gets her labs with her infusions.  Primary  osteoarthritis of both hands-joint protection was discussed.  She is quite active at home.  Raynaud's disease without gangrene-currently not very active.  Osteopenia of multiple sites-she gets DEXA scan with her PCP.  Have advised her to discuss with Dr. Kirk Ruths.  DDD (degenerative disc disease), cervical - she had fusion in the past   DDD (degenerative disc disease), lumbar-she complains of lower back pain but the discomfort is mostly over SI joints.  I believe is due to psoriatic arthritis.  History of migraine  History of gastroesophageal reflux (GERD)  Orders: No orders of the defined types were placed in this encounter.  No orders of the defined types were placed in this encounter.    Follow-Up Instructions: Return in about 2 months (around 05/02/2019) for Psoriatic arthritis, Osteoarthritis.   Bo Merino, MD  Note - This record has been created using Editor, commissioning.  Chart creation errors have been sought, but may not always  have been located. Such creation errors do not reflect on  the standard of medical care.

## 2019-02-25 ENCOUNTER — Other Ambulatory Visit: Payer: Self-pay | Admitting: *Deleted

## 2019-02-25 NOTE — Progress Notes (Signed)
Infusion orders are current for patient CBC CMP Tylenol Benadryl appointments are up to date and follow up appointment  is scheduled TB gold not due yet.  

## 2019-03-02 ENCOUNTER — Other Ambulatory Visit: Payer: Self-pay

## 2019-03-02 ENCOUNTER — Encounter: Payer: Self-pay | Admitting: Physician Assistant

## 2019-03-02 ENCOUNTER — Ambulatory Visit (INDEPENDENT_AMBULATORY_CARE_PROVIDER_SITE_OTHER): Payer: Medicare Other | Admitting: Rheumatology

## 2019-03-02 VITALS — BP 152/67 | HR 56 | Resp 16 | Ht 60.0 in | Wt 126.8 lb

## 2019-03-02 DIAGNOSIS — L409 Psoriasis, unspecified: Secondary | ICD-10-CM

## 2019-03-02 DIAGNOSIS — M19041 Primary osteoarthritis, right hand: Secondary | ICD-10-CM

## 2019-03-02 DIAGNOSIS — Z79899 Other long term (current) drug therapy: Secondary | ICD-10-CM | POA: Diagnosis not present

## 2019-03-02 DIAGNOSIS — L4050 Arthropathic psoriasis, unspecified: Secondary | ICD-10-CM | POA: Diagnosis not present

## 2019-03-02 DIAGNOSIS — Z8719 Personal history of other diseases of the digestive system: Secondary | ICD-10-CM

## 2019-03-02 DIAGNOSIS — I7300 Raynaud's syndrome without gangrene: Secondary | ICD-10-CM

## 2019-03-02 DIAGNOSIS — M8589 Other specified disorders of bone density and structure, multiple sites: Secondary | ICD-10-CM

## 2019-03-02 DIAGNOSIS — M5030 Other cervical disc degeneration, unspecified cervical region: Secondary | ICD-10-CM

## 2019-03-02 DIAGNOSIS — M19042 Primary osteoarthritis, left hand: Secondary | ICD-10-CM

## 2019-03-02 DIAGNOSIS — Z8669 Personal history of other diseases of the nervous system and sense organs: Secondary | ICD-10-CM

## 2019-03-02 DIAGNOSIS — M5136 Other intervertebral disc degeneration, lumbar region: Secondary | ICD-10-CM

## 2019-03-04 ENCOUNTER — Ambulatory Visit (HOSPITAL_COMMUNITY)
Admission: RE | Admit: 2019-03-04 | Discharge: 2019-03-04 | Disposition: A | Discharge status: Home / Self Care | Payer: Medicare Other | Source: Ambulatory Visit | Attending: Rheumatology | Admitting: Rheumatology

## 2019-03-04 ENCOUNTER — Other Ambulatory Visit: Payer: Self-pay

## 2019-03-04 DIAGNOSIS — L4050 Arthropathic psoriasis, unspecified: Secondary | ICD-10-CM | POA: Insufficient documentation

## 2019-03-04 LAB — CBC
HCT: 43.6 % (ref 36.0–46.0)
Hemoglobin: 13.7 g/dL (ref 12.0–15.0)
MCH: 30.6 pg (ref 26.0–34.0)
MCHC: 31.4 g/dL (ref 30.0–36.0)
MCV: 97.3 fL (ref 80.0–100.0)
Platelets: 355 10*3/uL (ref 150–400)
RBC: 4.48 MIL/uL (ref 3.87–5.11)
RDW: 14.6 % (ref 11.5–15.5)
WBC: 6.8 10*3/uL (ref 4.0–10.5)
nRBC: 0 % (ref 0.0–0.2)

## 2019-03-04 LAB — COMPREHENSIVE METABOLIC PANEL
ALT: 16 U/L (ref 0–44)
AST: 24 U/L (ref 15–41)
Albumin: 3.8 g/dL (ref 3.5–5.0)
Alkaline Phosphatase: 64 U/L (ref 38–126)
Anion gap: 13 (ref 5–15)
BUN: 21 mg/dL (ref 8–23)
CO2: 20 mmol/L — ABNORMAL LOW (ref 22–32)
Calcium: 9.4 mg/dL (ref 8.9–10.3)
Chloride: 104 mmol/L (ref 98–111)
Creatinine, Ser: 0.64 mg/dL (ref 0.44–1.00)
GFR calc Af Amer: >60 mL/min (ref >60)
GFR calc non Af Amer: >60 mL/min (ref >60)
Glucose, Bld: 101 mg/dL — ABNORMAL HIGH (ref 70–99)
Potassium: 4.5 mmol/L (ref 3.5–5.1)
Sodium: 137 mmol/L (ref 135–145)
Total Bilirubin: 0.8 mg/dL (ref 0.3–1.2)
Total Protein: 7.1 g/dL (ref 6.5–8.1)

## 2019-03-04 MED ORDER — DIPHENHYDRAMINE HCL 25 MG PO CAPS: 25.0000 mg | ORAL_CAPSULE | ORAL | Status: DC

## 2019-03-04 MED ORDER — SODIUM CHLORIDE 0.9 % IV SOLN
2.0000 mg/kg | INTRAVENOUS | Status: AC
  Administered 2019-03-04: 113.75 mg via INTRAVENOUS
  Filled 2019-03-04: qty 9.1

## 2019-03-04 MED ORDER — ACETAMINOPHEN 325 MG PO TABS: 650.0000 mg | ORAL_TABLET | ORAL | Status: DC

## 2019-03-04 NOTE — Progress Notes (Signed)
Labs are stable.

## 2019-03-17 ENCOUNTER — Telehealth: Payer: Self-pay

## 2019-03-17 MED ORDER — CELECOXIB 200 MG PO CAPS: ORAL_CAPSULE | ORAL | 0 refills | Status: DC

## 2019-03-17 NOTE — Telephone Encounter (Signed)
Refill request received via fax from [facility] on [address] in [place].   Last Visit: 03/02/2019 Next Visit: 05/06/2019 Labs: 03/04/2019 stable   Okay to refill per Dr. Estanislado Pandy.

## 2019-03-30 ENCOUNTER — Telehealth: Payer: Self-pay | Admitting: Rheumatology

## 2019-03-30 NOTE — Telephone Encounter (Signed)
Called and spoke to patient and husband, they have enrolled into [facility] Advantage plan next year. Advised that we will need to verify benefits at the beginning of the year once the plan is active to get an accurate benefits review. Asked patient if they will have any additional coverage, husband stated [employer] will be for all benefits including supplemental. Will call and verify benefits on 04/25/2018, patient has [name] infusion scheduled for 04/28/2018.  [employer] Medicare- ID# [id number]  Phone# [phone]

## 2019-03-30 NOTE — Telephone Encounter (Signed)
Patient's husband calling requesting someone call [employer] Medicare to see if they cover [name] infusions? Do we do that, or the hospital? Please call to advise, and if hospital does that, what # do they call? Please call to advise.

## 2019-04-08 ENCOUNTER — Encounter: Payer: Self-pay | Admitting: Rheumatology

## 2019-04-26 NOTE — Telephone Encounter (Signed)
Called [employer] to verify benefits for patient's [name] Aria infusions. Pre-certification was required. Submitted expedited authorization via phone. Advised that patient has infusion scheduled for 04/29/2019. Estimated turnaround time is 72 hours.  [employer] plan- Medicare PPO- Active ID- [id number]  Billing code- [id number]/ [phone]  Ref# [id number]  Phone# [phone]  Called patient to advise, no answer/vm  8:53 AM Dorthula Nettles, CPhT

## 2019-04-28 ENCOUNTER — Other Ambulatory Visit: Payer: Self-pay

## 2019-04-28 DIAGNOSIS — L4050 Arthropathic psoriasis, unspecified: Secondary | ICD-10-CM

## 2019-04-28 NOTE — Telephone Encounter (Signed)
Infusion orders placed for [name] Aria.  

## 2019-04-28 NOTE — Telephone Encounter (Signed)
Called [employer] to check the status of [name] Pre-certification.   Approved- 04/26/2019- 04/21/2020  [employer] plan- Medicare PPO- Active  ID- [id number]  Billing codeShon Hayes- [id number]/ [phone]  Ref# [id number]  Pre-cert Phone# [phone]  Rep Amy Hayes, verified benefits for Jcode- 1602, advised that there is no patient responsibility under this plan.  Benefits- [phone] Ref# [id number]  Called patient/ Husband and advised. Patient is good to receive infusion tomorrow.  9:45 AM Dorthula Nettles, CPhT

## 2019-04-29 ENCOUNTER — Other Ambulatory Visit: Payer: Self-pay

## 2019-04-29 ENCOUNTER — Ambulatory Visit (HOSPITAL_COMMUNITY)
Admission: RE | Admit: 2019-04-29 | Discharge: 2019-04-29 | Disposition: A | Discharge status: Home / Self Care | Payer: Medicare PPO | Source: Ambulatory Visit | Attending: Rheumatology | Admitting: Rheumatology

## 2019-04-29 DIAGNOSIS — L4050 Arthropathic psoriasis, unspecified: Secondary | ICD-10-CM

## 2019-04-29 LAB — CBC
HCT: 41.5 % (ref 36.0–46.0)
Hemoglobin: 13.1 g/dL (ref 12.0–15.0)
MCH: 30.9 pg (ref 26.0–34.0)
MCHC: 31.6 g/dL (ref 30.0–36.0)
MCV: 97.9 fL (ref 80.0–100.0)
Platelets: 345 10*3/uL (ref 150–400)
RBC: 4.24 MIL/uL (ref 3.87–5.11)
RDW: 15.2 % (ref 11.5–15.5)
WBC: 5.6 10*3/uL (ref 4.0–10.5)
nRBC: 0 % (ref 0.0–0.2)

## 2019-04-29 LAB — COMPREHENSIVE METABOLIC PANEL
ALT: 16 U/L (ref 0–44)
AST: 23 U/L (ref 15–41)
Albumin: 3.6 g/dL (ref 3.5–5.0)
Alkaline Phosphatase: 66 U/L (ref 38–126)
Anion gap: 10 (ref 5–15)
BUN: 26 mg/dL — ABNORMAL HIGH (ref 8–23)
CO2: 22 mmol/L (ref 22–32)
Calcium: 9.4 mg/dL (ref 8.9–10.3)
Chloride: 106 mmol/L (ref 98–111)
Creatinine, Ser: 0.65 mg/dL (ref 0.44–1.00)
GFR calc Af Amer: >60 mL/min (ref >60)
GFR calc non Af Amer: >60 mL/min (ref >60)
Glucose, Bld: 92 mg/dL (ref 70–99)
Potassium: 4.6 mmol/L (ref 3.5–5.1)
Sodium: 138 mmol/L (ref 135–145)
Total Bilirubin: 0.7 mg/dL (ref 0.3–1.2)
Total Protein: 6.5 g/dL (ref 6.5–8.1)

## 2019-04-29 MED ORDER — SODIUM CHLORIDE 0.9 % IV SOLN
2.0000 mg/kg | Freq: Once | INTRAVENOUS | Status: AC
  Administered 2019-04-29: 113.75 mg via INTRAVENOUS
  Filled 2019-04-29: qty 9.1

## 2019-04-29 MED ORDER — DIPHENHYDRAMINE HCL 25 MG PO CAPS: 25.0000 mg | ORAL_CAPSULE | Freq: Once | ORAL | Status: DC

## 2019-04-29 MED ORDER — ACETAMINOPHEN 325 MG PO TABS: 650.0000 mg | ORAL_TABLET | Freq: Once | ORAL | Status: DC

## 2019-04-29 NOTE — Progress Notes (Signed)
CBC is normal.

## 2019-04-29 NOTE — Progress Notes (Signed)
CMP normal

## 2019-05-06 ENCOUNTER — Other Ambulatory Visit: Payer: Self-pay

## 2019-05-06 ENCOUNTER — Ambulatory Visit: Payer: PRIVATE HEALTH INSURANCE | Admitting: Rheumatology

## 2019-05-06 MED ORDER — METHOTREXATE SODIUM CHEMO INJECTION 50 MG/2ML: INTRAMUSCULAR | 0 refills | Status: DC

## 2019-05-06 NOTE — Telephone Encounter (Signed)
Last Visit: 03/02/19 Next Visit: 07/01/19 Labs: 04/29/19 CBC is normal and CMP normal

## 2019-05-22 ENCOUNTER — Other Ambulatory Visit: Payer: Self-pay | Admitting: Rheumatology

## 2019-05-24 ENCOUNTER — Telehealth: Payer: Self-pay | Admitting: *Deleted

## 2019-05-24 MED ORDER — LIDOCAINE 5 % EX PTCH: 1.0000 | MEDICATED_PATCH | CUTANEOUS | 0 refills | Status: DC

## 2019-05-24 NOTE — Telephone Encounter (Signed)
Submitted a Prior Authorization request to [facility] for Lidocaine patches via Cover My Meds. Will update once we receive a response.

## 2019-05-24 NOTE — Telephone Encounter (Signed)
Last Visit: 03/02/19 Next Visit: 07/01/19  Okay to refill per Dr. Corliss Skains

## 2019-05-25 NOTE — Telephone Encounter (Signed)
Received notification from [facility] regarding a prior authorization for Lidocaine Patches. Authorization has been APPROVED from 04/23/19 to 04/21/20.   Will send document to scan center.

## 2019-06-18 ENCOUNTER — Other Ambulatory Visit: Payer: Self-pay | Admitting: *Deleted

## 2019-06-18 MED ORDER — CELECOXIB 200 MG PO CAPS: ORAL_CAPSULE | ORAL | 0 refills | Status: DC

## 2019-06-18 NOTE — Telephone Encounter (Signed)
Refill request received via fax  Last Visit: 03/02/19 Next Visit: 07/01/19 Labs: 04/29/19 WNL  Okay to refill per Dr. Corliss Skains

## 2019-06-23 ENCOUNTER — Other Ambulatory Visit: Payer: Self-pay | Admitting: *Deleted

## 2019-06-23 ENCOUNTER — Telehealth: Payer: Self-pay

## 2019-06-23 DIAGNOSIS — L4050 Arthropathic psoriasis, unspecified: Secondary | ICD-10-CM

## 2019-06-23 NOTE — Telephone Encounter (Signed)
Infusion Orders placed.

## 2019-06-23 NOTE — Telephone Encounter (Signed)
Dr. Corliss Skains just received a message via secure chat, requesting patient's [name] infusion orders to be placed for tomorrow. Thanks!

## 2019-06-24 ENCOUNTER — Other Ambulatory Visit: Payer: Self-pay

## 2019-06-24 ENCOUNTER — Ambulatory Visit (HOSPITAL_COMMUNITY)
Admission: RE | Admit: 2019-06-24 | Discharge: 2019-06-24 | Disposition: A | Discharge status: Home / Self Care | Payer: Medicare PPO | Source: Ambulatory Visit | Attending: Rheumatology | Admitting: Rheumatology

## 2019-06-24 DIAGNOSIS — L4050 Arthropathic psoriasis, unspecified: Secondary | ICD-10-CM | POA: Insufficient documentation

## 2019-06-24 LAB — CBC
HCT: 40.3 % (ref 36.0–46.0)
Hemoglobin: 12.9 g/dL (ref 12.0–15.0)
MCH: 30.9 pg (ref 26.0–34.0)
MCHC: 32 g/dL (ref 30.0–36.0)
MCV: 96.4 fL (ref 80.0–100.0)
Platelets: 325 10*3/uL (ref 150–400)
RBC: 4.18 MIL/uL (ref 3.87–5.11)
RDW: 15.1 % (ref 11.5–15.5)
WBC: 6.1 10*3/uL (ref 4.0–10.5)
nRBC: 0 % (ref 0.0–0.2)

## 2019-06-24 MED ORDER — DIPHENHYDRAMINE HCL 25 MG PO CAPS: 25.0000 mg | ORAL_CAPSULE | ORAL | Status: DC

## 2019-06-24 MED ORDER — SODIUM CHLORIDE 0.9 % IV SOLN
2.0000 mg/kg | INTRAVENOUS | Status: DC
  Administered 2019-06-24: 113.75 mg via INTRAVENOUS
  Filled 2019-06-24: qty 9.1

## 2019-06-24 MED ORDER — ACETAMINOPHEN 325 MG PO TABS: 650.0000 mg | ORAL_TABLET | ORAL | Status: DC

## 2019-06-24 NOTE — Progress Notes (Signed)
Office Visit Note  Patient: Amy Hayes             Date of Birth: 09/12/1945           MRN: [id number]             PCP: Elliot Dally, MD Referring: Elliot Dally, MD Visit Date: 07/01/2019 Occupation: @GUAROCC @  Subjective:  Lower back pain.   History of Present Illness: Amy Hayes is a 74 y.o. female with history of psoriatic arthritis, osteoarthritis and degenerative disc disease.  She states she continues to have discomfort in her lower back.  She has been using her back brace which helps only for short time.  She states the pain gets worse in the evening.  She has been also experiencing some radiculopathy in her bilateral lower extremities.  She has an appointment coming up with [facility] for evaluation.  None of the other joints are swollen.  She continues to have some discomfort due to underlying psoriatic arthritis.  Her elbows are dry but no active psoriasis patches.  Activities of Daily Living:  Patient reports morning stiffness for all day hours.   Patient Denies nocturnal pain.  Difficulty dressing/grooming: Denies Difficulty climbing stairs: Reports Difficulty getting out of chair: Denies Difficulty using hands for taps, buttons, cutlery, and/or writing: Denies  Review of Systems  Constitutional: Positive for fatigue. Negative for night sweats, weight gain and weight loss.  HENT: Negative for mouth sores, trouble swallowing, trouble swallowing, mouth dryness and nose dryness.   Eyes: Negative for pain, redness, visual disturbance and dryness.  Respiratory: Negative for cough, shortness of breath and difficulty breathing.   Cardiovascular: Negative for chest pain, palpitations, hypertension, irregular heartbeat and swelling in legs/feet.  Gastrointestinal: Negative for blood in stool, constipation and diarrhea.  Endocrine: Negative for increased urination.  Genitourinary: Negative for vaginal dryness.  Musculoskeletal: Positive for arthralgias,  joint pain and morning stiffness. Negative for joint swelling, myalgias, muscle weakness, muscle tenderness and myalgias.  Skin: Positive for rash. Negative for color change, hair loss, skin tightness, ulcers and sensitivity to sunlight.  Allergic/Immunologic: Negative for susceptible to infections.  Neurological: Negative for dizziness, memory loss, night sweats and weakness.  Hematological: Negative for swollen glands.  Psychiatric/Behavioral: Negative for depressed mood and sleep disturbance. The patient is not nervous/anxious.     PMFS History:  Patient Active Problem List   Diagnosis Date Noted  . Psoriatic arthropathy ([place]) 12/22/2018  . DDD (degenerative disc disease), lumbar 07/24/2017  . Primary osteoarthritis of both hands 03/04/2017  . DDD (degenerative disc disease), cervical s/p Fusion  10/30/2016  . Raynaud's disease without gangrene 10/30/2016  . History of migraine 10/30/2016  . History of gastroesophageal reflux (GERD) 10/30/2016  . High risk medication use 10/30/2016  . Psoriasis 05/02/2016  . Bilateral low back pain with sciatica 05/02/2016  . Osteopenia 05/02/2016    Past Medical History:  Diagnosis Date  . Arthritis   . GERD (gastroesophageal reflux disease)   . Osteoporosis     Family History  Problem Relation Age of Onset  . Leukemia Mother   . Stroke Father   . Hypertension Sister   . Hypertension Brother   . Stroke Brother    Past Surgical History:  Procedure Laterality Date  . ABDOMINAL HYSTERECTOMY    . CARPAL TUNNEL RELEASE    . CERVICAL FUSION    . EYE SURGERY    . ingrown toenail removal    . KNEE ARTHROSCOPY Right   .  KNEE CARTILAGE SURGERY    . ROTATOR CUFF REPAIR    . SPINAL FUSION     cervical fusion   Social History   Social History Narrative  . Not on file   Immunization History  Administered Date(s) Administered  . Influenza Split 02/15/2010  . Influenza, High Dose Seasonal PF 02/06/2015, 01/23/2016, 01/20/2017, 02/20/2018   . Influenza, Seasonal, Injecte, Preservative Fre 02/11/2011, 01/04/2013, 04/05/2014  . Influenza,trivalent, recombinat, inj, PF 01/31/2012  . Pneumococcal Conjugate-13 08/11/2014  . Pneumococcal Polysaccharide-23 05/29/2009, 08/29/2015  . Td 07/06/2018  . Tdap 12/17/2007  . Zoster Recombinat (Shingrix) 02/27/2018, 06/12/2018     Objective: Vital Signs: BP (!) 167/74 (BP Location: Left Arm, Patient Position: Sitting, Cuff Size: Small)   Pulse (!) 57   Resp 12   Ht 5' (1.524 m)   Wt 124 lb 4.8 oz (56.4 kg)   BMI 24.28 kg/m    Physical Exam Vitals and nursing note reviewed.  Constitutional:      Appearance: She is well-developed.  HENT:     Head: Normocephalic and atraumatic.  Eyes:     Conjunctiva/sclera: Conjunctivae normal.  Cardiovascular:     Rate and Rhythm: Normal rate and regular rhythm.     Heart sounds: Normal heart sounds.  Pulmonary:     Effort: Pulmonary effort is normal.     Breath sounds: Normal breath sounds.  Abdominal:     General: Bowel sounds are normal.     Palpations: Abdomen is soft.  Musculoskeletal:     Cervical back: Normal range of motion.  Lymphadenopathy:     Cervical: No cervical adenopathy.  Skin:    General: Skin is warm and dry.     Capillary Refill: Capillary refill takes less than 2 seconds.  Neurological:     Mental Status: She is alert and oriented to person, place, and time.  Psychiatric:        Behavior: Behavior normal.      Musculoskeletal Exam: Limited range of motion of the cervical spine.  Limited range of motion of the lumbar spine with scoliosis.  Shoulder joints, elbow joints, wrist joints in good range of motion.  She has DIP and PIP thickening bilaterally with no synovitis.,  Hip joints, knee joints, ankles were in good range of motion with no synovitis.  CDAI Exam: CDAI Score: -- Patient Global: --; Provider Global: -- Swollen: --; Tender: -- Joint Exam 07/01/2019   No joint exam has been documented for this  visit   There is currently no information documented on the homunculus. Go to the Rheumatology activity and complete the homunculus joint exam.  Investigation: No additional findings.  Imaging: No results found.  Recent Labs: Lab Results  Component Value Date   WBC 6.1 06/24/2019   HGB 12.9 06/24/2019   PLT 325 06/24/2019   NA 138 04/29/2019   K 4.6 04/29/2019   CL 106 04/29/2019   CO2 22 04/29/2019   GLUCOSE 92 04/29/2019   BUN 26 (H) 04/29/2019   CREATININE 0.65 04/29/2019   BILITOT 0.7 04/29/2019   ALKPHOS 66 04/29/2019   AST 23 04/29/2019   ALT 16 04/29/2019   PROT 6.5 04/29/2019   ALBUMIN 3.6 04/29/2019   CALCIUM 9.4 04/29/2019   GFRAA >60 04/29/2019   QFTBGOLDPLUS NEGATIVE 09/30/2018    Speciality Comments: [name] Aria infusions every 8 weeks, started on 11/2016 TB gold negative 08/01/16  Procedures:  No procedures performed Allergies: Codeine and Iodine   Assessment / Plan:  Visit Diagnoses: Psoriatic arthropathy (HCC)-patient had no synovitis on examination today.  She had been doing well on [name] Aria and methotrexate combination.  She does have synovial thickening.  Psoriasis-she had no active lesions of psoriasis.  She had dry patches on her elbows.  High risk medication use - [name] Aria IV 2 mg/kg every 8 weeks, methotrexate 0.8 ml every 7 days, and folic acid 1 mg 2 tablets daily - Plan: QuantiFERON-TB Gold Plus with next labs.  CMP needs to be drawn as well.  CBC recently was normal.  Primary osteoarthritis of both hands-joint protection muscle strengthening was discussed.  Raynaud's disease without gangrene-currently not active as the weather warms up.  Osteopenia of multiple sites - she gets DEXA scan with her PCP.    DDD (degenerative disc disease), cervical-status post fusion she is has limited range of motion.  DDD (degenerative disc disease), lumbar-she continues to have severe lower back pain.  She also has scoliosis.  She has a  spondylolisthesis.  X-ray of the lumbar spine were reviewed.  She has appointment coming up with [facility].  History of migraine  History of gastroesophageal reflux (GERD)  Orders: Orders Placed This Encounter  Procedures  . QuantiFERON-TB Gold Plus   No orders of the defined types were placed in this encounter.   Face-to-face time spent with patient was 30 minutes. Greater than 50% of time was spent in counseling and coordination of care.  Follow-Up Instructions: Return in about 5 months (around 12/01/2019) for Psoriatic arthritis, Osteoarthritis.   Pollyann Savoy, MD  Note - This record has been created using Animal nutritionist.  Chart creation errors have been sought, but may not always  have been located. Such creation errors do not reflect on  the standard of medical care.

## 2019-07-01 ENCOUNTER — Ambulatory Visit (INDEPENDENT_AMBULATORY_CARE_PROVIDER_SITE_OTHER): Payer: Medicare PPO | Admitting: Rheumatology

## 2019-07-01 ENCOUNTER — Encounter: Payer: Self-pay | Admitting: Physician Assistant

## 2019-07-01 ENCOUNTER — Other Ambulatory Visit: Payer: Self-pay

## 2019-07-01 VITALS — BP 167/74 | HR 57 | Resp 12 | Ht 60.0 in | Wt 124.3 lb

## 2019-07-01 DIAGNOSIS — L4050 Arthropathic psoriasis, unspecified: Secondary | ICD-10-CM | POA: Diagnosis not present

## 2019-07-01 DIAGNOSIS — M5136 Other intervertebral disc degeneration, lumbar region: Secondary | ICD-10-CM

## 2019-07-01 DIAGNOSIS — I7300 Raynaud's syndrome without gangrene: Secondary | ICD-10-CM

## 2019-07-01 DIAGNOSIS — L409 Psoriasis, unspecified: Secondary | ICD-10-CM

## 2019-07-01 DIAGNOSIS — Z8669 Personal history of other diseases of the nervous system and sense organs: Secondary | ICD-10-CM

## 2019-07-01 DIAGNOSIS — M19041 Primary osteoarthritis, right hand: Secondary | ICD-10-CM | POA: Diagnosis not present

## 2019-07-01 DIAGNOSIS — Z8719 Personal history of other diseases of the digestive system: Secondary | ICD-10-CM

## 2019-07-01 DIAGNOSIS — Z79899 Other long term (current) drug therapy: Secondary | ICD-10-CM

## 2019-07-01 DIAGNOSIS — M5030 Other cervical disc degeneration, unspecified cervical region: Secondary | ICD-10-CM

## 2019-07-01 DIAGNOSIS — M8589 Other specified disorders of bone density and structure, multiple sites: Secondary | ICD-10-CM

## 2019-07-01 DIAGNOSIS — M19042 Primary osteoarthritis, left hand: Secondary | ICD-10-CM

## 2019-07-11 ENCOUNTER — Other Ambulatory Visit: Payer: Self-pay | Admitting: Rheumatology

## 2019-07-12 MED ORDER — LIDOCAINE 5 % EX PTCH: 1.0000 | MEDICATED_PATCH | CUTANEOUS | 0 refills | Status: DC

## 2019-07-12 NOTE — Telephone Encounter (Signed)
Last Visit: 07/01/19 Next Visit: 12/02/19  Okay to refill per Dr. Deveshwar 

## 2019-07-15 ENCOUNTER — Encounter: Payer: Self-pay | Admitting: Rheumatology

## 2019-07-17 ENCOUNTER — Other Ambulatory Visit: Payer: Self-pay | Admitting: Rheumatology

## 2019-07-19 MED ORDER — METHOTREXATE SODIUM CHEMO INJECTION 50 MG/2ML: INTRAMUSCULAR | 0 refills | Status: DC

## 2019-07-19 NOTE — Telephone Encounter (Signed)
Last Visit: 07/01/19 Next Visit: 12/02/19 Labs: 06/24/19 CBC WNL, 04/29/19 CMP stable   Current Dose per office note on 07/01/19: methotrexate 0.8 ml every 7 days  Okay to refill per Dr. Corliss Skains

## 2019-07-21 ENCOUNTER — Encounter: Payer: Self-pay | Admitting: Rheumatology

## 2019-08-02 ENCOUNTER — Other Ambulatory Visit: Payer: Self-pay | Admitting: Rheumatology

## 2019-08-19 ENCOUNTER — Ambulatory Visit (HOSPITAL_COMMUNITY)
Admission: RE | Admit: 2019-08-19 | Discharge: 2019-08-19 | Disposition: A | Discharge status: Home / Self Care | Payer: Medicare PPO | Source: Ambulatory Visit | Attending: Rheumatology | Admitting: Rheumatology

## 2019-08-19 ENCOUNTER — Other Ambulatory Visit: Payer: Self-pay

## 2019-08-19 DIAGNOSIS — L4050 Arthropathic psoriasis, unspecified: Secondary | ICD-10-CM | POA: Diagnosis not present

## 2019-08-19 LAB — COMPREHENSIVE METABOLIC PANEL
ALT: 19 U/L (ref 0–44)
AST: 28 U/L (ref 15–41)
Albumin: 3.8 g/dL (ref 3.5–5.0)
Alkaline Phosphatase: 61 U/L (ref 38–126)
Anion gap: 13 (ref 5–15)
BUN: 25 mg/dL — ABNORMAL HIGH (ref 8–23)
CO2: 22 mmol/L (ref 22–32)
Calcium: 9.4 mg/dL (ref 8.9–10.3)
Chloride: 102 mmol/L (ref 98–111)
Creatinine, Ser: 0.66 mg/dL (ref 0.44–1.00)
GFR calc Af Amer: >60 mL/min (ref >60)
GFR calc non Af Amer: >60 mL/min (ref >60)
Glucose, Bld: 91 mg/dL (ref 70–99)
Potassium: 4 mmol/L (ref 3.5–5.1)
Sodium: 137 mmol/L (ref 135–145)
Total Bilirubin: 1.1 mg/dL (ref 0.3–1.2)
Total Protein: 6.9 g/dL (ref 6.5–8.1)

## 2019-08-19 LAB — CBC
HCT: 41.7 % (ref 36.0–46.0)
Hemoglobin: 13.8 g/dL (ref 12.0–15.0)
MCH: 32.3 pg (ref 26.0–34.0)
MCHC: 33.1 g/dL (ref 30.0–36.0)
MCV: 97.7 fL (ref 80.0–100.0)
Platelets: 320 10*3/uL (ref 150–400)
RBC: 4.27 MIL/uL (ref 3.87–5.11)
RDW: 14.1 % (ref 11.5–15.5)
WBC: 6 10*3/uL (ref 4.0–10.5)
nRBC: 0 % (ref 0.0–0.2)

## 2019-08-19 MED ORDER — ACETAMINOPHEN 325 MG PO TABS: 650.0000 mg | ORAL_TABLET | ORAL | Status: DC

## 2019-08-19 MED ORDER — SODIUM CHLORIDE 0.9 % IV SOLN
2.0000 mg/kg | INTRAVENOUS | Status: DC
  Administered 2019-08-19: 113.75 mg via INTRAVENOUS
  Filled 2019-08-19: qty 9.1

## 2019-08-19 MED ORDER — DIPHENHYDRAMINE HCL 25 MG PO CAPS: 25.0000 mg | ORAL_CAPSULE | ORAL | Status: DC

## 2019-08-19 NOTE — Progress Notes (Signed)
CBC and CMP normal

## 2019-09-13 ENCOUNTER — Telehealth: Payer: Self-pay

## 2019-09-13 MED ORDER — FOLIC ACID 1 MG PO TABS: ORAL_TABLET | ORAL | 3 refills | Status: DC

## 2019-09-13 MED ORDER — CELECOXIB 200 MG PO CAPS: ORAL_CAPSULE | ORAL | 0 refills | Status: DC

## 2019-09-13 NOTE — Telephone Encounter (Signed)
Refill request received via fax from [facility] in [facility] for folic acid and celebrex.   Last Visit: 07/01/2019 Next Visit: 12/02/2019 Labs: 08/19/2019 CBC and CMP normal    Okay to refill per Dr. Corliss Skains.

## 2019-10-13 ENCOUNTER — Other Ambulatory Visit (HOSPITAL_COMMUNITY): Payer: Self-pay | Admitting: *Deleted

## 2019-10-13 ENCOUNTER — Encounter (HOSPITAL_COMMUNITY): Payer: Medicare PPO

## 2019-10-14 ENCOUNTER — Ambulatory Visit (HOSPITAL_COMMUNITY)
Admission: RE | Admit: 2019-10-14 | Discharge: 2019-10-14 | Disposition: A | Discharge status: Home / Self Care | Payer: Medicare PPO | Source: Ambulatory Visit | Attending: Rheumatology | Admitting: Rheumatology

## 2019-10-14 ENCOUNTER — Other Ambulatory Visit: Payer: Self-pay

## 2019-10-14 DIAGNOSIS — L4050 Arthropathic psoriasis, unspecified: Secondary | ICD-10-CM | POA: Diagnosis not present

## 2019-10-14 LAB — COMPREHENSIVE METABOLIC PANEL
ALT: 11 U/L (ref 0–44)
AST: 28 U/L (ref 15–41)
Albumin: 3.6 g/dL (ref 3.5–5.0)
Alkaline Phosphatase: 68 U/L (ref 38–126)
Anion gap: 11 (ref 5–15)
BUN: 18 mg/dL (ref 8–23)
CO2: 24 mmol/L (ref 22–32)
Calcium: 9.4 mg/dL (ref 8.9–10.3)
Chloride: 102 mmol/L (ref 98–111)
Creatinine, Ser: 0.57 mg/dL (ref 0.44–1.00)
GFR calc Af Amer: >60 mL/min (ref >60)
GFR calc non Af Amer: >60 mL/min (ref >60)
Glucose, Bld: 90 mg/dL (ref 70–99)
Potassium: 4.4 mmol/L (ref 3.5–5.1)
Sodium: 137 mmol/L (ref 135–145)
Total Bilirubin: 0.9 mg/dL (ref 0.3–1.2)
Total Protein: 6.5 g/dL (ref 6.5–8.1)

## 2019-10-14 LAB — CBC
HCT: 41.2 % (ref 36.0–46.0)
Hemoglobin: 13.3 g/dL (ref 12.0–15.0)
MCH: 31.5 pg (ref 26.0–34.0)
MCHC: 32.3 g/dL (ref 30.0–36.0)
MCV: 97.6 fL (ref 80.0–100.0)
Platelets: 314 10*3/uL (ref 150–400)
RBC: 4.22 MIL/uL (ref 3.87–5.11)
RDW: 13.5 % (ref 11.5–15.5)
WBC: 6.7 10*3/uL (ref 4.0–10.5)
nRBC: 0 % (ref 0.0–0.2)

## 2019-10-14 MED ORDER — SODIUM CHLORIDE 0.9 % IV SOLN
2.0000 mg/kg | INTRAVENOUS | Status: DC
  Administered 2019-10-14: 113.75 mg via INTRAVENOUS
  Filled 2019-10-14: qty 9.1

## 2019-10-14 MED ORDER — ACETAMINOPHEN 325 MG PO TABS: 650.0000 mg | ORAL_TABLET | ORAL | Status: DC

## 2019-10-14 MED ORDER — DIPHENHYDRAMINE HCL 25 MG PO CAPS: 25.0000 mg | ORAL_CAPSULE | ORAL | Status: DC

## 2019-10-14 NOTE — Progress Notes (Signed)
Labs are normal.

## 2019-10-16 LAB — QUANTIFERON-TB GOLD PLUS: QuantiFERON-TB Gold Plus: NEGATIVE

## 2019-10-16 LAB — QUANTIFERON-TB GOLD PLUS (RQFGPL)
QuantiFERON Mitogen Value: >10 IU/mL
QuantiFERON Nil Value: 0.19 IU/mL
QuantiFERON TB1 Ag Value: 0.15 IU/mL
QuantiFERON TB2 Ag Value: 0.17 IU/mL

## 2019-10-17 NOTE — Progress Notes (Signed)
CBC, CMP and TB Gold are all within normal limits.  Please order all the future CBC as CBC with differential .

## 2019-10-25 ENCOUNTER — Other Ambulatory Visit: Payer: Self-pay | Admitting: Rheumatology

## 2019-10-26 MED ORDER — LIDOCAINE 5 % EX PTCH: 1.0000 | MEDICATED_PATCH | CUTANEOUS | 0 refills | Status: DC

## 2019-10-26 NOTE — Telephone Encounter (Signed)
Last Visit: 07/01/19 Next Visit: 12/02/19  Okay to refill per Dr. Corliss Skains

## 2019-11-04 ENCOUNTER — Other Ambulatory Visit: Payer: Self-pay | Admitting: Rheumatology

## 2019-11-04 MED ORDER — METHOTREXATE SODIUM CHEMO INJECTION 50 MG/2ML: INTRAMUSCULAR | 0 refills | Status: DC

## 2019-11-04 NOTE — Telephone Encounter (Signed)
Last Visit: 07/01/19 Next Visit: 12/02/19 Labs: 10/14/2019 Labs are normal.  Current Dose per office note on 07/01/2019: methotrexate 0.8 ml every 7 days  Okay to refill per Dr. Corliss Skains

## 2019-12-01 NOTE — Progress Notes (Signed)
Office Visit Note  Patient: Amy Hayes             Date of Birth: 1945/07/24           MRN: [id number]             PCP: Milda Smart, MD Referring: Milda Smart, MD Visit Date: 12/14/2019 Occupation: @GUAROCC @  Subjective:  Lower back pain.   History of Present Illness: Amy Hayes is a 74 y.o. female with history of psoriatic arthritis, psoriasis, degenerative disc disease.  She states she continues to have a lot of pain and discomfort in her lower back.  She has some stiffness in her hands but not much swelling.  She states the only joint which is swollen in her hands is her left fourth finger.  She has been tolerating [name] Aria infusions well.  She is also taking methotrexate on a regular basis.  Activities of Daily Living:  Patient reports morning stiffness for 9 hours.   Patient Reports nocturnal pain.  Difficulty dressing/grooming: Denies Difficulty climbing stairs: Reports Difficulty getting out of chair: Denies Difficulty using hands for taps, buttons, cutlery, and/or writing: Reports  Review of Systems  Constitutional: Positive for fatigue.  HENT: Positive for mouth dryness.   Eyes: Positive for dryness.  Respiratory: Negative for shortness of breath.   Cardiovascular: Negative for swelling in legs/feet.  Gastrointestinal: Negative for constipation.  Endocrine: Negative for excessive thirst.  Genitourinary: Negative for difficulty urinating.  Musculoskeletal: Positive for arthralgias, joint pain, joint swelling, muscle weakness and morning stiffness.  Skin: Negative for rash.  Allergic/Immunologic: Negative for susceptible to infections.  Neurological: Positive for weakness.  Hematological: Positive for bruising/bleeding tendency.  Psychiatric/Behavioral: Negative for sleep disturbance.    PMFS History:  Patient Active Problem List   Diagnosis Date Noted  . Psoriatic arthropathy (HCC) 12/22/2018  . DDD (degenerative disc disease), lumbar  07/24/2017  . Primary osteoarthritis of both hands 03/04/2017  . DDD (degenerative disc disease), cervical s/p Fusion  10/30/2016  . Raynaud's disease without gangrene 10/30/2016  . History of migraine 10/30/2016  . History of gastroesophageal reflux (GERD) 10/30/2016  . High risk medication use 10/30/2016  . Psoriasis 05/02/2016  . Bilateral low back pain with sciatica 05/02/2016  . Osteopenia 05/02/2016    Past Medical History:  Diagnosis Date  . Arthritis   . GERD (gastroesophageal reflux disease)   . Osteoporosis     Family History  Problem Relation Age of Onset  . Leukemia Mother   . Stroke Father   . Hypertension Sister   . Hypertension Brother   . Stroke Brother    Past Surgical History:  Procedure Laterality Date  . ABDOMINAL HYSTERECTOMY    . CARPAL TUNNEL RELEASE    . CERVICAL FUSION    . EYE SURGERY    . ingrown toenail removal    . KNEE ARTHROSCOPY Right   . KNEE CARTILAGE SURGERY    . ROTATOR CUFF REPAIR    . SPINAL FUSION     cervical fusion   Social History   Social History Narrative  . Not on file   Immunization History  Administered Date(s) Administered  . Influenza Split 02/15/2010  . Influenza, High Dose Seasonal PF 02/06/2015, 01/23/2016, 01/20/2017, 02/20/2018  . Influenza, Seasonal, Injecte, Preservative Fre 02/11/2011, 01/04/2013, 04/05/2014  . Influenza,trivalent, recombinat, inj, PF 01/31/2012  . PFIZER SARS-COV-2 Vaccination 07/05/2019, 07/26/2019  . Pneumococcal Conjugate-13 08/11/2014  . Pneumococcal Polysaccharide-23 05/29/2009, 08/29/2015  . Td 07/06/2018  .  Tdap 12/17/2007  . Zoster Recombinat (Shingrix) 02/27/2018, 06/12/2018     Objective: Vital Signs: BP (!) 169/73 (BP Location: Left Arm, Patient Position: Sitting, Cuff Size: Small)   Pulse (!) 53   Resp 12   Ht 5' (1.524 m)   Wt 126 lb (57.2 kg)   BMI 24.61 kg/m    Physical Exam Vitals and nursing note reviewed.  Constitutional:      Appearance: She is  well-developed.  HENT:     Head: Normocephalic and atraumatic.  Eyes:     Conjunctiva/sclera: Conjunctivae normal.  Cardiovascular:     Rate and Rhythm: Normal rate and regular rhythm.     Heart sounds: Normal heart sounds.  Pulmonary:     Effort: Pulmonary effort is normal.     Breath sounds: Normal breath sounds.  Abdominal:     General: Bowel sounds are normal.     Palpations: Abdomen is soft.  Musculoskeletal:     Cervical back: Normal range of motion.  Lymphadenopathy:     Cervical: No cervical adenopathy.  Skin:    General: Skin is warm and dry.     Capillary Refill: Capillary refill takes less than 2 seconds.  Neurological:     Mental Status: She is alert and oriented to person, place, and time.  Psychiatric:        Behavior: Behavior normal.      Musculoskeletal Exam: She had limited range of motion of cervical spine with some discomfort.  She had painful range of motion of her lumbar spine.  She had tenderness on palpation over right SI joint.  Shoulder joints and elbow joints were in good range of motion.  She had good range of motion of her wrist joints.  She has bilateral MCP thickening was noted.  PIP and DIP thickening was noted.  She had swelling in her left fourth PIP joint.  Hip joints, knee joints, ankles with good range of motion.  She has some PIP and DIP thickening with no synovitis.  CDAI Exam: CDAI Score: -- Patient Global: --; Provider Global: -- Swollen: --; Tender: -- Joint Exam 12/14/2019   No joint exam has been documented for this visit   There is currently no information documented on the homunculus. Go to the Rheumatology activity and complete the homunculus joint exam.  Investigation: No additional findings.  Imaging: No results found.  Recent Labs: Lab Results  Component Value Date   WBC 6.3 12/09/2019   HGB 14.0 12/09/2019   PLT 283 12/09/2019   NA 135 12/09/2019   K 4.1 12/09/2019   CL 102 12/09/2019   CO2 24 12/09/2019    GLUCOSE 93 12/09/2019   BUN 20 12/09/2019   CREATININE 0.62 12/09/2019   BILITOT 0.6 12/09/2019   ALKPHOS 53 12/09/2019   AST 24 12/09/2019   ALT 17 12/09/2019   PROT 6.6 12/09/2019   ALBUMIN 3.7 12/09/2019   CALCIUM 9.4 12/09/2019   GFRAA >60 12/09/2019   QFTBGOLDPLUS Negative 10/14/2019    Speciality Comments: [name] Aria infusions every 8 weeks, started on 11/2016 TB gold negative 08/01/16  Procedures:  No procedures performed Allergies: Codeine and Iodine   Assessment / Plan:     Visit Diagnoses: Psoriatic arthropathy (HCC) -she had some swelling in her left fourth PIP joint.  Otherwise synovial thickening with no synovitis was noted.  Plan: XR Hand 2 View Right, XR Hand 2 View Left, XR Foot 2 Views Right, XR Foot 2 Views Left.  X-ray of bilateral hands  showed severe osteoarthritis and erosive changes.  No comparison films were available.  Osteoarthritis and bilateral feet was noted.  I offered cortisone injection to her left fourth finger in the future when needed.  Psoriasis-she has no active psoriasis.  High risk medication use - [name] Aria IV 2 mg/kg every 8 weeks, methotrexate 0.8 ml every 7 days, and folic acid 1 mg 2 tablets daily -her labs are stable.  She will get labs every 3 months to monitor for drug toxicity.  Primary osteoarthritis of both hands -joint protection muscle strengthening was discussed.  Plan: XR Hand 2 View Right, XR Hand 2 View Left  Raynaud's disease without gangrene-currently not active.  Osteopenia of multiple sites - she gets DEXA scan with her PCP.  DDD (degenerative disc disease), cervical - status post fusion  DDD (degenerative disc disease), lumbar-she has severe left lower back pain and discomfort.  History of gastroesophageal reflux (GERD)  History of migraine  COVID-19 vaccine-patient is fully vaccinated against COVID-19.  Use of mask, social distancing and hand hygiene was emphasized.  She is also advised to take COVID-19 booster.   She was advised to postpone methotrexate dose by 1 week after the COVID-19 shot.  She is advised in case she develops COVID-19 infection that she may be eligible for monoclonal antibody infusion.  She should contact our office or PCPs office to get that information.  Orders: Orders Placed This Encounter  Procedures  . XR Hand 2 View Right  . XR Hand 2 View Left  . XR Foot 2 Views Right  . XR Foot 2 Views Left   No orders of the defined types were placed in this encounter.    Follow-Up Instructions: Return in about 5 months (around 05/15/2020) for Psoriatic arthritis.   Pollyann Savoy, MD  Note - This record has been created using Animal nutritionist.  Chart creation errors have been sought, but may not always  have been located. Such creation errors do not reflect on  the standard of medical care.

## 2019-12-02 ENCOUNTER — Ambulatory Visit: Payer: Medicare PPO | Admitting: Rheumatology

## 2019-12-03 ENCOUNTER — Other Ambulatory Visit: Payer: Self-pay | Admitting: Pharmacist

## 2019-12-03 DIAGNOSIS — L4050 Arthropathic psoriasis, unspecified: Secondary | ICD-10-CM

## 2019-12-03 DIAGNOSIS — Z79899 Other long term (current) drug therapy: Secondary | ICD-10-CM

## 2019-12-03 NOTE — Progress Notes (Signed)
Next infusion scheduled for 12/09/19 and due for updated orders.  Last Visit: 07/01/19 Next Visit: 12/14/19 Labs: 10/14/19 WNL TB Gold: 10/16/19 negative  Orders placed for [name] 2mg /kg every 8 weeks x 2 doses along with premedication of Tylenol and Benadryl.  Standing CBC/CMP orders placed.   , PharmD, [place], CPP Clinical Specialty Pharmacist (Rheumatology and Pulmonology)  12/03/2019 12:01 PM

## 2019-12-09 ENCOUNTER — Other Ambulatory Visit: Payer: Self-pay

## 2019-12-09 ENCOUNTER — Other Ambulatory Visit: Payer: Self-pay | Admitting: Rheumatology

## 2019-12-09 ENCOUNTER — Ambulatory Visit (HOSPITAL_COMMUNITY)
Admission: RE | Admit: 2019-12-09 | Discharge: 2019-12-09 | Disposition: A | Discharge status: Home / Self Care | Payer: Medicare PPO | Source: Ambulatory Visit | Attending: Rheumatology | Admitting: Rheumatology

## 2019-12-09 DIAGNOSIS — Z79899 Other long term (current) drug therapy: Secondary | ICD-10-CM | POA: Diagnosis present

## 2019-12-09 DIAGNOSIS — L4050 Arthropathic psoriasis, unspecified: Secondary | ICD-10-CM | POA: Diagnosis not present

## 2019-12-09 LAB — CBC WITH DIFFERENTIAL/PLATELET
Abs Immature Granulocytes: 0.02 10*3/uL (ref 0.00–0.07)
Basophils Absolute: 0.1 10*3/uL (ref 0.0–0.1)
Basophils Relative: 1 %
Eosinophils Absolute: 0.2 10*3/uL (ref 0.0–0.5)
Eosinophils Relative: 3 %
HCT: 42.6 % (ref 36.0–46.0)
Hemoglobin: 14 g/dL (ref 12.0–15.0)
Immature Granulocytes: 0 %
Lymphocytes Relative: 30 %
Lymphs Abs: 1.9 10*3/uL (ref 0.7–4.0)
MCH: 33 pg (ref 26.0–34.0)
MCHC: 32.9 g/dL (ref 30.0–36.0)
MCV: 100.5 fL — ABNORMAL HIGH (ref 80.0–100.0)
Monocytes Absolute: 0.8 10*3/uL (ref 0.1–1.0)
Monocytes Relative: 13 %
Neutro Abs: 3.3 10*3/uL (ref 1.7–7.7)
Neutrophils Relative %: 53 %
Platelets: 283 10*3/uL (ref 150–400)
RBC: 4.24 MIL/uL (ref 3.87–5.11)
RDW: 13.2 % (ref 11.5–15.5)
WBC: 6.3 10*3/uL (ref 4.0–10.5)
nRBC: 0 % (ref 0.0–0.2)

## 2019-12-09 LAB — COMPREHENSIVE METABOLIC PANEL
ALT: 17 U/L (ref 0–44)
AST: 24 U/L (ref 15–41)
Albumin: 3.7 g/dL (ref 3.5–5.0)
Alkaline Phosphatase: 53 U/L (ref 38–126)
Anion gap: 9 (ref 5–15)
BUN: 20 mg/dL (ref 8–23)
CO2: 24 mmol/L (ref 22–32)
Calcium: 9.4 mg/dL (ref 8.9–10.3)
Chloride: 102 mmol/L (ref 98–111)
Creatinine, Ser: 0.62 mg/dL (ref 0.44–1.00)
GFR calc Af Amer: >60 mL/min (ref >60)
GFR calc non Af Amer: >60 mL/min (ref >60)
Glucose, Bld: 93 mg/dL (ref 70–99)
Potassium: 4.1 mmol/L (ref 3.5–5.1)
Sodium: 135 mmol/L (ref 135–145)
Total Bilirubin: 0.6 mg/dL (ref 0.3–1.2)
Total Protein: 6.6 g/dL (ref 6.5–8.1)

## 2019-12-09 MED ORDER — LIDOCAINE 5 % EX PTCH
1.0000 | MEDICATED_PATCH | CUTANEOUS | 0 refills | Status: DC
Start: 2019-12-09 — End: 2020-01-29

## 2019-12-09 MED ORDER — DIPHENHYDRAMINE HCL 25 MG PO CAPS: 25.0000 mg | ORAL_CAPSULE | Freq: Once | ORAL | Status: DC

## 2019-12-09 MED ORDER — ACETAMINOPHEN 325 MG PO TABS: 650.0000 mg | ORAL_TABLET | Freq: Once | ORAL | Status: DC

## 2019-12-09 MED ORDER — SODIUM CHLORIDE 0.9 % IV SOLN
2.0000 mg/kg | INTRAVENOUS | Status: DC
  Administered 2019-12-09: 113.75 mg via INTRAVENOUS
  Filled 2019-12-09: qty 9.1

## 2019-12-09 NOTE — Telephone Encounter (Signed)
Attempted to contact the patient and left message for patient to call the office.  

## 2019-12-09 NOTE — Progress Notes (Signed)
CBC/CMP within normal limits.  Patient should continue current regimen of [name] Aria and methotrexate.

## 2019-12-09 NOTE — Telephone Encounter (Signed)
Patient advised to take Celebrex only occasionally as it can cause elevation in liver function and kidney functions especially when taken in combination with methotrexate.  Patient advise we can reduce the dose of Celebrex 100 mg p.o. daily and give her 30 capsules.

## 2019-12-09 NOTE — Telephone Encounter (Addendum)
Last Visit: 07/01/19 Next Visit: 12/02/19 Labs: 10/14/2019 Labs are normal.  Patient is on MTX 0.8 mL weekly.   Okay to refill Celebrex?

## 2019-12-09 NOTE — Telephone Encounter (Signed)
Please advise patient to take Celebrex only occasionally as it can cause elevation in liver function and kidney functions especially when taken in combination with methotrexate.  We can reduce the dose of Celebrex 100 mg p.o. daily and give her 30 capsules.

## 2019-12-14 ENCOUNTER — Ambulatory Visit (INDEPENDENT_AMBULATORY_CARE_PROVIDER_SITE_OTHER): Payer: Medicare PPO | Admitting: Rheumatology

## 2019-12-14 ENCOUNTER — Other Ambulatory Visit: Payer: Self-pay

## 2019-12-14 ENCOUNTER — Encounter: Payer: Self-pay | Admitting: Rheumatology

## 2019-12-14 ENCOUNTER — Ambulatory Visit: Payer: Self-pay

## 2019-12-14 ENCOUNTER — Ambulatory Visit (INDEPENDENT_AMBULATORY_CARE_PROVIDER_SITE_OTHER): Payer: Medicare PPO

## 2019-12-14 VITALS — BP 169/73 | HR 53 | Resp 12 | Ht 60.0 in | Wt 126.0 lb

## 2019-12-14 DIAGNOSIS — M19042 Primary osteoarthritis, left hand: Secondary | ICD-10-CM

## 2019-12-14 DIAGNOSIS — L4050 Arthropathic psoriasis, unspecified: Secondary | ICD-10-CM | POA: Diagnosis not present

## 2019-12-14 DIAGNOSIS — M19041 Primary osteoarthritis, right hand: Secondary | ICD-10-CM

## 2019-12-14 DIAGNOSIS — Z79899 Other long term (current) drug therapy: Secondary | ICD-10-CM

## 2019-12-14 DIAGNOSIS — I10 Essential (primary) hypertension: Secondary | ICD-10-CM

## 2019-12-14 DIAGNOSIS — M5136 Other intervertebral disc degeneration, lumbar region: Secondary | ICD-10-CM

## 2019-12-14 DIAGNOSIS — L409 Psoriasis, unspecified: Secondary | ICD-10-CM | POA: Diagnosis not present

## 2019-12-14 DIAGNOSIS — Z8669 Personal history of other diseases of the nervous system and sense organs: Secondary | ICD-10-CM

## 2019-12-14 DIAGNOSIS — Z8719 Personal history of other diseases of the digestive system: Secondary | ICD-10-CM

## 2019-12-14 DIAGNOSIS — I7300 Raynaud's syndrome without gangrene: Secondary | ICD-10-CM

## 2019-12-14 DIAGNOSIS — M5030 Other cervical disc degeneration, unspecified cervical region: Secondary | ICD-10-CM

## 2019-12-14 DIAGNOSIS — M8589 Other specified disorders of bone density and structure, multiple sites: Secondary | ICD-10-CM

## 2019-12-14 NOTE — Patient Instructions (Signed)
Standing Labs We placed an order today for your standing lab work.   Please have your standing labs drawn in November and every 3 months   If possible, please have your labs drawn 2 weeks prior to your appointment so that the provider can discuss your results at your appointment.  We have open lab daily Monday through Thursday from 8:30-12:30 PM and 1:30-4:30 PM and Friday from 8:30-12:30 PM and 1:30-4:00 PM at the office of Dr. Michaelangelo Mittelman, [facility].   Please be advised, patients with office appointments requiring lab work will take precedents over walk-in lab work.  If possible, please come for your lab work on Monday and Friday afternoons, as you may experience shorter wait times. The office is located at [address], [address], [place], [place] [address] No appointment is necessary.   Labs are drawn by [facility]. Please bring your co-pay at the time of your lab draw.  You may receive a bill from [facility] for your lab work.  If you wish to have your labs drawn at another location, please call the office 24 hours in advance to send orders.  If you have any questions regarding directions or hours of operation,  please call [phone].   As a reminder, please drink plenty of water prior to coming for your lab work. Thanks!   COVID-19 vaccine recommendations:   COVID-19 vaccine is recommended for everyone (unless you are allergic to a vaccine component), even if you are on a medication that suppresses your immune system.   If you are on Methotrexate, Cellcept (mycophenolate), Rinvoq, Xeljanz, and Olumiant- hold the medication for 1 week after each vaccine. Hold Methotrexate for 2 weeks after the single dose COVID-19 vaccine.   If you are on Orencia subcutaneous injection - hold medication one week prior to and one week after the first COVID-19 vaccine dose (only).   If you are on Orencia IV infusions- time vaccination administration so that the first COVID-19  vaccination will occur four weeks after the infusion and postpone the subsequent infusion by one week.   If you are on Cyclophosphamide or Rituxan infusions please contact your doctor prior to receiving the COVID-19 vaccine.   Do not take Tylenol or any anti-inflammatory medications (NSAIDs) 24 hours prior to the COVID-19 vaccination.   There is no direct evidence about the efficacy of the COVID-19 vaccine in individuals who are on medications that suppress the immune system.   Even if you are fully vaccinated, and you are on any medications that suppress your immune system, please continue to wear a mask, maintain at least six feet social distance and practice hand hygiene.   If you develop a COVID-19 infection, please contact your PCP or our office to determine if you need antibody infusion.  The booster vaccine is now available for immunocompromised patients. It is advised that if you had Pfizer vaccine you should get Pfizer booster.  If you had a Moderna vaccine then you should get a Moderna booster. Johnson and Johnson does not have a booster vaccine at this time.  Please see the following web sites for updated information.   [email or web address]-Patient-[email or web address]  [email or web address]   

## 2019-12-15 ENCOUNTER — Encounter: Payer: Self-pay | Admitting: Rheumatology

## 2019-12-23 ENCOUNTER — Encounter: Payer: Self-pay | Admitting: Cardiology

## 2019-12-23 ENCOUNTER — Other Ambulatory Visit: Payer: Self-pay

## 2019-12-23 ENCOUNTER — Other Ambulatory Visit: Payer: Self-pay | Admitting: Cardiology

## 2019-12-23 ENCOUNTER — Ambulatory Visit: Payer: PRIVATE HEALTH INSURANCE | Admitting: Cardiology

## 2019-12-23 VITALS — BP 160/82 | HR 59 | Resp 17 | Ht 60.0 in | Wt 124.0 lb

## 2019-12-23 DIAGNOSIS — I10 Essential (primary) hypertension: Secondary | ICD-10-CM | POA: Insufficient documentation

## 2019-12-23 DIAGNOSIS — M79605 Pain in left leg: Secondary | ICD-10-CM

## 2019-12-23 DIAGNOSIS — M79604 Pain in right leg: Secondary | ICD-10-CM | POA: Insufficient documentation

## 2019-12-23 MED ORDER — AMLODIPINE BESYLATE 5 MG PO TABS: 5.0000 mg | ORAL_TABLET | Freq: Every day | ORAL | 1 refills | Status: DC

## 2019-12-23 NOTE — Progress Notes (Signed)
Patient referred by Elliot Dally, MD/ Dr. Bo Merino for hypertensiion  Subjective:   Amy Hayes, female    DOB: 26-Sep-1945, 74 y.o.   MRN: [id number]   Chief Complaint  Patient presents with   Hypertension   New Patient (Initial Visit)     HPI  74 y.o. Caucasian female with arthritis, now with elevated blood pressure.   Patient is scheduled to undergo radiofrequency nerve ablation for low back pain, next week.  Dr. Estanislado Pandy was: Concerned about her recent elevated blood pressure.  Patient tells me that her medications for her back pain was switched.  Since then, back pain has increased.  She denies any chest pain, shortness of breath.  She does have bilateral leg pain when she walks.  Pain gets better with rest.  She has not noticed any nonhealing wounds or ulcers.   Past Medical History:  Diagnosis Date   Arthritis    GERD (gastroesophageal reflux disease)    Osteoporosis      Past Surgical History:  Procedure Laterality Date   ABDOMINAL HYSTERECTOMY     CARPAL TUNNEL RELEASE     CERVICAL FUSION     EYE SURGERY     ingrown toenail removal     KNEE ARTHROSCOPY Right    KNEE CARTILAGE SURGERY     ROTATOR CUFF REPAIR     SPINAL FUSION     cervical fusion     Social History   Tobacco Use  Smoking Status Never Smoker  Smokeless Tobacco Never Used    Social History   Substance and Sexual Activity  Alcohol Use No     Family History  Problem Relation Age of Onset   Leukemia Mother    Stroke Father    Hypertension Sister    Hypertension Brother    Stroke Brother      Current Outpatient Medications on File Prior to Visit  Medication Sig Dispense Refill   aspirin 81 MG tablet Take by mouth.     Biotin 1000 MCG tablet Take 5,000 mg by mouth.      butalbital-acetaminophen-caffeine (FIORICET) 50-325-40 MG tablet TAKE 1 OR 2 TABLETS BY MOUTH EVERY 4-6 HOURS AS NEEDED FOR PAIN     Calcium Carbonate-Vitamin D 600-400  MG-UNIT chew tablet Chew by mouth.     cetirizine (ZYRTEC) 10 MG chewable tablet Chew by mouth.     Cyanocobalamin (VITAMIN B-12) 2500 MCG SUBL Place under the tongue.     estrogens, conjugated, (PREMARIN) 0.625 MG tablet TAKE 1 TABLET BY MOUTH EVERY DAY     folic acid (FOLVITE) 1 MG tablet TAKE 2 TABLETS(2 MG) BY MOUTH DAILY 180 tablet 3   golimumab ([name] ARIA) 50 MG/4ML SOLN injection Inject into the vein.     lidocaine (LIDODERM) 5 % Place 1 patch onto the skin daily. Remove & Discard patch within 12 hours or as directed by MD 30 patch 0   methotrexate 50 MG/2ML injection Inject 0.8 mL under the skin once weekly 10 mL 0   mirabegron ER (MYRBETRIQ) 50 MG TB24 tablet TAKE 1 TABLET BY MOUTH EVERY DAY     omeprazole (PRILOSEC) 40 MG capsule Take by mouth.     Polyethyl Glycol-Propyl Glycol 0.4-0.3 % SOLN Apply to eye.     propranolol ER (INDERAL LA) 160 MG SR capsule TAKE ONE CAPSULE BY MOUTH EVERY DAY **NEED TO KEEP SCHEDULED APPT.**     SUMAtriptan (IMITREX) 50 MG tablet Take by mouth as needed.  Vitamin D, Cholecalciferol, 25 MCG (1000 UT) CAPS Take 1 tablet by mouth daily.     TUBERCULIN SYR 1CC/27GX1/2" (B-D TB SYRINGE 1CC/27GX1/2") 27G X 1/2" 1 ML MISC USE 1 SYRINGE ONCE WEEKLY 12 each 3   No current facility-administered medications on file prior to visit.    Cardiovascular and other pertinent studies:  EKG 12/23/2019: Sinus rhythm 67 bpm Normal EKG  Recent labs: 12/09/2019: Glucose 93, BUN/Cr 20/0.62. EGFR >60. Na/K 135/4.1. Rest of the CMP normal H/H 14/42. MCV 100. Platelets 283   Review of Systems  Cardiovascular: Positive for claudication. Negative for chest pain, dyspnea on exertion, leg swelling, palpitations and syncope.  Musculoskeletal: Positive for back pain.         Vitals:   12/23/19 1343  BP: (!) 160/82  Pulse: (!) 59  Resp: 17  SpO2: 97%     Body mass index is 24.22 kg/m. Filed Weights   12/23/19 1343  Weight: 124 lb (56.2 kg)      Objective:   Physical Exam Vitals and nursing note reviewed.  Constitutional:      General: She is not in acute distress. Neck:     Vascular: No JVD.  Cardiovascular:     Rate and Rhythm: Normal rate and regular rhythm.     Pulses: Normal pulses.     Heart sounds: Normal heart sounds. No murmur heard.   Pulmonary:     Effort: Pulmonary effort is normal.     Breath sounds: Normal breath sounds. No wheezing or rales.          Assessment & Recommendations:   74 y.o. Caucasian female with arthritis, now with elevated blood pressure.   Elevated blood pressure without diagnosis of hypertension: I suspect this is related to her back pain.  Recommend annual eye examination and daily timing.  After some time reporting back pain treatment listed, if her blood pressure does improve, she can reduce amlodipine to 2.5 mg daily and eventually stop.  Claudication: Low suspicion for vascular claudication. Neurogenic claudication is a possibility. I will obtain screening ABI. If ABI normal, can stop Aspirin.   I will see her on as needed basis.   Thank you for referring the patient to [place]. Please feel free to contact with any questions.   Nigel Mormon, MD Pager: [phone] Office: [phone]

## 2019-12-28 ENCOUNTER — Ambulatory Visit: Payer: PRIVATE HEALTH INSURANCE

## 2019-12-28 ENCOUNTER — Other Ambulatory Visit: Payer: Self-pay

## 2019-12-28 DIAGNOSIS — M79605 Pain in left leg: Secondary | ICD-10-CM

## 2019-12-28 DIAGNOSIS — M79604 Pain in right leg: Secondary | ICD-10-CM

## 2019-12-31 NOTE — Progress Notes (Signed)
Mild reduction in blood circulation noted in both legs. This could partially be responsible for your leg pain on walking. Continue Aspirin 81 mg. I will be happy to see you for follow up after your treatment for back pain.

## 2019-12-31 NOTE — Progress Notes (Signed)
Left vm to cb.

## 2020-01-04 NOTE — Progress Notes (Signed)
2nd attempt : Called patient on (HOME) phone, NA, LMAM. Called patient on (MOBILE) phone, NA, could not leave a message, VMbox  full.

## 2020-01-05 NOTE — Progress Notes (Signed)
Called and spoke with patient regarding her (ABI) results. Patient understands to continue Aspirin 81mg .

## 2020-01-06 ENCOUNTER — Other Ambulatory Visit: Payer: Self-pay | Admitting: Rheumatology

## 2020-01-06 NOTE — Telephone Encounter (Signed)
Last Visit: 12/14/2019 Next Visit: 05/16/2020 Labs: 12/09/2019 CBC/CMP within normal limits  Okay to refill Celebrex?

## 2020-01-29 ENCOUNTER — Other Ambulatory Visit: Payer: Self-pay | Admitting: Rheumatology

## 2020-01-31 MED ORDER — LIDOCAINE 5 % EX PTCH: 1.0000 | MEDICATED_PATCH | CUTANEOUS | 0 refills | Status: DC

## 2020-01-31 NOTE — Telephone Encounter (Signed)
Last Visit: 12/14/2019 Next Visit: 05/16/2020  Okay to refill per Dr. Corliss Skains

## 2020-02-03 ENCOUNTER — Ambulatory Visit (HOSPITAL_COMMUNITY)
Admission: RE | Admit: 2020-02-03 | Discharge: 2020-02-03 | Disposition: A | Discharge status: Home / Self Care | Payer: Medicare PPO | Source: Ambulatory Visit | Attending: Rheumatology | Admitting: Rheumatology

## 2020-02-03 ENCOUNTER — Other Ambulatory Visit: Payer: Self-pay

## 2020-02-03 DIAGNOSIS — Z79899 Other long term (current) drug therapy: Secondary | ICD-10-CM | POA: Diagnosis present

## 2020-02-03 DIAGNOSIS — L4050 Arthropathic psoriasis, unspecified: Secondary | ICD-10-CM

## 2020-02-03 LAB — CBC WITH DIFFERENTIAL/PLATELET
Abs Immature Granulocytes: 0.02 10*3/uL (ref 0.00–0.07)
Basophils Absolute: 0 10*3/uL (ref 0.0–0.1)
Basophils Relative: 1 %
Eosinophils Absolute: 0.1 10*3/uL (ref 0.0–0.5)
Eosinophils Relative: 2 %
HCT: 41.4 % (ref 36.0–46.0)
Hemoglobin: 13.8 g/dL (ref 12.0–15.0)
Immature Granulocytes: 0 %
Lymphocytes Relative: 30 %
Lymphs Abs: 1.9 10*3/uL (ref 0.7–4.0)
MCH: 32.4 pg (ref 26.0–34.0)
MCHC: 33.3 g/dL (ref 30.0–36.0)
MCV: 97.2 fL (ref 80.0–100.0)
Monocytes Absolute: 0.8 10*3/uL (ref 0.1–1.0)
Monocytes Relative: 13 %
Neutro Abs: 3.6 10*3/uL (ref 1.7–7.7)
Neutrophils Relative %: 54 %
Platelets: 323 10*3/uL (ref 150–400)
RBC: 4.26 MIL/uL (ref 3.87–5.11)
RDW: 12.9 % (ref 11.5–15.5)
WBC: 6.5 10*3/uL (ref 4.0–10.5)
nRBC: 0 % (ref 0.0–0.2)

## 2020-02-03 LAB — COMPREHENSIVE METABOLIC PANEL
ALT: 15 U/L (ref 0–44)
AST: 26 U/L (ref 15–41)
Albumin: 4 g/dL (ref 3.5–5.0)
Alkaline Phosphatase: 62 U/L (ref 38–126)
Anion gap: 11 (ref 5–15)
BUN: 25 mg/dL — ABNORMAL HIGH (ref 8–23)
CO2: 24 mmol/L (ref 22–32)
Calcium: 9.6 mg/dL (ref 8.9–10.3)
Chloride: 101 mmol/L (ref 98–111)
Creatinine, Ser: 0.6 mg/dL (ref 0.44–1.00)
GFR, Estimated: >60 mL/min (ref >60)
Glucose, Bld: 106 mg/dL — ABNORMAL HIGH (ref 70–99)
Potassium: 4.1 mmol/L (ref 3.5–5.1)
Sodium: 136 mmol/L (ref 135–145)
Total Bilirubin: 0.9 mg/dL (ref 0.3–1.2)
Total Protein: 7.2 g/dL (ref 6.5–8.1)

## 2020-02-03 MED ORDER — ACETAMINOPHEN 325 MG PO TABS: 650.0000 mg | ORAL_TABLET | Freq: Once | ORAL | Status: DC

## 2020-02-03 MED ORDER — SODIUM CHLORIDE 0.9 % IV SOLN
2.0000 mg/kg | INTRAVENOUS | Status: DC
  Administered 2020-02-03: 113.75 mg via INTRAVENOUS
  Filled 2020-02-03: qty 9.1

## 2020-02-03 MED ORDER — DIPHENHYDRAMINE HCL 25 MG PO CAPS: 25.0000 mg | ORAL_CAPSULE | Freq: Once | ORAL | Status: DC

## 2020-02-03 NOTE — Progress Notes (Signed)
CMP is normal.

## 2020-02-03 NOTE — Progress Notes (Signed)
CBC is normal.

## 2020-02-11 IMAGING — MR MR OF THE LEFT FEMUR WITHOUT CONTRAST
4 of 6 series · 19 of 40 positions shown · IV contrast (Y)
Comparison: Radiographs 09/30/2018

CLINICAL DATA: Cramping and palpable mass posteriorly in the
proximal left thigh. No acute injury or prior relevant surgery.

EXAM:
MR OF THE LEFT FEMUR WITHOUT CONTRAST
TECHNIQUE: Multiplanar, multisequence MR imaging of the left thigh was
performed. No intravenous contrast was administered.

[Series 4: sag fse ir · sagittal · 4.0mm · 0.78mm/px · 3 of 36 slices shown]
[im 1/36]
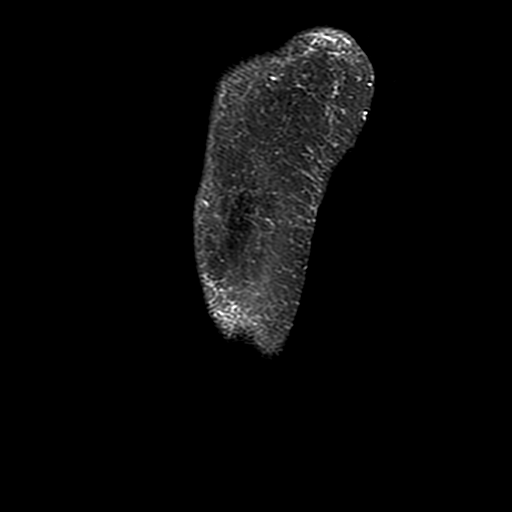
[im 18/36]
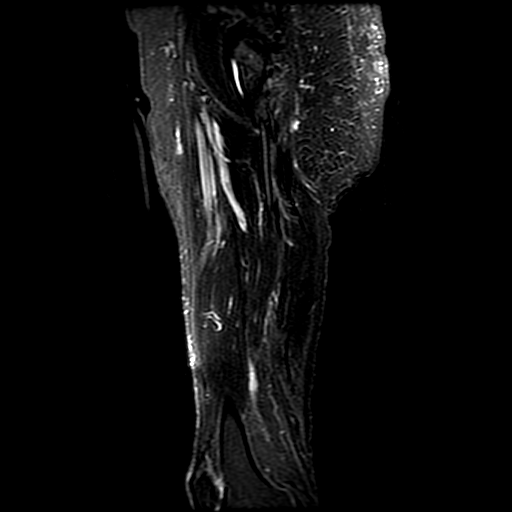
[im 36/36]
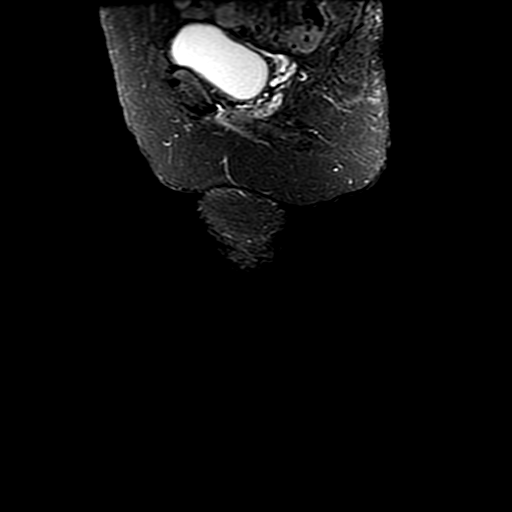

[Series 5: T1 · axial · 5.0mm · 0.47mm/px · z∈[-128,+251]mm · 9 of 60 slices shown (1 of 3)]
[im 1/60]
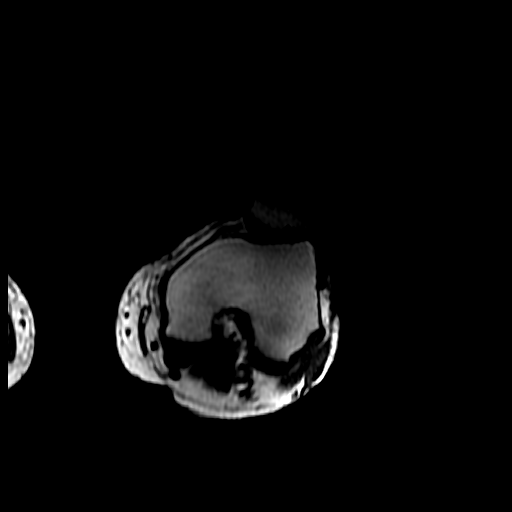
[im 8/60]
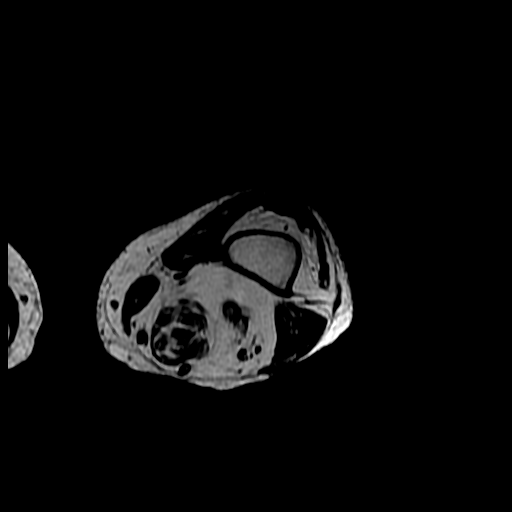
[im 15/60]
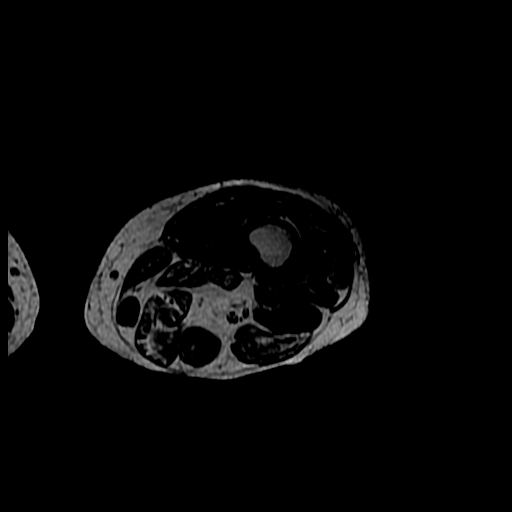
[im 23/60]
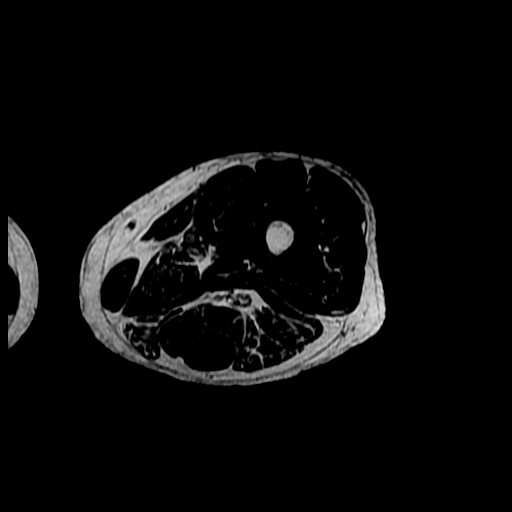
[im 30/60]
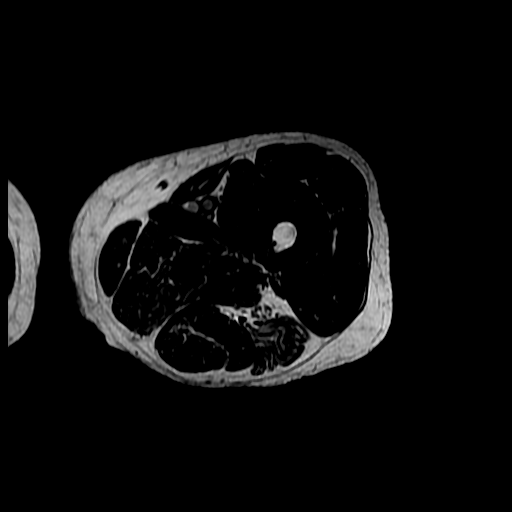
[im 37/60]
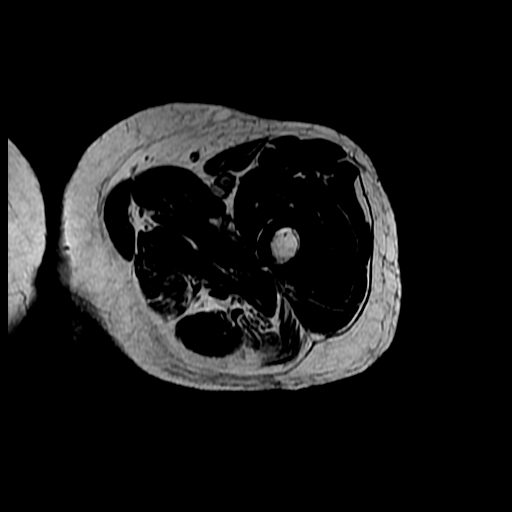
[im 45/60]
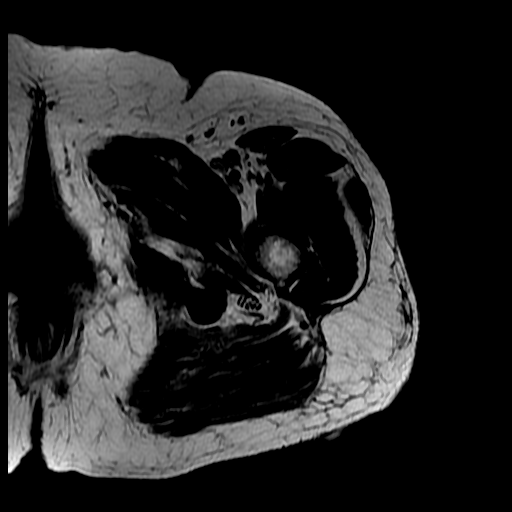
[im 52/60]
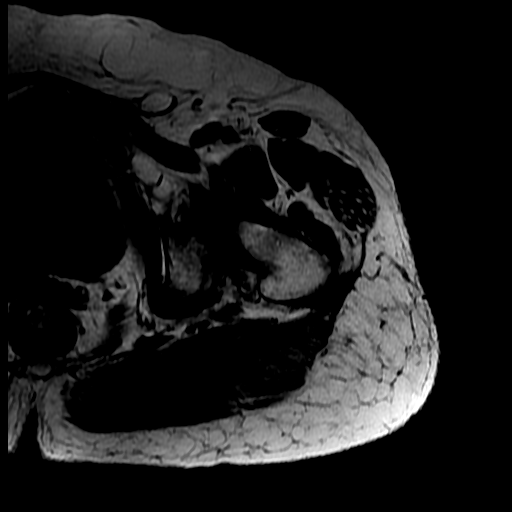
[im 60/60]
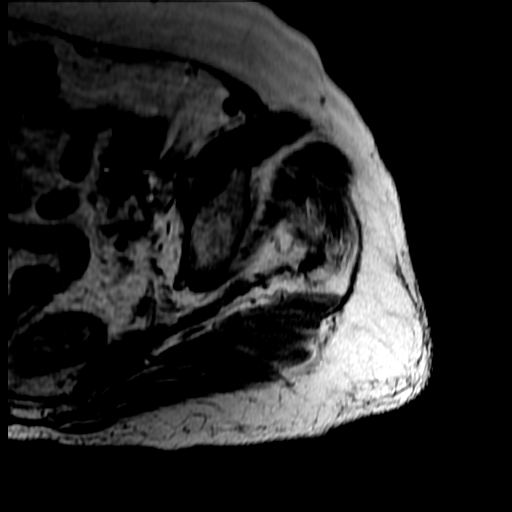

[Series 7: T1 · coronal · 4.0mm · 0.82mm/px · 4 of 40 slices shown (2 of 3)]
[im 1/40]
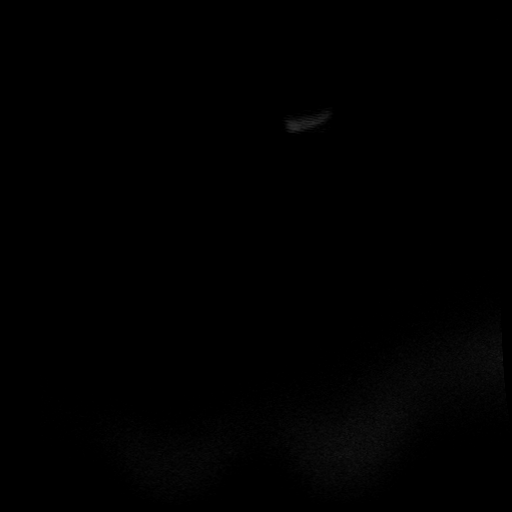
[im 8/40]
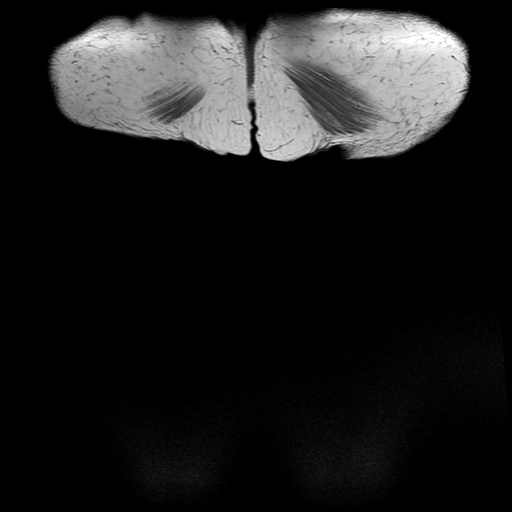
[im 24/40]
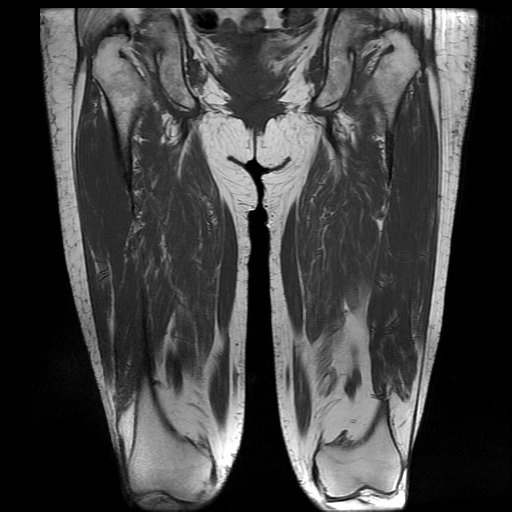
[im 40/40]
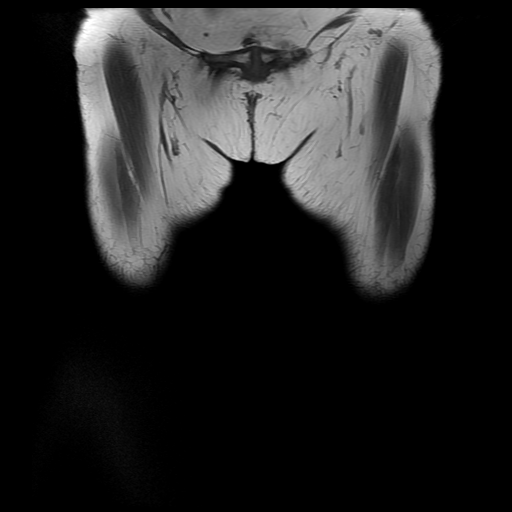

[Series 9: T1 · sagittal · 4.0mm · 0.78mm/px · 3 of 36 slices shown (3 of 3)]
[im 1/36]
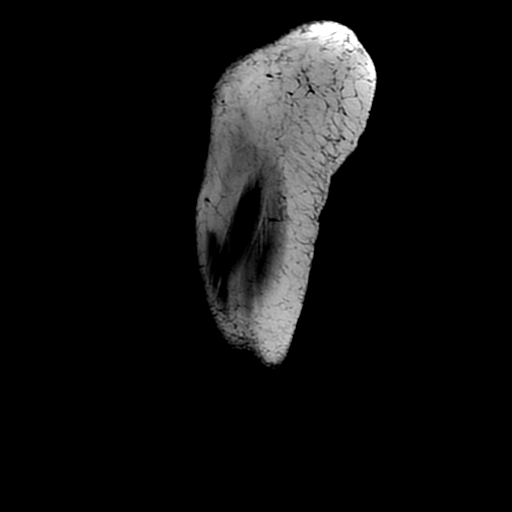
[im 18/36]
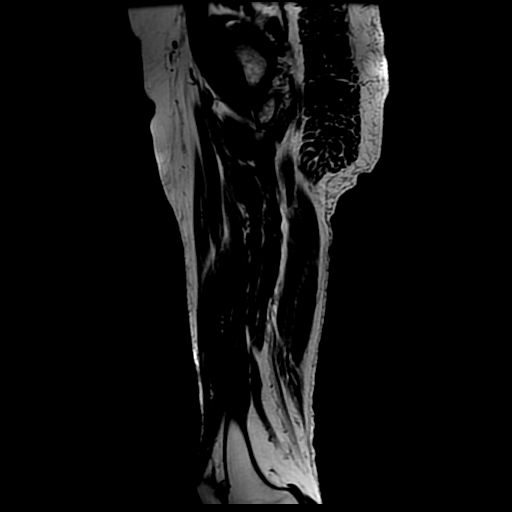
[im 36/36]
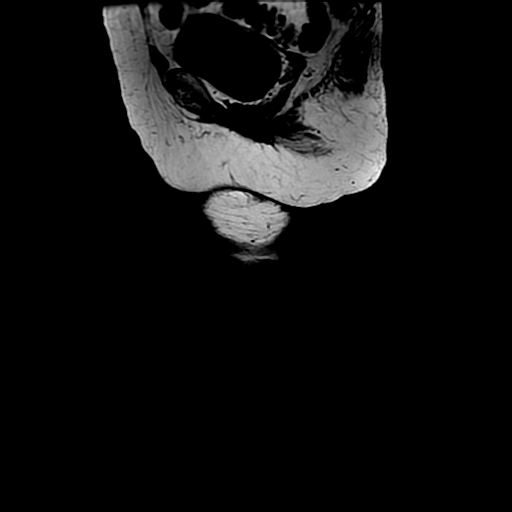

[19 of 40 positions shown; findings below may reference images not displayed]

FINDINGS: Bones/Joint/Cartilage

Both thighs are included on the coronal images. Capsules were placed
over the palpable concern posteriorly in the proximal left thigh.

The left femur appears normal. The visualized right femur also
appears normal. No significant findings are identified at either hip
or knee.

Ligaments

Not relevant for exam/indication.

Muscles and Tendons

There is common hamstring tendinosis and partial tearing
bilaterally, left greater than right. There is no significant tendon
retraction or muscular atrophy. The left thigh musculature appears
normal.

Soft tissues

No soft tissue mass, fluid collection or inflammatory process is
seen posteriorly in the proximal left sided correspond with the
palpable concern. The markers are lateral to the common hamstring
tendons. The underlying subcutaneous fat appears normal. Mild
diverticular changes are present in the sigmoid colon.
IMPRESSION: 1. There is no mass, fluid collection or inflammatory process in the
area of the patient's concern.
2. Bilateral common hamstring tendinosis and partial tearing, left
greater than right. This could contribute to proximal posterior left
thigh pain.
3. No acute osseous findings or significant arthropathic changes.

## 2020-02-17 ENCOUNTER — Other Ambulatory Visit: Payer: Self-pay | Admitting: Rheumatology

## 2020-02-17 MED ORDER — METHOTREXATE SODIUM CHEMO INJECTION 50 MG/2ML
INTRAMUSCULAR | 0 refills | Status: DC
Start: 2020-02-17 — End: 2020-05-16

## 2020-02-17 NOTE — Telephone Encounter (Signed)
Last Visit: 12/14/2019 Next Visit: 05/16/2020 Labs: 02/03/2020 WNL  Current Dose per office note on 12/14/2019: methotrexate 0.8 ml every 7 days Dx: Psoriatic arthropathy   Okay to refill per Dr. Corliss Skains

## 2020-03-16 ENCOUNTER — Encounter: Payer: Self-pay | Admitting: Rheumatology

## 2020-03-20 MED ORDER — "TUBERCULIN SYRINGE 27G X 1/2"" 1 ML MISC": 3 refills | Status: DC

## 2020-03-20 NOTE — Telephone Encounter (Signed)
Last Visit: 12/14/2019 °Next Visit: 05/16/2020 ° °Okay to refill per Dr. Deveshwar  °

## 2020-03-30 LAB — COLOGUARD: COLOGUARD: NEGATIVE

## 2020-03-31 ENCOUNTER — Telehealth: Payer: Self-pay | Admitting: Rheumatology

## 2020-03-31 ENCOUNTER — Encounter: Payer: Self-pay | Admitting: Rheumatology

## 2020-03-31 ENCOUNTER — Other Ambulatory Visit: Payer: Self-pay | Admitting: Pharmacist

## 2020-03-31 DIAGNOSIS — L4050 Arthropathic psoriasis, unspecified: Secondary | ICD-10-CM

## 2020-03-31 DIAGNOSIS — Z79899 Other long term (current) drug therapy: Secondary | ICD-10-CM

## 2020-03-31 NOTE — Telephone Encounter (Signed)
Spouse calling in reference to scheduling patient's December infusion. Patient had last infusion 02/03/2020.  Please send orders over, and call to advise patient when she is ready to call for next appointment.

## 2020-03-31 NOTE — Telephone Encounter (Signed)
Returned call and advised that infusion orders have been placed for [name]. They will call and schedule infusion.  Nothing further needed. Will close encounter.  Chesley Mires, PharmD, MPH Clinical Pharmacist (Rheumatology and Pulmonology)

## 2020-03-31 NOTE — Telephone Encounter (Signed)
I have not received orders to cosign but I see that they have been placed.  Please notify the patient that she can schedule her next infusion.

## 2020-03-31 NOTE — Progress Notes (Unsigned)
Next infusion scheduled for [name] and due for updated orders.  Last Visit: 12/14/19 Next Visit: 05/16/20 Labs: CBC/CMP wnl on 02/03/20 TB Gold: negative on 10/14/19  Orders placed for [name] 2mg /kg every 8 weeks x 2 doses along with premedication of Tylenol and Benadryl to be administered 30 min before infusion.  Standing CBC/CMP orders placed to be monitored with each infusion.  , PharmD, MPH Clinical Pharmacist (Rheumatology and Pulmonology)

## 2020-04-02 ENCOUNTER — Other Ambulatory Visit: Payer: Self-pay | Admitting: Physician Assistant

## 2020-04-03 NOTE — Telephone Encounter (Signed)
Last Visit: 12/14/2019 Next Visit: 05/16/2020 Labs: 02/03/2020 WNL  Okay to refill per Dr. Corliss Skains

## 2020-04-10 ENCOUNTER — Telehealth: Payer: Self-pay

## 2020-04-10 MED ORDER — LIDOCAINE 5 % EX PTCH: 1.0000 | MEDICATED_PATCH | CUTANEOUS | 0 refills | Status: DC

## 2020-04-10 NOTE — Telephone Encounter (Signed)
Patient called requesting prescription refill of Lidocaine 5% to be sent to her NEW PHARMACY - [facility] visitor at [address] in [place], [place].

## 2020-04-10 NOTE — Telephone Encounter (Signed)
Last Visit: 12/14/2019 °Next Visit: 05/16/2020 ° °Okay to refill per Dr. Deveshwar  °

## 2020-04-11 ENCOUNTER — Other Ambulatory Visit: Payer: Self-pay | Admitting: Rheumatology

## 2020-04-17 ENCOUNTER — Ambulatory Visit (HOSPITAL_COMMUNITY)
Admission: RE | Admit: 2020-04-17 | Discharge: 2020-04-17 | Disposition: A | Discharge status: Home / Self Care | Payer: Medicare PPO | Source: Ambulatory Visit | Attending: Rheumatology | Admitting: Rheumatology

## 2020-04-17 ENCOUNTER — Other Ambulatory Visit: Payer: Self-pay

## 2020-04-17 DIAGNOSIS — Z79899 Other long term (current) drug therapy: Secondary | ICD-10-CM

## 2020-04-17 DIAGNOSIS — L4050 Arthropathic psoriasis, unspecified: Secondary | ICD-10-CM

## 2020-04-17 LAB — CBC WITH DIFFERENTIAL/PLATELET
Abs Immature Granulocytes: 0.02 10*3/uL (ref 0.00–0.07)
Basophils Absolute: 0 10*3/uL (ref 0.0–0.1)
Basophils Relative: 1 %
Eosinophils Absolute: 0.1 10*3/uL (ref 0.0–0.5)
Eosinophils Relative: 2 %
HCT: 42.5 % (ref 36.0–46.0)
Hemoglobin: 14.4 g/dL (ref 12.0–15.0)
Immature Granulocytes: 0 %
Lymphocytes Relative: 17 %
Lymphs Abs: 1.1 10*3/uL (ref 0.7–4.0)
MCH: 32.6 pg (ref 26.0–34.0)
MCHC: 33.9 g/dL (ref 30.0–36.0)
MCV: 96.2 fL (ref 80.0–100.0)
Monocytes Absolute: 0.7 10*3/uL (ref 0.1–1.0)
Monocytes Relative: 11 %
Neutro Abs: 4.4 10*3/uL (ref 1.7–7.7)
Neutrophils Relative %: 69 %
Platelets: 385 10*3/uL (ref 150–400)
RBC: 4.42 MIL/uL (ref 3.87–5.11)
RDW: 12.7 % (ref 11.5–15.5)
WBC: 6.3 10*3/uL (ref 4.0–10.5)
nRBC: 0 % (ref 0.0–0.2)

## 2020-04-17 LAB — COMPREHENSIVE METABOLIC PANEL
ALT: 20 U/L (ref 0–44)
AST: 36 U/L (ref 15–41)
Albumin: 3.4 g/dL — ABNORMAL LOW (ref 3.5–5.0)
Alkaline Phosphatase: 71 U/L (ref 38–126)
Anion gap: 9 (ref 5–15)
BUN: 18 mg/dL (ref 8–23)
CO2: 23 mmol/L (ref 22–32)
Calcium: 9 mg/dL (ref 8.9–10.3)
Chloride: 103 mmol/L (ref 98–111)
Creatinine, Ser: 0.53 mg/dL (ref 0.44–1.00)
GFR, Estimated: >60 mL/min (ref >60)
Glucose, Bld: 95 mg/dL (ref 70–99)
Potassium: 4.3 mmol/L (ref 3.5–5.1)
Sodium: 135 mmol/L (ref 135–145)
Total Bilirubin: 0.4 mg/dL (ref 0.3–1.2)
Total Protein: 6.8 g/dL (ref 6.5–8.1)

## 2020-04-17 MED ORDER — DIPHENHYDRAMINE HCL 25 MG PO CAPS: 25.0000 mg | ORAL_CAPSULE | ORAL | Status: DC

## 2020-04-17 MED ORDER — ACETAMINOPHEN 325 MG PO TABS: 650.0000 mg | ORAL_TABLET | ORAL | Status: DC

## 2020-04-17 MED ORDER — SODIUM CHLORIDE 0.9 % IV SOLN
2.0000 mg/kg | INTRAVENOUS | Status: DC
  Administered 2020-04-17: 105 mg via INTRAVENOUS
  Filled 2020-04-17: qty 8.4

## 2020-04-18 NOTE — Progress Notes (Signed)
CBC and CMP are stable.

## 2020-05-02 NOTE — Progress Notes (Signed)
Office Visit Note  Patient: Amy Hayes             Date of Birth: November 22, 1945           MRN: [id number]             PCP: Milda Smart, MD Referring: Milda Smart, MD Visit Date: 05/16/2020 Occupation: @GUAROCC @  Subjective:  Medication management.   History of Present Illness: Amy Hayes is a 75 y.o. female with history of psoriatic arthritis, osteoarthritis and degenerative disc disease.  She states she continues to have some lower back discomfort.  She had intentional weight loss which has been very helpful with the discomfort.  Her knee joint pain has improved.  She denies any joint swelling.  She states that she would like to switch to oral methotrexate instead of subcutaneous methotrexate.  She has been getting [name] Aria infusions without any problems.  Activities of Daily Living:  Patient reports morning stiffness for  0  minutes.   Patient Denies nocturnal pain.  Difficulty dressing/grooming: Denies Difficulty climbing stairs: Reports Difficulty getting out of chair: Denies Difficulty using hands for taps, buttons, cutlery, and/or writing: Reports  Review of Systems  Constitutional: Negative for fatigue.  HENT: Negative for mouth sores, mouth dryness and nose dryness.   Eyes: Negative for pain, itching and dryness.  Respiratory: Negative for shortness of breath and difficulty breathing.   Cardiovascular: Negative for chest pain and palpitations.  Gastrointestinal: Negative for blood in stool, constipation and diarrhea.  Endocrine: Negative for increased urination.  Genitourinary: Negative for difficulty urinating.  Musculoskeletal: Positive for arthralgias, joint pain, joint swelling, myalgias, muscle weakness, muscle tenderness and myalgias. Negative for morning stiffness.  Skin: Positive for color change. Negative for rash and redness.  Allergic/Immunologic: Negative for susceptible to infections.  Neurological: Positive for numbness. Negative for  dizziness, headaches, memory loss and weakness.  Hematological: Positive for bruising/bleeding tendency.  Psychiatric/Behavioral: Negative for confusion.    PMFS History:  Patient Active Problem List   Diagnosis Date Noted  . Essential hypertension 12/23/2019  . Pain in both lower extremities 12/23/2019  . Psoriatic arthropathy (HCC) 12/22/2018  . DDD (degenerative disc disease), lumbar 07/24/2017  . Primary osteoarthritis of both hands 03/04/2017  . DDD (degenerative disc disease), cervical s/p Fusion  10/30/2016  . Raynaud's disease without gangrene 10/30/2016  . History of migraine 10/30/2016  . History of gastroesophageal reflux (GERD) 10/30/2016  . High risk medication use 10/30/2016  . Psoriasis 05/02/2016  . Bilateral low back pain with sciatica 05/02/2016  . Osteopenia 05/02/2016    Past Medical History:  Diagnosis Date  . Arthritis   . GERD (gastroesophageal reflux disease)   . Hyperlipidemia   . Hypertension   . Migraine   . Osteoporosis     Family History  Problem Relation Age of Onset  . Leukemia Mother   . Stroke Father   . Hypertension Sister   . Hypertension Brother   . Stroke Brother    Past Surgical History:  Procedure Laterality Date  . ABDOMINAL HYSTERECTOMY    . CARPAL TUNNEL RELEASE    . CERVICAL FUSION    . EYE SURGERY    . ingrown toenail removal    . KNEE ARTHROSCOPY Right   . KNEE CARTILAGE SURGERY    . ROTATOR CUFF REPAIR    . SPINAL FUSION     cervical fusion   Social History   Social History Narrative  . Not on file  Immunization History  Administered Date(s) Administered  . Influenza Split 02/15/2010  . Influenza, High Dose Seasonal PF 02/06/2015, 01/23/2016, 01/20/2017, 02/20/2018  . Influenza, Seasonal, Injecte, Preservative Fre 02/11/2011, 01/04/2013, 04/05/2014  . Influenza,trivalent, recombinat, inj, PF 01/31/2012  . PFIZER(Purple Top)SARS-COV-2 Vaccination 07/05/2019, 07/26/2019, 12/17/2019  . Pneumococcal  Conjugate-13 08/11/2014  . Pneumococcal Polysaccharide-23 05/29/2009, 08/29/2015  . Td 07/06/2018  . Tdap 12/17/2007  . Zoster Recombinat (Shingrix) 02/27/2018, 06/12/2018     Objective: Vital Signs: BP (!) 146/68 (BP Location: Left Arm, Patient Position: Sitting, Cuff Size: Normal)   Pulse 60   Resp 13   Ht 5' (1.524 m)   Wt 112 lb 12.8 oz (51.2 kg)   BMI 22.03 kg/m    Physical Exam Vitals and nursing note reviewed.  Constitutional:      Appearance: She is well-developed and well-nourished.  HENT:     Head: Normocephalic and atraumatic.  Eyes:     Extraocular Movements: EOM normal.     Conjunctiva/sclera: Conjunctivae normal.  Cardiovascular:     Rate and Rhythm: Normal rate and regular rhythm.     Pulses: Intact distal pulses.     Heart sounds: Normal heart sounds.  Pulmonary:     Effort: Pulmonary effort is normal.     Breath sounds: Normal breath sounds.  Abdominal:     General: Bowel sounds are normal.     Palpations: Abdomen is soft.  Musculoskeletal:     Cervical back: Normal range of motion.  Lymphadenopathy:     Cervical: No cervical adenopathy.  Skin:    General: Skin is warm and dry.     Capillary Refill: Capillary refill takes less than 2 seconds.  Neurological:     Mental Status: She is alert and oriented to person, place, and time.  Psychiatric:        Mood and Affect: Mood and affect normal.        Behavior: Behavior normal.      Musculoskeletal Exam: She has some limitation of her cervical spine without any discomfort.  She had good range of motion of the lumbar spine.  Shoulder joints, elbow joints, wrist joints were in good range of motion.  She has bilateral PIP and DIP thickening and subluxation without any synovitis.  She is some limitation of range of motion of her left hip joint.  Bilateral knee joints with good range of motion.  She had no tenderness over ankles or MTPs.  CDAI Exam: CDAI Score: -- Patient Global: --; Provider Global:  -- Swollen: --; Tender: -- Joint Exam 05/16/2020   No joint exam has been documented for this visit   There is currently no information documented on the homunculus. Go to the Rheumatology activity and complete the homunculus joint exam.  Investigation: No additional findings.  Imaging: No results found.  Recent Labs: Lab Results  Component Value Date   WBC 6.3 04/17/2020   HGB 14.4 04/17/2020   PLT 385 04/17/2020   NA 135 04/17/2020   K 4.3 04/17/2020   CL 103 04/17/2020   CO2 23 04/17/2020   GLUCOSE 95 04/17/2020   BUN 18 04/17/2020   CREATININE 0.53 04/17/2020   BILITOT 0.4 04/17/2020   ALKPHOS 71 04/17/2020   AST 36 04/17/2020   ALT 20 04/17/2020   PROT 6.8 04/17/2020   ALBUMIN 3.4 (L) 04/17/2020   CALCIUM 9.0 04/17/2020   GFRAA >60 12/09/2019   QFTBGOLDPLUS Negative 10/14/2019    Speciality Comments: [name] Aria infusions every 8 weeks, started on  11/2016 TB gold negative 08/01/16  Procedures:  No procedures performed Allergies: Codeine and Iodine   Assessment / Plan:     Visit Diagnoses: Psoriatic arthropathy (HCC)-arthritis appears to be well controlled on [name] Aria infusions and methotrexate subcutaneous injections.  She denies any joint pain or joint swelling.  She wants to switch to oral methotrexate instead of subcutaneous methotrexate.  We will send in prescription for methotrexate 8 tablets p.o. weekly.  She will discontinue subcutaneous methotrexate.  Side effects are reviewed.  Psoriasis-she had no active psoriasis lesions.  High risk medication use - [name] Aria IV 2 mg/kg every 8 weeks, methotrexate 0.8 ml every 7 days, and folic acid 1 mg 2 tablets daily.  Her labs from December 2021 were normal.  She will get labs with her infusions.  TB gold on October 14, 2019 was negative.  She received total 3 doses of COVID-19 vaccine.  Have advised her to get a booster 6 months from her third dose.  She was advised to hold methotrexate for a week after the  booster.  Primary osteoarthritis of both hands-she has DIP and PIP prominence but no synovitis today.  Raynaud's disease without gangrene-currently not active.  Osteopenia of multiple sites - she gets DEXA scan with her PCP. DEXA 09/14/2019  DDD (degenerative disc disease), cervical - status post fusion-she has some limitation range of motion without much discomfort.  DDD (degenerative disc disease), lumbar-she continues to have lower back pain.  She had tight hamstrings.  Have advised her to do hamstring stretches.  History of migraine  History of gastroesophageal reflux (GERD)  Orders: No orders of the defined types were placed in this encounter.  Meds ordered this encounter  Medications  . methotrexate (RHEUMATREX) 2.5 MG tablet    Sig: Take 8 tabs by mouth once weekly. Caution:Chemotherapy. Protect from light.    Dispense:  96 tablet    Refill:  0     Follow-Up Instructions: Return in about 5 months (around 10/14/2020) for Psoriatic arthritis, Osteoarthritis.   Pollyann Savoy, MD  Note - This record has been created using Animal nutritionist.  Chart creation errors have been sought, but may not always  have been located. Such creation errors do not reflect on  the standard of medical care.

## 2020-05-16 ENCOUNTER — Encounter: Payer: Self-pay | Admitting: Rheumatology

## 2020-05-16 ENCOUNTER — Other Ambulatory Visit: Payer: Self-pay

## 2020-05-16 ENCOUNTER — Ambulatory Visit (INDEPENDENT_AMBULATORY_CARE_PROVIDER_SITE_OTHER): Payer: Medicare PPO | Admitting: Rheumatology

## 2020-05-16 VITALS — BP 146/68 | HR 60 | Resp 13 | Ht 60.0 in | Wt 112.8 lb

## 2020-05-16 DIAGNOSIS — Z8719 Personal history of other diseases of the digestive system: Secondary | ICD-10-CM

## 2020-05-16 DIAGNOSIS — I7300 Raynaud's syndrome without gangrene: Secondary | ICD-10-CM

## 2020-05-16 DIAGNOSIS — Z79899 Other long term (current) drug therapy: Secondary | ICD-10-CM

## 2020-05-16 DIAGNOSIS — M5030 Other cervical disc degeneration, unspecified cervical region: Secondary | ICD-10-CM

## 2020-05-16 DIAGNOSIS — M51369 Other intervertebral disc degeneration, lumbar region without mention of lumbar back pain or lower extremity pain: Secondary | ICD-10-CM

## 2020-05-16 DIAGNOSIS — L4050 Arthropathic psoriasis, unspecified: Secondary | ICD-10-CM

## 2020-05-16 DIAGNOSIS — M5136 Other intervertebral disc degeneration, lumbar region: Secondary | ICD-10-CM

## 2020-05-16 DIAGNOSIS — M19042 Primary osteoarthritis, left hand: Secondary | ICD-10-CM

## 2020-05-16 DIAGNOSIS — M19041 Primary osteoarthritis, right hand: Secondary | ICD-10-CM | POA: Diagnosis not present

## 2020-05-16 DIAGNOSIS — L409 Psoriasis, unspecified: Secondary | ICD-10-CM | POA: Diagnosis not present

## 2020-05-16 DIAGNOSIS — Z8669 Personal history of other diseases of the nervous system and sense organs: Secondary | ICD-10-CM

## 2020-05-16 DIAGNOSIS — M8589 Other specified disorders of bone density and structure, multiple sites: Secondary | ICD-10-CM

## 2020-05-16 MED ORDER — METHOTREXATE 2.5 MG PO TABS: ORAL_TABLET | ORAL | 0 refills | Status: DC

## 2020-06-05 ENCOUNTER — Ambulatory Visit (HOSPITAL_COMMUNITY)
Admission: RE | Admit: 2020-06-05 | Discharge: 2020-06-05 | Disposition: A | Discharge status: Home / Self Care | Payer: Medicare PPO | Source: Ambulatory Visit | Attending: Interventional Radiology | Admitting: Interventional Radiology

## 2020-06-05 ENCOUNTER — Other Ambulatory Visit: Payer: Self-pay

## 2020-06-05 DIAGNOSIS — L4050 Arthropathic psoriasis, unspecified: Secondary | ICD-10-CM | POA: Insufficient documentation

## 2020-06-05 DIAGNOSIS — Z79899 Other long term (current) drug therapy: Secondary | ICD-10-CM | POA: Insufficient documentation

## 2020-06-05 LAB — CBC WITH DIFFERENTIAL/PLATELET
Abs Immature Granulocytes: 0.01 10*3/uL (ref 0.00–0.07)
Basophils Absolute: 0 10*3/uL (ref 0.0–0.1)
Basophils Relative: 1 %
Eosinophils Absolute: 0.1 10*3/uL (ref 0.0–0.5)
Eosinophils Relative: 2 %
HCT: 41.8 % (ref 36.0–46.0)
Hemoglobin: 14.1 g/dL (ref 12.0–15.0)
Immature Granulocytes: 0 %
Lymphocytes Relative: 27 %
Lymphs Abs: 1.8 10*3/uL (ref 0.7–4.0)
MCH: 33.4 pg (ref 26.0–34.0)
MCHC: 33.7 g/dL (ref 30.0–36.0)
MCV: 99.1 fL (ref 80.0–100.0)
Monocytes Absolute: 0.8 10*3/uL (ref 0.1–1.0)
Monocytes Relative: 12 %
Neutro Abs: 3.7 10*3/uL (ref 1.7–7.7)
Neutrophils Relative %: 58 %
Platelets: 344 10*3/uL (ref 150–400)
RBC: 4.22 MIL/uL (ref 3.87–5.11)
RDW: 13.3 % (ref 11.5–15.5)
WBC: 6.4 10*3/uL (ref 4.0–10.5)
nRBC: 0 % (ref 0.0–0.2)

## 2020-06-05 LAB — COMPREHENSIVE METABOLIC PANEL
ALT: 17 U/L (ref 0–44)
AST: 24 U/L (ref 15–41)
Albumin: 3.8 g/dL (ref 3.5–5.0)
Alkaline Phosphatase: 55 U/L (ref 38–126)
Anion gap: 10 (ref 5–15)
BUN: 17 mg/dL (ref 8–23)
CO2: 25 mmol/L (ref 22–32)
Calcium: 9.2 mg/dL (ref 8.9–10.3)
Chloride: 102 mmol/L (ref 98–111)
Creatinine, Ser: 0.52 mg/dL (ref 0.44–1.00)
GFR, Estimated: >60 mL/min (ref >60)
Glucose, Bld: 96 mg/dL (ref 70–99)
Potassium: 4 mmol/L (ref 3.5–5.1)
Sodium: 137 mmol/L (ref 135–145)
Total Bilirubin: 0.6 mg/dL (ref 0.3–1.2)
Total Protein: 6.8 g/dL (ref 6.5–8.1)

## 2020-06-05 MED ORDER — ACETAMINOPHEN 325 MG PO TABS: 650.0000 mg | ORAL_TABLET | ORAL | Status: AC

## 2020-06-05 MED ORDER — SODIUM CHLORIDE 0.9 % IV SOLN
2.0000 mg/kg | INTRAVENOUS | Status: AC
  Administered 2020-06-05: 105 mg via INTRAVENOUS
  Filled 2020-06-05: qty 8.4

## 2020-06-05 MED ORDER — DIPHENHYDRAMINE HCL 25 MG PO CAPS: 25.0000 mg | ORAL_CAPSULE | ORAL | Status: DC

## 2020-06-05 MED ORDER — ACETAMINOPHEN 325 MG PO TABS
ORAL_TABLET | ORAL | Status: AC
  Administered 2020-06-05: 650 mg via ORAL
  Filled 2020-06-05: qty 2

## 2020-06-05 NOTE — Progress Notes (Signed)
CMP is within normal limits.

## 2020-06-17 ENCOUNTER — Other Ambulatory Visit: Payer: Self-pay | Admitting: Cardiology

## 2020-06-17 DIAGNOSIS — I10 Essential (primary) hypertension: Secondary | ICD-10-CM

## 2020-06-29 ENCOUNTER — Telehealth: Payer: Self-pay | Admitting: *Deleted

## 2020-06-29 ENCOUNTER — Other Ambulatory Visit: Payer: Self-pay | Admitting: Rheumatology

## 2020-06-29 MED ORDER — LIDOCAINE 5 % EX PTCH: 1.0000 | MEDICATED_PATCH | CUTANEOUS | 0 refills | Status: DC

## 2020-06-29 NOTE — Telephone Encounter (Signed)
Next Visit: 10/10/2020  Last Visit: 05/16/2020  Last Fill:04/10/2020  Dx: Psoriatic arthropathy  Current Dose per office note on 05/16/2020, not mentioned  Okay to refill Lidoderm?

## 2020-06-29 NOTE — Telephone Encounter (Signed)
Received notification from [facility] regarding a prior authorization for Lidocaine 5% Patch. Authorization has been APPROVED from 06/30/2018 to 04/21/2021.

## 2020-07-07 ENCOUNTER — Encounter: Payer: Self-pay | Admitting: Rheumatology

## 2020-07-31 ENCOUNTER — Other Ambulatory Visit: Payer: Self-pay | Admitting: Pharmacist

## 2020-07-31 ENCOUNTER — Encounter (HOSPITAL_COMMUNITY): Payer: Medicare PPO

## 2020-07-31 DIAGNOSIS — L409 Psoriasis, unspecified: Secondary | ICD-10-CM

## 2020-07-31 DIAGNOSIS — L4050 Arthropathic psoriasis, unspecified: Secondary | ICD-10-CM

## 2020-07-31 DIAGNOSIS — Z79899 Other long term (current) drug therapy: Secondary | ICD-10-CM

## 2020-07-31 NOTE — Progress Notes (Signed)
Next infusion scheduled for [name] on 08/01/20 and due for updated orders  Diagnosis: psoriatic arthropathy, psoriasis  Dose: [name] 2mg /kg every 8 weeks x 2 doses  Last Clinic Visit: 05/16/20 Next Clinic Visit: 10/10/20  Last infusion: 06/05/20 Labs: CMP and CBC wnl on 06/05/20 TB Gold: negative on 10/14/19  Orders placed for [name] 2mg /kg x 2 doses along with premedication of Tylenol and Benadryl to be administered 30 minutes before medication infusion.   Standing CBC with diff/platelet and CMP with GFR orders placed to be drawn every 2 months.  Next TB gold due June 2022  Called patient and provided with phone number for [facility] [phone])  [facility], PharmD, MPH Clinical Pharmacist (Rheumatology and Pulmonology)

## 2020-08-01 ENCOUNTER — Ambulatory Visit (HOSPITAL_COMMUNITY)
Admission: RE | Admit: 2020-08-01 | Discharge: 2020-08-01 | Disposition: A | Discharge status: Home / Self Care | Payer: Medicare PPO | Source: Ambulatory Visit | Attending: Rheumatology | Admitting: Rheumatology

## 2020-08-01 ENCOUNTER — Other Ambulatory Visit: Payer: Self-pay

## 2020-08-01 DIAGNOSIS — L409 Psoriasis, unspecified: Secondary | ICD-10-CM

## 2020-08-01 DIAGNOSIS — Z79899 Other long term (current) drug therapy: Secondary | ICD-10-CM

## 2020-08-01 DIAGNOSIS — L4050 Arthropathic psoriasis, unspecified: Secondary | ICD-10-CM | POA: Insufficient documentation

## 2020-08-01 LAB — COMPREHENSIVE METABOLIC PANEL
ALT: 15 U/L (ref 0–44)
AST: 24 U/L (ref 15–41)
Albumin: 3.6 g/dL (ref 3.5–5.0)
Alkaline Phosphatase: 50 U/L (ref 38–126)
Anion gap: 6 (ref 5–15)
BUN: 14 mg/dL (ref 8–23)
CO2: 26 mmol/L (ref 22–32)
Calcium: 9.1 mg/dL (ref 8.9–10.3)
Chloride: 103 mmol/L (ref 98–111)
Creatinine, Ser: 0.63 mg/dL (ref 0.44–1.00)
GFR, Estimated: >60 mL/min (ref >60)
Glucose, Bld: 90 mg/dL (ref 70–99)
Potassium: 4 mmol/L (ref 3.5–5.1)
Sodium: 135 mmol/L (ref 135–145)
Total Bilirubin: 0.9 mg/dL (ref 0.3–1.2)
Total Protein: 6.3 g/dL — ABNORMAL LOW (ref 6.5–8.1)

## 2020-08-01 LAB — CBC WITH DIFFERENTIAL/PLATELET
Abs Immature Granulocytes: 0.03 10*3/uL (ref 0.00–0.07)
Basophils Absolute: 0 10*3/uL (ref 0.0–0.1)
Basophils Relative: 1 %
Eosinophils Absolute: 0.1 10*3/uL (ref 0.0–0.5)
Eosinophils Relative: 2 %
HCT: 42.8 % (ref 36.0–46.0)
Hemoglobin: 14.3 g/dL (ref 12.0–15.0)
Immature Granulocytes: 0 %
Lymphocytes Relative: 30 %
Lymphs Abs: 2.1 10*3/uL (ref 0.7–4.0)
MCH: 33.1 pg (ref 26.0–34.0)
MCHC: 33.4 g/dL (ref 30.0–36.0)
MCV: 99.1 fL (ref 80.0–100.0)
Monocytes Absolute: 0.8 10*3/uL (ref 0.1–1.0)
Monocytes Relative: 11 %
Neutro Abs: 4.1 10*3/uL (ref 1.7–7.7)
Neutrophils Relative %: 56 %
Platelets: 340 10*3/uL (ref 150–400)
RBC: 4.32 MIL/uL (ref 3.87–5.11)
RDW: 12.8 % (ref 11.5–15.5)
WBC: 7.2 10*3/uL (ref 4.0–10.5)
nRBC: 0 % (ref 0.0–0.2)

## 2020-08-01 MED ORDER — DIPHENHYDRAMINE HCL 25 MG PO CAPS
25.0000 mg | ORAL_CAPSULE | ORAL | Status: DC
Start: 2020-08-01 — End: 2020-08-02

## 2020-08-01 MED ORDER — ACETAMINOPHEN 325 MG PO TABS: 650.0000 mg | ORAL_TABLET | ORAL | Status: DC

## 2020-08-01 MED ORDER — SODIUM CHLORIDE 0.9 % IV SOLN
2.0000 mg/kg | INTRAVENOUS | Status: DC
  Administered 2020-08-01: 98.75 mg via INTRAVENOUS
  Filled 2020-08-01: qty 7.9

## 2020-08-01 NOTE — Progress Notes (Signed)
Total protein borderline low.  Rest of CMP WNL.  We will continue to monitor.

## 2020-08-01 NOTE — Progress Notes (Signed)
CBC WNL

## 2020-08-10 ENCOUNTER — Other Ambulatory Visit: Payer: Self-pay | Admitting: Rheumatology

## 2020-08-10 MED ORDER — METHOTREXATE 2.5 MG PO TABS: ORAL_TABLET | ORAL | 0 refills | Status: DC

## 2020-08-10 NOTE — Telephone Encounter (Signed)
Next Visit: 10/10/2020  Last Visit: 05/16/2020  Last Fill: 05/16/2020  DX: Psoriatic arthropathy   Current Dose per office note on 05/16/2020: methotrexate 8 tablets p.o. weekly.  Labs: 08/01/2020 Total protein borderline low. Rest of CMP WNL. We will continue to monitor.   Okay to refill methotrexate?

## 2020-08-22 ENCOUNTER — Other Ambulatory Visit: Payer: Self-pay

## 2020-08-22 MED ORDER — LIDOCAINE 5 % EX PTCH: 1.0000 | MEDICATED_PATCH | CUTANEOUS | 2 refills | Status: DC

## 2020-08-22 NOTE — Telephone Encounter (Signed)
Next Visit: 10/10/2020  Last Visit: 05/16/2020  Last Fill:06/29/2020  Dx:  Psoriatic arthropathy   Current Dose per office note on 05/16/2020, not mentioned.  Okay to refill Lidoderm?

## 2020-09-21 ENCOUNTER — Encounter: Payer: Self-pay | Admitting: Rheumatology

## 2020-09-21 MED ORDER — FOLIC ACID 1 MG PO TABS: ORAL_TABLET | ORAL | 3 refills | Status: DC

## 2020-09-21 NOTE — Telephone Encounter (Signed)
Next Visit: 10/10/2020  Last Visit: 05/16/2020  Current Dose per office note on 05/16/2020: folic acid 1 mg 2 tablets daily.  Last Fill: 08/24/2019  Okay to refill per Dr. Corliss Skains

## 2020-09-26 ENCOUNTER — Other Ambulatory Visit: Payer: Self-pay

## 2020-09-26 ENCOUNTER — Ambulatory Visit (HOSPITAL_COMMUNITY)
Admission: RE | Admit: 2020-09-26 | Discharge: 2020-09-26 | Disposition: A | Discharge status: Home / Self Care | Payer: Medicare PPO | Source: Ambulatory Visit | Attending: Rheumatology | Admitting: Rheumatology

## 2020-09-26 DIAGNOSIS — L4050 Arthropathic psoriasis, unspecified: Secondary | ICD-10-CM | POA: Diagnosis present

## 2020-09-26 DIAGNOSIS — L409 Psoriasis, unspecified: Secondary | ICD-10-CM | POA: Insufficient documentation

## 2020-09-26 DIAGNOSIS — Z79899 Other long term (current) drug therapy: Secondary | ICD-10-CM

## 2020-09-26 LAB — CBC WITH DIFFERENTIAL/PLATELET
Abs Immature Granulocytes: 0.02 10*3/uL (ref 0.00–0.07)
Basophils Absolute: 0.1 10*3/uL (ref 0.0–0.1)
Basophils Relative: 1 %
Eosinophils Absolute: 0.1 10*3/uL (ref 0.0–0.5)
Eosinophils Relative: 2 %
HCT: 41.4 % (ref 36.0–46.0)
Hemoglobin: 14 g/dL (ref 12.0–15.0)
Immature Granulocytes: 0 %
Lymphocytes Relative: 32 %
Lymphs Abs: 2 10*3/uL (ref 0.7–4.0)
MCH: 34 pg (ref 26.0–34.0)
MCHC: 33.8 g/dL (ref 30.0–36.0)
MCV: 100.5 fL — ABNORMAL HIGH (ref 80.0–100.0)
Monocytes Absolute: 0.8 10*3/uL (ref 0.1–1.0)
Monocytes Relative: 12 %
Neutro Abs: 3.3 10*3/uL (ref 1.7–7.7)
Neutrophils Relative %: 53 %
Platelets: 349 10*3/uL (ref 150–400)
RBC: 4.12 MIL/uL (ref 3.87–5.11)
RDW: 13.1 % (ref 11.5–15.5)
WBC: 6.2 10*3/uL (ref 4.0–10.5)
nRBC: 0 % (ref 0.0–0.2)

## 2020-09-26 LAB — COMPREHENSIVE METABOLIC PANEL
ALT: 18 U/L (ref 0–44)
AST: 26 U/L (ref 15–41)
Albumin: 3.6 g/dL (ref 3.5–5.0)
Alkaline Phosphatase: 54 U/L (ref 38–126)
Anion gap: 9 (ref 5–15)
BUN: 19 mg/dL (ref 8–23)
CO2: 25 mmol/L (ref 22–32)
Calcium: 9.3 mg/dL (ref 8.9–10.3)
Chloride: 101 mmol/L (ref 98–111)
Creatinine, Ser: 0.51 mg/dL (ref 0.44–1.00)
GFR, Estimated: >60 mL/min (ref >60)
Glucose, Bld: 90 mg/dL (ref 70–99)
Potassium: 4.1 mmol/L (ref 3.5–5.1)
Sodium: 135 mmol/L (ref 135–145)
Total Bilirubin: 0.8 mg/dL (ref 0.3–1.2)
Total Protein: 6.5 g/dL (ref 6.5–8.1)

## 2020-09-26 MED ORDER — SODIUM CHLORIDE 0.9 % IV SOLN
2.0000 mg/kg | INTRAVENOUS | Status: AC
  Administered 2020-09-26: 98.75 mg via INTRAVENOUS
  Filled 2020-09-26: qty 7.9

## 2020-09-26 MED ORDER — ACETAMINOPHEN 325 MG PO TABS
650.0000 mg | ORAL_TABLET | ORAL | Status: DC
Start: 2020-09-26 — End: 2020-09-27

## 2020-09-26 MED ORDER — DIPHENHYDRAMINE HCL 25 MG PO CAPS
25.0000 mg | ORAL_CAPSULE | ORAL | Status: DC
Start: 2020-09-26 — End: 2020-09-27

## 2020-09-28 LAB — QUANTIFERON-TB GOLD PLUS (RQFGPL)
QuantiFERON Mitogen Value: >10 IU/mL
QuantiFERON Nil Value: 0.05 IU/mL
QuantiFERON TB1 Ag Value: 0.06 IU/mL
QuantiFERON TB2 Ag Value: 0.05 IU/mL

## 2020-09-28 LAB — QUANTIFERON-TB GOLD PLUS: QuantiFERON-TB Gold Plus: NEGATIVE

## 2020-09-29 NOTE — Progress Notes (Signed)
Office Visit Note  Patient: Amy Hayes             Date of Birth: 05-27-1945           MRN: [id number]             PCP: Milda Smart, MD Referring: Milda Smart, MD Visit Date: 10/10/2020 Occupation: @GUAROCC @  Subjective:  Other (Patient reports back pain and is currently scheduled for an ablation on July 7th, 2022.)   History of Present Illness: Amy Hayes is a 75 y.o. female with a history of psoriatic arthritis, psoriasis, osteoarthritis and degenerative disc disease.  She denies any inflammation in her joints currently.  She denies any psoriasis lesions.  She has been tolerating [name] Aria infusions along with methotrexate without any side effects.  She continues to have some lower back pain.  She is scheduled to have ablation on July 7.  Activities of Daily Living:  Patient reports morning stiffness for all day. Patient Denies nocturnal pain.  Difficulty dressing/grooming: Denies Difficulty climbing stairs: Denies Difficulty getting out of chair: Denies Difficulty using hands for taps, buttons, cutlery, and/or writing: Reports  Review of Systems  Constitutional:  Positive for fatigue.  HENT:  Positive for mouth dryness and nose dryness. Negative for mouth sores.   Eyes:  Positive for dryness. Negative for pain and itching.  Respiratory:  Negative for shortness of breath and difficulty breathing.   Cardiovascular:  Negative for chest pain and palpitations.  Gastrointestinal:  Negative for blood in stool, constipation and diarrhea.  Endocrine: Negative for increased urination.  Genitourinary:  Negative for difficulty urinating.  Musculoskeletal:  Positive for joint pain, joint pain and morning stiffness. Negative for joint swelling, myalgias, muscle tenderness and myalgias.  Skin:  Positive for color change. Negative for rash and redness.  Allergic/Immunologic: Negative for susceptible to infections.  Neurological:  Positive for numbness and weakness.  Negative for dizziness, headaches and memory loss.  Hematological:  Positive for bruising/bleeding tendency.  Psychiatric/Behavioral:  Negative for confusion.    PMFS History:  Patient Active Problem List   Diagnosis Date Noted   Essential hypertension 12/23/2019   Pain in both lower extremities 12/23/2019   Psoriatic arthropathy (HCC) 12/22/2018   DDD (degenerative disc disease), lumbar 07/24/2017   Primary osteoarthritis of both hands 03/04/2017   DDD (degenerative disc disease), cervical s/p Fusion  10/30/2016   Raynaud's disease without gangrene 10/30/2016   History of migraine 10/30/2016   History of gastroesophageal reflux (GERD) 10/30/2016   High risk medication use 10/30/2016   Psoriasis 05/02/2016   Bilateral low back pain with sciatica 05/02/2016   Osteopenia 05/02/2016    Past Medical History:  Diagnosis Date   Arthritis    GERD (gastroesophageal reflux disease)    Hyperlipidemia    Hypertension    Migraine    Osteoporosis     Family History  Problem Relation Age of Onset   Leukemia Mother    Stroke Father    Hypertension Sister    Hypertension Brother    Stroke Brother    Past Surgical History:  Procedure Laterality Date   ABDOMINAL HYSTERECTOMY     CARPAL TUNNEL RELEASE     CERVICAL FUSION     EYE SURGERY     ingrown toenail removal     KNEE ARTHROSCOPY Right    KNEE CARTILAGE SURGERY     ROTATOR CUFF REPAIR     SPINAL FUSION     cervical fusion  Social History   Social History Narrative   Not on file   Immunization History  Administered Date(s) Administered   Influenza Split 02/15/2010   Influenza, High Dose Seasonal PF 02/06/2015, 01/23/2016, 01/20/2017, 02/20/2018   Influenza, Seasonal, Injecte, Preservative Fre 02/11/2011, 01/04/2013, 04/05/2014   Influenza,trivalent, recombinat, inj, PF 01/31/2012   PFIZER(Purple Top)SARS-COV-2 Vaccination 07/05/2019, 07/26/2019, 12/17/2019, 07/04/2020   Pneumococcal Conjugate-13 08/11/2014    Pneumococcal Polysaccharide-23 05/29/2009, 08/29/2015   Td 07/06/2018   Tdap 12/17/2007   Zoster Recombinat (Shingrix) 02/27/2018, 06/12/2018     Objective: Vital Signs: BP 118/69 (BP Location: Left Arm, Patient Position: Sitting, Cuff Size: Normal)   Pulse 61   Ht 5' (1.524 m)   Wt 102 lb 6.4 oz (46.4 kg)   BMI 20.00 kg/m    Physical Exam Vitals and nursing note reviewed.  Constitutional:      Appearance: She is well-developed.  HENT:     Head: Normocephalic and atraumatic.  Eyes:     Conjunctiva/sclera: Conjunctivae normal.  Cardiovascular:     Rate and Rhythm: Normal rate and regular rhythm.     Heart sounds: Normal heart sounds.  Pulmonary:     Effort: Pulmonary effort is normal.     Breath sounds: Normal breath sounds.  Abdominal:     General: Bowel sounds are normal.     Palpations: Abdomen is soft.  Musculoskeletal:     Cervical back: Normal range of motion.  Lymphadenopathy:     Cervical: No cervical adenopathy.  Skin:    General: Skin is warm and dry.     Capillary Refill: Capillary refill takes less than 2 seconds.     Comments: Nail dystrophy was noted in right fourth finger.  Neurological:     Mental Status: She is alert and oriented to person, place, and time.  Psychiatric:        Behavior: Behavior normal.     Musculoskeletal Exam: Some limitation with range of motion of her cervical spine noted.  She had discomfort range of motion of her lumbar spine.  Shoulder joints, elbow joints, wrist joints, MCPs were in good range of motion with no synovitis.  She had bilateral PIP and DIP thickening.  Hip joints and knee joints with good range of motion.  She had no tenderness over ankles or MTPs.  CDAI Exam: CDAI Score: -- Patient Global: --; Provider Global: -- Swollen: --; Tender: -- Joint Exam 10/10/2020   No joint exam has been documented for this visit   There is currently no information documented on the homunculus. Go to the Rheumatology activity  and complete the homunculus joint exam.  Investigation: No additional findings.  Imaging: No results found.  Recent Labs: Lab Results  Component Value Date   WBC 6.2 09/26/2020   HGB 14.0 09/26/2020   PLT 349 09/26/2020   NA 135 09/26/2020   K 4.1 09/26/2020   CL 101 09/26/2020   CO2 25 09/26/2020   GLUCOSE 90 09/26/2020   BUN 19 09/26/2020   CREATININE 0.51 09/26/2020   BILITOT 0.8 09/26/2020   ALKPHOS 54 09/26/2020   AST 26 09/26/2020   ALT 18 09/26/2020   PROT 6.5 09/26/2020   ALBUMIN 3.6 09/26/2020   CALCIUM 9.3 09/26/2020   GFRAA >60 12/09/2019   QFTBGOLDPLUS Negative 09/26/2020    Speciality Comments: [name] Aria infusions every 8 weeks, started on 11/2016 TB gold negative 08/01/16  Procedures:  No procedures performed Allergies: Codeine and Iodine   Assessment / Plan:  Visit Diagnoses: Psoriatic arthropathy (HCC)-no synovitis was noted on the examination today.  She has been tolerating [name] Aria infusions and methotrexate combination well.  Psoriasis-she denies any active psoriasis lesions.  High risk medication use - [name] Aria IV 2 mg/kg every 8 weeks, methotrexate 8 tablets by mouth every 7 days, and folic acid 1 mg 2 tablets daily.  Labs from June 2022 were reviewed which were all within normal limits.  TB gold was negative on September 26, 2020.  She has been advised to stop [name] Aria infusions and methotrexate in case she develops any infection.  She may resume the medications once the infection resolves.  She is fully vaccinated against COVID-19 and also received a booster.  Updated information regarding the vaccination was placed in the AVS.  Primary osteoarthritis of both hands-she had no synovitis on the examination.  Raynaud's disease without gangrene-currently not symptomatic.  Osteopenia of multiple sites - she gets DEXA scan with her PCP. DEXA 09/14/2019  DDD (degenerative disc disease), cervical-s/p fusion she has some limitation with range  of motion without much discomfort.  DDD (degenerative disc disease), lumbar-she has chronic pain and discomfort.  She has been getting cortisone injections.  She is scheduled to have radiofrequency ablation in July.  History of gastroesophageal reflux (GERD)  History of migraine  Orders: No orders of the defined types were placed in this encounter.  No orders of the defined types were placed in this encounter.   Follow-Up Instructions: No follow-ups on file.   Pollyann Savoy, MD  Note - This record has been created using Animal nutritionist.  Chart creation errors have been sought, but may not always  have been located. Such creation errors do not reflect on  the standard of medical care.

## 2020-10-10 ENCOUNTER — Encounter: Payer: Self-pay | Admitting: Rheumatology

## 2020-10-10 ENCOUNTER — Other Ambulatory Visit: Payer: Self-pay

## 2020-10-10 ENCOUNTER — Ambulatory Visit (INDEPENDENT_AMBULATORY_CARE_PROVIDER_SITE_OTHER): Payer: Medicare PPO | Admitting: Rheumatology

## 2020-10-10 VITALS — BP 118/69 | HR 61 | Ht 60.0 in | Wt 102.4 lb

## 2020-10-10 DIAGNOSIS — Z8669 Personal history of other diseases of the nervous system and sense organs: Secondary | ICD-10-CM

## 2020-10-10 DIAGNOSIS — L409 Psoriasis, unspecified: Secondary | ICD-10-CM | POA: Diagnosis not present

## 2020-10-10 DIAGNOSIS — Z79899 Other long term (current) drug therapy: Secondary | ICD-10-CM | POA: Diagnosis not present

## 2020-10-10 DIAGNOSIS — I7300 Raynaud's syndrome without gangrene: Secondary | ICD-10-CM

## 2020-10-10 DIAGNOSIS — M5136 Other intervertebral disc degeneration, lumbar region: Secondary | ICD-10-CM

## 2020-10-10 DIAGNOSIS — M19042 Primary osteoarthritis, left hand: Secondary | ICD-10-CM

## 2020-10-10 DIAGNOSIS — M19041 Primary osteoarthritis, right hand: Secondary | ICD-10-CM | POA: Diagnosis not present

## 2020-10-10 DIAGNOSIS — L4050 Arthropathic psoriasis, unspecified: Secondary | ICD-10-CM

## 2020-10-10 DIAGNOSIS — Z8719 Personal history of other diseases of the digestive system: Secondary | ICD-10-CM

## 2020-10-10 DIAGNOSIS — M5030 Other cervical disc degeneration, unspecified cervical region: Secondary | ICD-10-CM

## 2020-10-10 DIAGNOSIS — M8589 Other specified disorders of bone density and structure, multiple sites: Secondary | ICD-10-CM

## 2020-10-10 MED ORDER — LIDOCAINE 5 % EX PTCH: 2.0000 | MEDICATED_PATCH | CUTANEOUS | 4 refills | Status: DC

## 2020-10-10 NOTE — Patient Instructions (Signed)
Vaccines You are taking a medication(s) that can suppress your immune system.  The following immunizations are recommended: Flu annually Covid-19  Td/Tdap (tetanus, diphtheria, pertussis) every 10 years Pneumonia (Prevnar 15 then Pneumovax 23 at least 1 year apart.  Alternatively, can take Prevnar 20 without needing additional dose) Shingrix (after age 75): 2 doses from 4 weeks to 6 months apart  Please check with your PCP to make sure you are up to date.   If you test POSITIVE for COVID19 and have MILD to MODERATE symptoms: First, call your PCP if you would like to receive COVID19 treatment AND Hold your medications during the infection and for at least 1 week after your symptoms have resolved: Injectable medication (Benlysta, Cimzia, Cosentyx, Enbrel, Humira, Orencia, Remicade, [name], Stelara, Taltz, Tremfya) Methotrexate Leflunomide (Arava) Mycophenolate (Cellcept) Xeljanz, Olumiant, or Rinvoq If you take Actemra or Kevzara, you DO NOT need to hold these for COVID19 infection.  If you test POSITIVE for COVID19 and have NO symptoms: First, call your PCP if you would like to receive COVID19 treatment AND Hold your medications for at least 10 days after the day that you tested positive Injectable medication (Benlysta, Cimzia, Cosentyx, Enbrel, Humira, Orencia, Remicade, [name], Stelara, Taltz, Tremfya) Methotrexate Leflunomide (Arava) Mycophenolate (Cellcept) Xeljanz, Olumiant, or Rinvoq If you take Actemra or Kevzara, you DO NOT need to hold these for COVID19 infection.  If you have signs or symptoms of an infection or start antibiotics: First, call your PCP for workup of your infection. Hold your medication through the infection, until you complete your antibiotics, and until symptoms resolve if you take the following: Injectable medication (Actemra, Benlysta, Cimzia, Cosentyx, Enbrel, Humira, Kevzara, Orencia, Remicade, [name], Stelara, Taltz,  Tremfya) Methotrexate Leflunomide (Arava) Mycophenolate (Cellcept) Xeljanz, Olumiant, or Rinvoq  Heart Disease Prevention   Your inflammatory disease increases your risk of heart disease which includes heart attack, stroke, atrial fibrillation (irregular heartbeats), high blood pressure, heart failure and atherosclerosis (plaque in the arteries).  It is important to reduce your risk by:   Keep blood pressure, cholesterol, and blood sugar at healthy levels   Smoking Cessation   Maintain a healthy weight  BMI 20-25   Eat a healthy diet  Plenty of fresh fruit, vegetables, and whole grains  Limit saturated fats, foods high in sodium, and added sugars  DASH and Mediterranean diet   Increase physical activity  Recommend moderate physically activity for 150 minutes per week/ 30 minutes a day for five days a week These can be broken up into three separate ten-minute sessions during the day.   Reduce Stress  Meditation, slow breathing exercises, yoga, coloring books  Dental visits twice a year      

## 2020-10-20 ENCOUNTER — Other Ambulatory Visit: Payer: Self-pay | Admitting: Pharmacist

## 2020-10-20 DIAGNOSIS — Z79899 Other long term (current) drug therapy: Secondary | ICD-10-CM

## 2020-10-20 DIAGNOSIS — L4050 Arthropathic psoriasis, unspecified: Secondary | ICD-10-CM

## 2020-10-20 DIAGNOSIS — L409 Psoriasis, unspecified: Secondary | ICD-10-CM

## 2020-10-20 NOTE — Progress Notes (Signed)
Next infusion scheduled for [name] Aria on 12/05/20 and due for updated orders. Diagnosis: psoriatic arthritis, psoriasis overlap  Dose: [name] 2mg /kg every 8 weeks (using last recorded weight of 46.4 kg)  Last Clinic Visit: 10/10/20 Next Clinic Visit: 03/22/21  Last infusion: 09/26/20 Labs: 09/26/20 both CBC and CMP wnl TB Gold: negative on 09/26/20   Orders placed for [name] 2mg /kg x 2 doses along with premedication of Tylenol and Benadryl to be administered 30 minutes before medication infusion.  Standing CBC with diff/platelet and CMP with GFR orders placed to be drawn every 2 months.  Next TB gold due June 2023  , PharmD, MPH Clinical Pharmacist (Rheumatology and Pulmonology)

## 2020-11-05 ENCOUNTER — Encounter: Payer: Self-pay | Admitting: Rheumatology

## 2020-11-05 ENCOUNTER — Other Ambulatory Visit: Payer: Self-pay | Admitting: Rheumatology

## 2020-11-06 MED ORDER — METHOTREXATE 2.5 MG PO TABS: ORAL_TABLET | ORAL | 0 refills | Status: DC

## 2020-11-06 NOTE — Telephone Encounter (Signed)
Next Visit: 03/22/2021  Last Visit: 10/10/2020  Last Fill: 08/10/2020  DX: Psoriatic arthropathy  Current Dose per office note 10/10/2020: methotrexate 8 tablets by mouth every 7 days  Labs: 09/26/2020 WNL  Per protocol, okay to refill per Dr. Corliss Skains

## 2020-11-14 ENCOUNTER — Encounter: Payer: Self-pay | Admitting: Rheumatology

## 2020-11-21 ENCOUNTER — Encounter (HOSPITAL_COMMUNITY): Payer: Medicare PPO

## 2020-11-22 ENCOUNTER — Ambulatory Visit (HOSPITAL_COMMUNITY): Payer: Medicare PPO

## 2020-11-23 ENCOUNTER — Ambulatory Visit (HOSPITAL_COMMUNITY)
Admission: RE | Admit: 2020-11-23 | Discharge: 2020-11-23 | Disposition: A | Discharge status: Home / Self Care | Payer: Medicare PPO | Source: Ambulatory Visit | Attending: Rheumatology | Admitting: Rheumatology

## 2020-11-23 DIAGNOSIS — L409 Psoriasis, unspecified: Secondary | ICD-10-CM | POA: Diagnosis present

## 2020-11-23 DIAGNOSIS — L4050 Arthropathic psoriasis, unspecified: Secondary | ICD-10-CM

## 2020-11-23 DIAGNOSIS — Z79899 Other long term (current) drug therapy: Secondary | ICD-10-CM | POA: Diagnosis present

## 2020-11-23 LAB — CBC WITH DIFFERENTIAL/PLATELET
Abs Immature Granulocytes: 0.01 10*3/uL (ref 0.00–0.07)
Basophils Absolute: 0 10*3/uL (ref 0.0–0.1)
Basophils Relative: 1 %
Eosinophils Absolute: 0.1 10*3/uL (ref 0.0–0.5)
Eosinophils Relative: 2 %
HCT: 42 % (ref 36.0–46.0)
Hemoglobin: 13.6 g/dL (ref 12.0–15.0)
Immature Granulocytes: 0 %
Lymphocytes Relative: 37 %
Lymphs Abs: 2.2 10*3/uL (ref 0.7–4.0)
MCH: 32.9 pg (ref 26.0–34.0)
MCHC: 32.4 g/dL (ref 30.0–36.0)
MCV: 101.7 fL — ABNORMAL HIGH (ref 80.0–100.0)
Monocytes Absolute: 0.8 10*3/uL (ref 0.1–1.0)
Monocytes Relative: 14 %
Neutro Abs: 2.8 10*3/uL (ref 1.7–7.7)
Neutrophils Relative %: 46 %
Platelets: 325 10*3/uL (ref 150–400)
RBC: 4.13 MIL/uL (ref 3.87–5.11)
RDW: 12 % (ref 11.5–15.5)
WBC: 6 10*3/uL (ref 4.0–10.5)
nRBC: 0 % (ref 0.0–0.2)

## 2020-11-23 LAB — COMPREHENSIVE METABOLIC PANEL
ALT: 11 U/L (ref 0–44)
AST: 28 U/L (ref 15–41)
Albumin: 3.4 g/dL — ABNORMAL LOW (ref 3.5–5.0)
Alkaline Phosphatase: 62 U/L (ref 38–126)
Anion gap: 9 (ref 5–15)
BUN: 21 mg/dL (ref 8–23)
CO2: 25 mmol/L (ref 22–32)
Calcium: 9 mg/dL (ref 8.9–10.3)
Chloride: 101 mmol/L (ref 98–111)
Creatinine, Ser: 0.55 mg/dL (ref 0.44–1.00)
GFR, Estimated: >60 mL/min (ref >60)
Glucose, Bld: 86 mg/dL (ref 70–99)
Potassium: 4.6 mmol/L (ref 3.5–5.1)
Sodium: 135 mmol/L (ref 135–145)
Total Bilirubin: 0.8 mg/dL (ref 0.3–1.2)
Total Protein: 6.2 g/dL — ABNORMAL LOW (ref 6.5–8.1)

## 2020-11-23 MED ORDER — DIPHENHYDRAMINE HCL 25 MG PO CAPS: 25.0000 mg | ORAL_CAPSULE | ORAL | Status: DC

## 2020-11-23 MED ORDER — SODIUM CHLORIDE 0.9 % IV SOLN
2.0000 mg/kg | INTRAVENOUS | Status: DC
  Administered 2020-11-23: 91.25 mg via INTRAVENOUS
  Filled 2020-11-23: qty 7.3

## 2020-11-23 MED ORDER — ACETAMINOPHEN 325 MG PO TABS: 650.0000 mg | ORAL_TABLET | ORAL | Status: DC

## 2020-11-23 NOTE — Progress Notes (Signed)
Total protein and albumin are slightly low but stable. Rest of CMP WNL.  MCV remains borderline elevated and is trending up.  Please clarify if she has been taking folic acid 2 mg daily.  Rest of CBC WNL.  We will continue to monitor lab work closely.

## 2020-12-05 ENCOUNTER — Encounter (HOSPITAL_COMMUNITY): Payer: Medicare PPO

## 2021-01-18 ENCOUNTER — Ambulatory Visit (HOSPITAL_COMMUNITY)
Admission: RE | Admit: 2021-01-18 | Discharge: 2021-01-18 | Disposition: A | Discharge status: Home / Self Care | Payer: Medicare PPO | Source: Ambulatory Visit | Attending: Rheumatology | Admitting: Rheumatology

## 2021-01-18 ENCOUNTER — Other Ambulatory Visit: Payer: Self-pay

## 2021-01-18 DIAGNOSIS — L4050 Arthropathic psoriasis, unspecified: Secondary | ICD-10-CM | POA: Insufficient documentation

## 2021-01-18 DIAGNOSIS — Z79899 Other long term (current) drug therapy: Secondary | ICD-10-CM | POA: Diagnosis present

## 2021-01-18 DIAGNOSIS — L409 Psoriasis, unspecified: Secondary | ICD-10-CM | POA: Diagnosis present

## 2021-01-18 LAB — COMPREHENSIVE METABOLIC PANEL
ALT: 16 U/L (ref 0–44)
AST: 25 U/L (ref 15–41)
Albumin: 3.6 g/dL (ref 3.5–5.0)
Alkaline Phosphatase: 59 U/L (ref 38–126)
Anion gap: 10 (ref 5–15)
BUN: 19 mg/dL (ref 8–23)
CO2: 24 mmol/L (ref 22–32)
Calcium: 9.2 mg/dL (ref 8.9–10.3)
Chloride: 100 mmol/L (ref 98–111)
Creatinine, Ser: 0.54 mg/dL (ref 0.44–1.00)
GFR, Estimated: >60 mL/min (ref >60)
Glucose, Bld: 99 mg/dL (ref 70–99)
Potassium: 4.3 mmol/L (ref 3.5–5.1)
Sodium: 134 mmol/L — ABNORMAL LOW (ref 135–145)
Total Bilirubin: 1.2 mg/dL (ref 0.3–1.2)
Total Protein: 6.5 g/dL (ref 6.5–8.1)

## 2021-01-18 LAB — CBC WITH DIFFERENTIAL/PLATELET
Abs Immature Granulocytes: 0.01 10*3/uL (ref 0.00–0.07)
Basophils Absolute: 0.1 10*3/uL (ref 0.0–0.1)
Basophils Relative: 1 %
Eosinophils Absolute: 0.1 10*3/uL (ref 0.0–0.5)
Eosinophils Relative: 1 %
HCT: 40.9 % (ref 36.0–46.0)
Hemoglobin: 13.4 g/dL (ref 12.0–15.0)
Immature Granulocytes: 0 %
Lymphocytes Relative: 29 %
Lymphs Abs: 1.9 10*3/uL (ref 0.7–4.0)
MCH: 32.8 pg (ref 26.0–34.0)
MCHC: 32.8 g/dL (ref 30.0–36.0)
MCV: 100.2 fL — ABNORMAL HIGH (ref 80.0–100.0)
Monocytes Absolute: 0.8 10*3/uL (ref 0.1–1.0)
Monocytes Relative: 12 %
Neutro Abs: 3.8 10*3/uL (ref 1.7–7.7)
Neutrophils Relative %: 57 %
Platelets: 310 10*3/uL (ref 150–400)
RBC: 4.08 MIL/uL (ref 3.87–5.11)
RDW: 13 % (ref 11.5–15.5)
WBC: 6.6 10*3/uL (ref 4.0–10.5)
nRBC: 0 % (ref 0.0–0.2)

## 2021-01-18 MED ORDER — ACETAMINOPHEN 325 MG PO TABS: 650.0000 mg | ORAL_TABLET | ORAL | Status: DC

## 2021-01-18 MED ORDER — DIPHENHYDRAMINE HCL 25 MG PO CAPS: 25.0000 mg | ORAL_CAPSULE | ORAL | Status: DC

## 2021-01-18 MED ORDER — SODIUM CHLORIDE 0.9 % IV SOLN
2.0000 mg/kg | INTRAVENOUS | Status: DC
  Administered 2021-01-18: 91.25 mg via INTRAVENOUS
  Filled 2021-01-18: qty 7.3

## 2021-02-01 ENCOUNTER — Other Ambulatory Visit: Payer: Self-pay | Admitting: Pharmacist

## 2021-02-01 DIAGNOSIS — L409 Psoriasis, unspecified: Secondary | ICD-10-CM

## 2021-02-01 DIAGNOSIS — Z79899 Other long term (current) drug therapy: Secondary | ICD-10-CM

## 2021-02-01 DIAGNOSIS — L4050 Arthropathic psoriasis, unspecified: Secondary | ICD-10-CM

## 2021-02-01 NOTE — Progress Notes (Addendum)
Next infusion scheduled for [name] Aria on 03/14/21 and due for updated orders. Diagnosis: PsA/psoriasis  Dose: 2mg /kg every 8 weeks (91.25mg  based on last recorded weight of 45.5 kg)  Last Clinic Visit: 10/10/20 Next Clinic Visit: 03/22/21  Last infusion: 01/18/21  Labs: CBC and CMP on 01/18/21 - CBC and CMP wnl TB Gold: negative on 09/26/20   Orders placed for [name] Aria x 2 doses along with premedication of acetaminophen and diphenhydramine to be administered 30 minutes before medication infusion.  Standing CBC with diff/platelet and CMP with GFR orders placed to be drawn every 2 months.  Next TB gold due 09/26/21  11/26/21, PharmD, MPH, BCPS Clinical Pharmacist (Rheumatology and Pulmonology)

## 2021-02-07 ENCOUNTER — Other Ambulatory Visit: Payer: Self-pay | Admitting: Rheumatology

## 2021-02-07 MED ORDER — METHOTREXATE 2.5 MG PO TABS: ORAL_TABLET | ORAL | 0 refills | Status: DC

## 2021-02-07 NOTE — Telephone Encounter (Signed)
Next Visit: 03/22/2021   Last Visit: 10/10/2020   Last Fill: 11/06/2020   DX: Psoriatic arthropathy   Current Dose per office note 10/10/2020: methotrexate 8 tablets by mouth every 7 days   Labs: 01/18/2021 Sodium is borderline low-134. Not of concern at this time. Rest of CMP WNL.  CBC WNL.   Okay to refill MTX?

## 2021-03-14 ENCOUNTER — Other Ambulatory Visit: Payer: Self-pay

## 2021-03-14 ENCOUNTER — Ambulatory Visit (HOSPITAL_COMMUNITY)
Admission: RE | Admit: 2021-03-14 | Discharge: 2021-03-14 | Disposition: A | Discharge status: Home / Self Care | Payer: Medicare PPO | Source: Ambulatory Visit | Attending: Rheumatology | Admitting: Rheumatology

## 2021-03-14 DIAGNOSIS — L4050 Arthropathic psoriasis, unspecified: Secondary | ICD-10-CM | POA: Diagnosis present

## 2021-03-14 DIAGNOSIS — L409 Psoriasis, unspecified: Secondary | ICD-10-CM | POA: Diagnosis present

## 2021-03-14 DIAGNOSIS — Z79899 Other long term (current) drug therapy: Secondary | ICD-10-CM

## 2021-03-14 LAB — CBC WITH DIFFERENTIAL/PLATELET
Abs Immature Granulocytes: 0.04 10*3/uL (ref 0.00–0.07)
Basophils Absolute: 0.1 10*3/uL (ref 0.0–0.1)
Basophils Relative: 1 %
Eosinophils Absolute: 0.2 10*3/uL (ref 0.0–0.5)
Eosinophils Relative: 2 %
HCT: 41.7 % (ref 36.0–46.0)
Hemoglobin: 14 g/dL (ref 12.0–15.0)
Immature Granulocytes: 0 %
Lymphocytes Relative: 18 %
Lymphs Abs: 1.7 10*3/uL (ref 0.7–4.0)
MCH: 33.3 pg (ref 26.0–34.0)
MCHC: 33.6 g/dL (ref 30.0–36.0)
MCV: 99.3 fL (ref 80.0–100.0)
Monocytes Absolute: 1.1 10*3/uL — ABNORMAL HIGH (ref 0.1–1.0)
Monocytes Relative: 11 %
Neutro Abs: 6.6 10*3/uL (ref 1.7–7.7)
Neutrophils Relative %: 68 %
Platelets: 345 10*3/uL (ref 150–400)
RBC: 4.2 MIL/uL (ref 3.87–5.11)
RDW: 13.2 % (ref 11.5–15.5)
WBC: 9.6 10*3/uL (ref 4.0–10.5)
nRBC: 0 % (ref 0.0–0.2)

## 2021-03-14 LAB — COMPREHENSIVE METABOLIC PANEL
ALT: 14 U/L (ref 0–44)
AST: 34 U/L (ref 15–41)
Albumin: 3.5 g/dL (ref 3.5–5.0)
Alkaline Phosphatase: 59 U/L (ref 38–126)
Anion gap: 9 (ref 5–15)
BUN: 20 mg/dL (ref 8–23)
CO2: 24 mmol/L (ref 22–32)
Calcium: 9 mg/dL (ref 8.9–10.3)
Chloride: 101 mmol/L (ref 98–111)
Creatinine, Ser: 0.57 mg/dL (ref 0.44–1.00)
GFR, Estimated: >60 mL/min (ref >60)
Glucose, Bld: 91 mg/dL (ref 70–99)
Potassium: 4.7 mmol/L (ref 3.5–5.1)
Sodium: 134 mmol/L — ABNORMAL LOW (ref 135–145)
Total Bilirubin: 1.3 mg/dL — ABNORMAL HIGH (ref 0.3–1.2)
Total Protein: 6.3 g/dL — ABNORMAL LOW (ref 6.5–8.1)

## 2021-03-14 MED ORDER — SODIUM CHLORIDE 0.9 % IV SOLN
2.0000 mg/kg | INTRAVENOUS | Status: DC
  Administered 2021-03-14: 91.25 mg via INTRAVENOUS
  Filled 2021-03-14: qty 7.3

## 2021-03-14 MED ORDER — ACETAMINOPHEN 325 MG PO TABS: 650.0000 mg | ORAL_TABLET | ORAL | Status: DC

## 2021-03-14 MED ORDER — DIPHENHYDRAMINE HCL 25 MG PO CAPS: 25.0000 mg | ORAL_CAPSULE | ORAL | Status: DC

## 2021-03-14 NOTE — Progress Notes (Signed)
Total protein is borderline low-6.3.  Total bilirubin is borderline elevated. Please forward results to PCP.  We will continue to monitor.

## 2021-03-14 NOTE — Progress Notes (Signed)
CBC WNL

## 2021-03-16 NOTE — Progress Notes (Signed)
Office Visit Note  Patient: Amy Hayes             Date of Birth: 1946-03-14           MRN: [id number]             PCP: Milda Smart, MD Referring: Milda Smart, MD Visit Date: 03/28/2021 Occupation: @GUAROCC @  Subjective:  Medication management.   History of Present Illness: Amy Hayes is a 75 y.o. female with a history of psoriatic arthritis, psoriasis, osteoarthritis and degenerative disc disease.  She denies any increased joint pain or joint swelling.  She has few psoriasis patches on her back.  She has been getting [name] Aria infusions and also taking methotrexate on a regular basis.  She was having lower back pain and had radiofrequency ablation yesterday which helped.  She continues to have some Raynauds symptoms.  Activities of Daily Living:  Patient reports morning stiffness for a few  minutes.   Patient Denies nocturnal pain.  Difficulty dressing/grooming: Denies Difficulty climbing stairs: Denies Difficulty getting out of chair: Denies Difficulty using hands for taps, buttons, cutlery, and/or writing: Reports  Review of Systems  Constitutional:  Positive for fatigue.  HENT:  Negative for mouth sores, mouth dryness and nose dryness.   Eyes:  Positive for dryness. Negative for pain and itching.  Respiratory:  Negative for shortness of breath and difficulty breathing.   Cardiovascular:  Negative for chest pain and palpitations.  Gastrointestinal:  Negative for blood in stool, constipation and diarrhea.  Endocrine: Negative for increased urination.  Genitourinary:  Negative for difficulty urinating.  Musculoskeletal:  Positive for joint pain, joint pain, joint swelling and morning stiffness. Negative for myalgias, muscle tenderness and myalgias.  Skin:  Positive for color change and rash. Negative for redness and sensitivity to sunlight.  Allergic/Immunologic: Negative for susceptible to infections.  Neurological:  Negative for dizziness, numbness,  headaches, memory loss and weakness.  Hematological:  Positive for bruising/bleeding tendency.  Psychiatric/Behavioral:  Negative for confusion and sleep disturbance. The patient is not nervous/anxious.    PMFS History:  Patient Active Problem List   Diagnosis Date Noted   Essential hypertension 12/23/2019   Pain in both lower extremities 12/23/2019   Psoriatic arthropathy (HCC) 12/22/2018   DDD (degenerative disc disease), lumbar 07/24/2017   Primary osteoarthritis of both hands 03/04/2017   DDD (degenerative disc disease), cervical s/p Fusion  10/30/2016   Raynaud's disease without gangrene 10/30/2016   History of migraine 10/30/2016   History of gastroesophageal reflux (GERD) 10/30/2016   High risk medication use 10/30/2016   Psoriasis 05/02/2016   Bilateral low back pain with sciatica 05/02/2016   Osteopenia 05/02/2016    Past Medical History:  Diagnosis Date   Arthritis    GERD (gastroesophageal reflux disease)    Hyperlipidemia    Hypertension    Migraine    Osteoporosis     Family History  Problem Relation Age of Onset   Leukemia Mother    Stroke Father    Hypertension Sister    Hypertension Brother    Stroke Brother    Past Surgical History:  Procedure Laterality Date   ABDOMINAL HYSTERECTOMY     CARPAL TUNNEL RELEASE     CERVICAL FUSION     EYE SURGERY     ingrown toenail removal     KNEE ARTHROSCOPY Right    KNEE CARTILAGE SURGERY     ROTATOR CUFF REPAIR     SPINAL FUSION  cervical fusion   Social History   Social History Narrative   Not on file   Immunization History  Administered Date(s) Administered   Influenza Split 02/15/2010   Influenza, High Dose Seasonal PF 02/06/2015, 01/23/2016, 01/20/2017, 02/20/2018   Influenza, Seasonal, Injecte, Preservative Fre 02/11/2011, 01/04/2013, 04/05/2014   Influenza,trivalent, recombinat, inj, PF 01/31/2012   PFIZER(Purple Top)SARS-COV-2 Vaccination 07/05/2019, 07/26/2019, 12/17/2019, 07/04/2020,  03/07/2021   Pneumococcal Conjugate-13 08/11/2014   Pneumococcal Polysaccharide-23 05/29/2009, 08/29/2015   Td 07/06/2018   Tdap 12/17/2007   Zoster Recombinat (Shingrix) 02/27/2018, 06/12/2018     Objective: Vital Signs: BP 124/69 (BP Location: Left Arm, Patient Position: Sitting, Cuff Size: Normal)   Pulse 60   Ht 5' (1.524 m)   Wt 102 lb 3.2 oz (46.4 kg)   BMI 19.96 kg/m    Physical Exam Vitals and nursing note reviewed.  Constitutional:      Appearance: She is well-developed.  HENT:     Head: Normocephalic and atraumatic.  Eyes:     Conjunctiva/sclera: Conjunctivae normal.  Cardiovascular:     Rate and Rhythm: Normal rate and regular rhythm.     Heart sounds: Normal heart sounds.  Pulmonary:     Effort: Pulmonary effort is normal.     Breath sounds: Normal breath sounds.  Abdominal:     General: Bowel sounds are normal.     Palpations: Abdomen is soft.  Musculoskeletal:     Cervical back: Normal range of motion.  Lymphadenopathy:     Cervical: No cervical adenopathy.  Skin:    General: Skin is warm and dry.     Capillary Refill: Capillary refill takes less than 2 seconds.  Neurological:     Mental Status: She is alert and oriented to person, place, and time.  Psychiatric:        Behavior: Behavior normal.     Musculoskeletal Exam: She had limited range of motion of her cervical spine.  She has thoracic scoliosis with limited range of motion of her lumbar spine.  Shoulder joints, elbow joints, wrist joints with good range of motion.  She had bilateral severe PIP and DIP thickening with subluxation with no synovitis.  Hip joints and knee joints in good range of motion.  She had no tenderness over ankles or MTPs.  CDAI Exam: CDAI Score: -- Patient Global: --; Provider Global: -- Swollen: --; Tender: -- Joint Exam 03/28/2021   No joint exam has been documented for this visit   There is currently no information documented on the homunculus. Go to the  Rheumatology activity and complete the homunculus joint exam.  Investigation: No additional findings.  Imaging: No results found.  Recent Labs: Lab Results  Component Value Date   WBC 9.6 03/14/2021   HGB 14.0 03/14/2021   PLT 345 03/14/2021   NA 134 (L) 03/14/2021   K 4.7 03/14/2021   CL 101 03/14/2021   CO2 24 03/14/2021   GLUCOSE 91 03/14/2021   BUN 20 03/14/2021   CREATININE 0.57 03/14/2021   BILITOT 1.3 (H) 03/14/2021   ALKPHOS 59 03/14/2021   AST 34 03/14/2021   ALT 14 03/14/2021   PROT 6.3 (L) 03/14/2021   ALBUMIN 3.5 03/14/2021   CALCIUM 9.0 03/14/2021   GFRAA >60 12/09/2019   QFTBGOLDPLUS Negative 09/26/2020    Speciality Comments: [name] Aria infusions every 8 weeks, started on 11/2016 TB gold negative 08/01/16  Procedures:  No procedures performed Allergies: Codeine and Iodine   Assessment / Plan:     Visit Diagnoses:  Psoriatic arthropathy (HCC)-she is doing well on the combination of [name] Aria infusions and methotrexate 8 tablets p.o. weekly.  She had no synovitis on examination.  I advised her to reduce methotrexate to 7 tablets p.o. weekly for 1 month and if tolerated she can decrease it to 6 tablets p.o. weekly.  If she start experiencing increased pain we can increase the dose of methotrexate.  Psoriasis-she gives history of occasional rashes on her back.  High risk medication use - [name] Aria IV 2 mg/kg every 8 weeks, methotrexate 8 tablets by mouth every 7 days, and folic acid 1 mg 2 tablets daily.  Labs from March 14, 2021 were reviewed which were within normal limits.  TB gold was negative on September 26, 2020.  Her bilirubin was mildly elevated.  I have advised her to discuss this further with her PCP.  Information regarding immunization was placed in the AVS.  She was advised to hold [name] Aria infusions and methotrexate in case she develops an infection.  She may resume medications once the infection resolves.  She is advised to get yearly skin  examination to screen for skin cancer.  Use of sunscreen and protective clothing was discussed.  Primary osteoarthritis of both hands-she has severe osteoarthritis in her hands with DIP and PIP thickening.  DDD (degenerative disc disease), cervical-s/p fusion she has limited range of motion.  DDD (degenerative disc disease), lumbar-she has a scoliosis and chronic pain.  She recently had radiofrequency ablation which has been successful.  Raynaud's disease without gangrene-warm clothing and keeping core temperature warm was discussed.  Osteopenia of multiple sites - DEXA scan with her PCP. DEXA 09/14/2019  History of gastroesophageal reflux (GERD)  History of migraine  Essential hypertension-blood pressure is normal now.  Orders: No orders of the defined types were placed in this encounter.  Meds ordered this encounter  Medications   lidocaine (LIDODERM) 5 %    Sig: Place 2 patches onto the skin daily. Remove & Discard patch within 12 hours or as directed by MD    Dispense:  60 patch    Refill:  4      Follow-Up Instructions: Return in about 5 months (around 08/26/2021) for Psoriatic arthritis.   Bo Merino, MD  Note - This record has been created using Editor, commissioning.  Chart creation errors have been sought, but may not always  have been located. Such creation errors do not reflect on  the standard of medical care.

## 2021-03-22 ENCOUNTER — Ambulatory Visit: Payer: Medicare PPO | Admitting: Rheumatology

## 2021-03-27 ENCOUNTER — Ambulatory Visit: Payer: Medicare PPO | Admitting: Rheumatology

## 2021-03-28 ENCOUNTER — Encounter: Payer: Self-pay | Admitting: Rheumatology

## 2021-03-28 ENCOUNTER — Other Ambulatory Visit: Payer: Self-pay

## 2021-03-28 ENCOUNTER — Ambulatory Visit (INDEPENDENT_AMBULATORY_CARE_PROVIDER_SITE_OTHER): Payer: Medicare PPO | Admitting: Rheumatology

## 2021-03-28 VITALS — BP 124/69 | HR 60 | Ht 60.0 in | Wt 102.2 lb

## 2021-03-28 DIAGNOSIS — M5030 Other cervical disc degeneration, unspecified cervical region: Secondary | ICD-10-CM

## 2021-03-28 DIAGNOSIS — M5136 Other intervertebral disc degeneration, lumbar region: Secondary | ICD-10-CM

## 2021-03-28 DIAGNOSIS — M19041 Primary osteoarthritis, right hand: Secondary | ICD-10-CM | POA: Diagnosis not present

## 2021-03-28 DIAGNOSIS — Z79899 Other long term (current) drug therapy: Secondary | ICD-10-CM

## 2021-03-28 DIAGNOSIS — M51369 Other intervertebral disc degeneration, lumbar region without mention of lumbar back pain or lower extremity pain: Secondary | ICD-10-CM

## 2021-03-28 DIAGNOSIS — L4050 Arthropathic psoriasis, unspecified: Secondary | ICD-10-CM

## 2021-03-28 DIAGNOSIS — Z8669 Personal history of other diseases of the nervous system and sense organs: Secondary | ICD-10-CM

## 2021-03-28 DIAGNOSIS — L409 Psoriasis, unspecified: Secondary | ICD-10-CM

## 2021-03-28 DIAGNOSIS — I10 Essential (primary) hypertension: Secondary | ICD-10-CM

## 2021-03-28 DIAGNOSIS — M19042 Primary osteoarthritis, left hand: Secondary | ICD-10-CM

## 2021-03-28 DIAGNOSIS — I7300 Raynaud's syndrome without gangrene: Secondary | ICD-10-CM

## 2021-03-28 DIAGNOSIS — Z8719 Personal history of other diseases of the digestive system: Secondary | ICD-10-CM

## 2021-03-28 DIAGNOSIS — M8589 Other specified disorders of bone density and structure, multiple sites: Secondary | ICD-10-CM

## 2021-03-28 MED ORDER — LIDOCAINE 5 % EX PTCH: 2.0000 | MEDICATED_PATCH | CUTANEOUS | 4 refills | Status: DC

## 2021-03-28 NOTE — Patient Instructions (Signed)
Standing Labs We placed an order today for your standing lab work.   Please have your standing labs drawn in February and every 3 months  If possible, please have your labs drawn 2 weeks prior to your appointment so that the provider can discuss your results at your appointment.  Please note that you may see your imaging and lab results in MyChart before we have reviewed them. We may be awaiting multiple results to interpret others before contacting you. Please allow our office up to 72 hours to thoroughly review all of the results before contacting the office for clarification of your results.  We have open lab daily: Monday through Thursday from 1:30-4:30 PM and Friday from 1:30-4:00 PM at the office of Dr. Pollyann Savoy, [facility].   Please be advised, all patients with office appointments requiring lab work will take precedent over walk-in lab work.  If possible, please come for your lab work on Monday and Friday afternoons, as you may experience shorter wait times. The office is located at [address], [address], [place], [place] [address] No appointment is necessary.   Labs are drawn by [facility]. Please bring your co-pay at the time of your lab draw.  You may receive a bill from [facility] for your lab work.  If you wish to have your labs drawn at another location, please call the office 24 hours in advance to send orders.  If you have any questions regarding directions or hours of operation,  please call [phone].   As a reminder, please drink plenty of water prior to coming for your lab work. Thanks!   Please get annual skin examination to screen for his skin cancer by dermatologist  Vaccines You are taking a medication(s) that can suppress your immune system.  The following immunizations are recommended: Flu annually Covid-19  Td/Tdap (tetanus, diphtheria, pertussis) every 10 years Pneumonia (Prevnar 15 then Pneumovax 23 at least 1 year apart.   Alternatively, can take Prevnar 20 without needing additional dose) Shingrix: 2 doses from 4 weeks to 6 months apart  Please check with your PCP to make sure you are up to date.   If you have signs or symptoms of an infection or start antibiotics: First, call your PCP for workup of your infection. Hold your medication through the infection, until you complete your antibiotics, and until symptoms resolve if you take the following: Injectable medication (Actemra, Benlysta, Cimzia, Cosentyx, Enbrel, Humira, Kevzara, Orencia, Remicade, [name], Stelara, Taltz, Tremfya) Methotrexate Leflunomide (Arava) Mycophenolate (Cellcept) Harriette Ohara, Olumiant, or Rinvoq

## 2021-04-18 ENCOUNTER — Telehealth: Payer: Self-pay | Admitting: Pharmacy Technician

## 2021-04-18 NOTE — Telephone Encounter (Signed)
Submitted a Prior Authorization request to [facility] for [facility] ARIA via CoverMyMeds. Will update once we receive a response.   KeyCatalina Antigua - PA Case ID: [id number]

## 2021-04-19 NOTE — Telephone Encounter (Signed)
Received notification from [facility] regarding a prior authorization for [facility]. Authorization has been APPROVED from 04/29/2019 to 04/21/2022.   Updated [employer] with current Prior Authorization information.  Authorization # [id number]

## 2021-04-24 ENCOUNTER — Telehealth: Payer: Self-pay | Admitting: Rheumatology

## 2021-04-24 NOTE — Telephone Encounter (Signed)
Patients husband called the office regarding her [name] infusion appointment on Jan 18th. He stated that they recently changed insurances to [facility] and her infusion needs approval from her physician.

## 2021-05-01 ENCOUNTER — Telehealth: Payer: Self-pay | Admitting: Rheumatology

## 2021-05-01 NOTE — Telephone Encounter (Signed)
Patient states she still has some of a runny nose. Patient denies any cough or congestion. Patient states she finished her antibiotic today. When do you suggest patient should resume the MTX.  Please advise.

## 2021-05-01 NOTE — Telephone Encounter (Signed)
Please start [name] Aria BIV through medical benefit.  [name] - [id number], [id number]  Dose: 2mg /kg every 8 weeks  Dx: Psoriatic arthritis (L40.5) and Psoriasis (L40.9)  , PharmD, MPH, BCPS Clinical Pharmacist (Rheumatology and Pulmonology)

## 2021-05-01 NOTE — Telephone Encounter (Signed)
Patient's husband advised that patient should continue to hold methotrexate until her symptoms have completely resolved.  If her symptoms persist she should seek further evaluation by her PCP.  He expressed understanding and will advise patient. Patient is scheduled for a [name] Aria infusion on 05/09/2021. Advised him to advise patient if she is still having symptoms she should postpone her infusion.

## 2021-05-01 NOTE — Telephone Encounter (Signed)
Patients husband called the office stating Amy Hayes has had a bad sinus infection and has been on a 10 day course of amoxicillin. He stated that she finished the antibiotic this morning. He wants to know when she can resume her Methotrexate again.

## 2021-05-01 NOTE — Telephone Encounter (Signed)
She should continue to hold methotrexate until her symptoms have completely resolved.  If her symptoms persist she should seek further evaluation by her PCP.

## 2021-05-02 NOTE — Telephone Encounter (Addendum)
Submitted a Prior Authorization request to [place] for  [facility]  via CoverMyMeds. Will update once we receive a response. Unclear if this is medical or pharmacy.  KeyEldridge Scot PA Case ID: [id number]  Called provider services [phone]) for coverage for [phone]. Started precertification via phone. Faxed clinicals. [phone] Ref #: [id number]  Knox Saliva, PharmD, MPH, BCPS Clinical Pharmacist (Rheumatology and Pulmonology)

## 2021-05-03 ENCOUNTER — Other Ambulatory Visit (HOSPITAL_COMMUNITY): Payer: Self-pay

## 2021-05-03 NOTE — Telephone Encounter (Addendum)
Received notification from [place] regarding a prior authorization for [facility]. Authorization has been APPROVED through PHARMACY benefits from 04/22/2021 to 04/21/2022.   Per test claim, copay for 56 days supply is $879.85  Patient can fill through [facility] [place]: [phone]   Authorization # [id number]    Will begin BIV through medical benefits as well.

## 2021-05-07 NOTE — Telephone Encounter (Signed)
Received notification from [employer] regarding a pre-certification for [facility] ARIA through medical benefit (S8546) Authorization has been APPROVED from 05/02/21 to 05/02/22.   Qty: 658 units J code [id number]  Approval letter sent to scan center. Notified patient via MyChart.  Authorization # [id number] Phone # [phone]

## 2021-05-09 ENCOUNTER — Ambulatory Visit (HOSPITAL_COMMUNITY)
Admission: RE | Admit: 2021-05-09 | Discharge: 2021-05-09 | Disposition: A | Discharge status: Home / Self Care | Payer: Medicare (Managed Care) | Source: Ambulatory Visit | Attending: Rheumatology | Admitting: Rheumatology

## 2021-05-09 ENCOUNTER — Other Ambulatory Visit: Payer: Self-pay

## 2021-05-09 DIAGNOSIS — L4050 Arthropathic psoriasis, unspecified: Secondary | ICD-10-CM | POA: Diagnosis present

## 2021-05-09 DIAGNOSIS — Z79899 Other long term (current) drug therapy: Secondary | ICD-10-CM | POA: Insufficient documentation

## 2021-05-09 DIAGNOSIS — L409 Psoriasis, unspecified: Secondary | ICD-10-CM | POA: Diagnosis present

## 2021-05-09 MED ORDER — ACETAMINOPHEN 325 MG PO TABS: 650.0000 mg | ORAL_TABLET | ORAL | Status: DC

## 2021-05-09 MED ORDER — SODIUM CHLORIDE 0.9 % IV SOLN
2.0000 mg/kg | INTRAVENOUS | Status: AC
  Administered 2021-05-09: 91.25 mg via INTRAVENOUS
  Filled 2021-05-09: qty 7.3

## 2021-05-09 MED ORDER — DIPHENHYDRAMINE HCL 25 MG PO CAPS: 25.0000 mg | ORAL_CAPSULE | ORAL | Status: DC

## 2021-05-10 ENCOUNTER — Telehealth: Payer: Self-pay

## 2021-05-10 NOTE — Telephone Encounter (Signed)
Patient left a voicemail requesting a return call regarding her labwork results.   °

## 2021-05-10 NOTE — Telephone Encounter (Signed)
Patient advised there are no labs to review. Patient advised labs were not drawn at this infusion.

## 2021-05-11 NOTE — Telephone Encounter (Signed)
Patient advised we will make sure her labs are drawn with her next infusion. Patient expressed understanding.

## 2021-05-11 NOTE — Telephone Encounter (Signed)
Labs not drawn. Will have drawn with upcoming infusion in March 2023. I am unable to see how orders were placed now that they have been released.  Chesley Mires, PharmD, MPH, BCPS Clinical Pharmacist (Rheumatology and Pulmonology)

## 2021-06-05 ENCOUNTER — Other Ambulatory Visit: Payer: Self-pay | Admitting: *Deleted

## 2021-06-05 NOTE — Telephone Encounter (Signed)
Next Visit: 08/29/2021  Last Visit: 03/28/2021  Last Fill: 02/07/2021  DX: Psoriatic arthropathy   Current Dose per office note 03/28/2021: methotrexate to 7 tablets p.o. weekly for 1 month and if tolerated she can decrease it to 6 tablets p.o. weekly.   Labs: 03/14/2021 CBC WNL Total protein is borderline low-6.3.  Total bilirubin is borderline elevated.  Patient will be updating labs with her next infusion on 07/04/2021.  Attempted to contact the patient and left message to advise patient to call the office to verify dose of MTX she is taking.   Okay to refill MTX?

## 2021-06-06 ENCOUNTER — Other Ambulatory Visit: Payer: Self-pay | Admitting: Physician Assistant

## 2021-06-06 MED ORDER — METHOTREXATE 2.5 MG PO TABS: 15.0000 mg | ORAL_TABLET | ORAL | 0 refills | Status: DC

## 2021-06-06 NOTE — Telephone Encounter (Signed)
Patient called the office and verified she take 6 tabs weekly.

## 2021-06-06 NOTE — Telephone Encounter (Signed)
Attempted to contact the patient and left message for patient to call the office.  

## 2021-06-06 NOTE — Addendum Note (Signed)
Addended by: Henriette Combs on: 06/06/2021 03:06 PM   Modules accepted: Orders

## 2021-06-25 ENCOUNTER — Telehealth: Payer: Self-pay | Admitting: Pharmacist

## 2021-06-25 ENCOUNTER — Other Ambulatory Visit: Payer: Self-pay | Admitting: Pharmacist

## 2021-06-25 DIAGNOSIS — L409 Psoriasis, unspecified: Secondary | ICD-10-CM

## 2021-06-25 DIAGNOSIS — Z79899 Other long term (current) drug therapy: Secondary | ICD-10-CM

## 2021-06-25 DIAGNOSIS — L4050 Arthropathic psoriasis, unspecified: Secondary | ICD-10-CM

## 2021-06-25 DIAGNOSIS — Z111 Encounter for screening for respiratory tuberculosis: Secondary | ICD-10-CM

## 2021-06-25 NOTE — Progress Notes (Signed)
Next infusion scheduled for [name] Aria on 07/04/21 and due for updated orders. ?Diagnosis: PsA/psoriasis ? ?Dose: 2 mg/kg every 8 weeks (88.75mg  based on last recorded weight of 44.3kg) ? ?Last Clinic Visit: 03/28/21 ?Next Clinic Visit: 08/29/21 ? ?Last infusion: 05/09/21 ? ?Labs: 03/14/21 - CBC, CMP ?TB Gold: negative on 09/26/20  ? ?Orders placed for [name] Aria x 2 doses along with premedication of acetaminophen and diphenhydramine to be administered 30 minutes before medication infusion. ? ?Standing CBC with diff/platelet and CMP with GFR orders placed to be drawn every 2 months.  Next TB gold due 09/26/21 ? ?Chesley Mires, PharmD, MPH, BCPS ?Clinical Pharmacist (Rheumatology and Pulmonology) ?

## 2021-06-25 NOTE — Telephone Encounter (Signed)
Error

## 2021-07-04 ENCOUNTER — Other Ambulatory Visit: Payer: Self-pay

## 2021-07-04 ENCOUNTER — Ambulatory Visit (HOSPITAL_COMMUNITY)
Admission: RE | Admit: 2021-07-04 | Discharge: 2021-07-04 | Disposition: A | Discharge status: Home / Self Care | Payer: Medicare (Managed Care) | Source: Ambulatory Visit | Attending: Rheumatology | Admitting: Rheumatology

## 2021-07-04 VITALS — BP 114/56 | HR 55 | Temp 97.3°F | Resp 18 | Wt 100.2 lb

## 2021-07-04 DIAGNOSIS — Z79899 Other long term (current) drug therapy: Secondary | ICD-10-CM | POA: Insufficient documentation

## 2021-07-04 DIAGNOSIS — M19042 Primary osteoarthritis, left hand: Secondary | ICD-10-CM | POA: Insufficient documentation

## 2021-07-04 DIAGNOSIS — L409 Psoriasis, unspecified: Secondary | ICD-10-CM | POA: Insufficient documentation

## 2021-07-04 DIAGNOSIS — M19041 Primary osteoarthritis, right hand: Secondary | ICD-10-CM | POA: Insufficient documentation

## 2021-07-04 DIAGNOSIS — L4050 Arthropathic psoriasis, unspecified: Secondary | ICD-10-CM | POA: Insufficient documentation

## 2021-07-04 LAB — COMPREHENSIVE METABOLIC PANEL
ALT: 14 U/L (ref 0–44)
AST: 24 U/L (ref 15–41)
Albumin: 3.5 g/dL (ref 3.5–5.0)
Alkaline Phosphatase: 69 U/L (ref 38–126)
Anion gap: 9 (ref 5–15)
BUN: 21 mg/dL (ref 8–23)
CO2: 23 mmol/L (ref 22–32)
Calcium: 8.7 mg/dL — ABNORMAL LOW (ref 8.9–10.3)
Chloride: 101 mmol/L (ref 98–111)
Creatinine, Ser: 0.53 mg/dL (ref 0.44–1.00)
GFR, Estimated: >60 mL/min (ref >60)
Glucose, Bld: 86 mg/dL (ref 70–99)
Potassium: 4.4 mmol/L (ref 3.5–5.1)
Sodium: 133 mmol/L — ABNORMAL LOW (ref 135–145)
Total Bilirubin: 0.6 mg/dL (ref 0.3–1.2)
Total Protein: 6.8 g/dL (ref 6.5–8.1)

## 2021-07-04 LAB — CBC WITH DIFFERENTIAL/PLATELET
Abs Immature Granulocytes: 0.03 10*3/uL (ref 0.00–0.07)
Basophils Absolute: 0 10*3/uL (ref 0.0–0.1)
Basophils Relative: 0 %
Eosinophils Absolute: 0.2 10*3/uL (ref 0.0–0.5)
Eosinophils Relative: 1 %
HCT: 39.6 % (ref 36.0–46.0)
Hemoglobin: 12.9 g/dL (ref 12.0–15.0)
Immature Granulocytes: 0 %
Lymphocytes Relative: 17 %
Lymphs Abs: 2 10*3/uL (ref 0.7–4.0)
MCH: 32.6 pg (ref 26.0–34.0)
MCHC: 32.6 g/dL (ref 30.0–36.0)
MCV: 100 fL (ref 80.0–100.0)
Monocytes Absolute: 0.9 10*3/uL (ref 0.1–1.0)
Monocytes Relative: 8 %
Neutro Abs: 8.9 10*3/uL — ABNORMAL HIGH (ref 1.7–7.7)
Neutrophils Relative %: 74 %
Platelets: 341 10*3/uL (ref 150–400)
RBC: 3.96 MIL/uL (ref 3.87–5.11)
RDW: 13.2 % (ref 11.5–15.5)
WBC: 12 10*3/uL — ABNORMAL HIGH (ref 4.0–10.5)
nRBC: 0 % (ref 0.0–0.2)

## 2021-07-04 MED ORDER — ACETAMINOPHEN 325 MG PO TABS: 650.0000 mg | ORAL_TABLET | ORAL | Status: DC

## 2021-07-04 MED ORDER — DIPHENHYDRAMINE HCL 25 MG PO CAPS: 25.0000 mg | ORAL_CAPSULE | ORAL | Status: DC

## 2021-07-04 MED ORDER — SODIUM CHLORIDE 0.9 % IV SOLN
2.0000 mg/kg | INTRAVENOUS | Status: DC
  Administered 2021-07-04: 88.75 mg via INTRAVENOUS
  Filled 2021-07-04: qty 7.1

## 2021-07-04 NOTE — Progress Notes (Signed)
WBC count is slightly elevated.  Absolute neutrophils are elevated-please clarify if she has had a recent infection? Rest of CBC with diff WNL.

## 2021-07-04 NOTE — Progress Notes (Signed)
Calcium is borderline low-Please advise the patient to increase her dietary calcium intake. Sodium remains borderline low.  Rest of CMP WNL.

## 2021-07-24 NOTE — Progress Notes (Signed)
? ?Office Visit Note ? ?Patient: Amy Hayes             ?Date of Birth: [birth year]           ?MRN: [id number]             ?PCP: Milda Smartalton, Timothy J, MD ?Referring: Milda Smartalton, Timothy J, MD ?Visit Date: 07/25/2021 ?Occupation: @GUAROCC @ ? ?Subjective:  ?Left leg pain ? ?History of Present Illness: Amy DomeLinda N Litz is a 76 y.o. female with history of psoriatic arthritis, osteoarthritis and degenerative disc disease.  She states for the last 1 months she has been getting increased pain and discomfort in her lower back.  [place]  She had a cortisone injection in her back which did not give her any relief so far.  She states she has been experiencing pain in the back of her left thigh and on the medial aspect of her thigh.  She states the pain is going into her groin.  She denies any difficulty walking.  She states she does not feel very steady .  She does not have any psoriatic arthritis flare.  Denies psoriasis flare. ? ?Activities of Daily Living:  ?Patient reports morning stiffness for all day. ?Patient Denies nocturnal pain.  ?Difficulty dressing/grooming: Denies ?Difficulty climbing stairs: Reports ?Difficulty getting out of chair: Denies ?Difficulty using hands for taps, buttons, cutlery, and/or writing: Denies ? ?Review of Systems  ?Constitutional:  Positive for fatigue.  ?HENT:  Positive for nosebleeds. Negative for mouth sores, mouth dryness and nose dryness.   ?Eyes:  Negative for pain, itching and dryness.  ?Respiratory:  Negative for shortness of breath and difficulty breathing.   ?Cardiovascular:  Negative for chest pain and palpitations.  ?Gastrointestinal:  Negative for blood in stool, constipation and diarrhea.  ?Endocrine: Negative for increased urination.  ?Genitourinary:  Negative for difficulty urinating.  ?Musculoskeletal:  Positive for joint pain, joint pain, joint swelling, myalgias, morning stiffness, muscle tenderness and myalgias.  ?Skin:  Negative  for color change, rash and redness.  ?Allergic/Immunologic: Negative for susceptible to infections.  ?Neurological:  Positive for headaches. Negative for dizziness, numbness, memory loss and weakness.  ?Hematological:  Positive for bruising/bleeding tendency.  ?Psychiatric/Behavioral:  Negative for confusion.   ? ?PMFS History:  ?Patient Active Problem List  ? Diagnosis Date Noted  ? Essential hypertension 12/23/2019  ? Pain in both lower extremities 12/23/2019  ? Psoriatic arthropathy (HCC) 12/22/2018  ? DDD (degenerative disc disease), lumbar 07/24/2017  ? Primary osteoarthritis of both hands 03/04/2017  ? DDD (degenerative disc disease), cervical s/p Fusion  10/30/2016  ? Raynaud's disease without gangrene 10/30/2016  ? History of migraine 10/30/2016  ? History of gastroesophageal reflux (GERD) 10/30/2016  ? High risk medication use 10/30/2016  ? Psoriasis 05/02/2016  ? Bilateral low back pain with sciatica 05/02/2016  ? Osteopenia 05/02/2016  ?  ?Past Medical History:  ?Diagnosis Date  ? Arthritis   ? GERD (gastroesophageal reflux disease)   ? Hyperlipidemia   ? Hypertension   ? Migraine   ? Osteoporosis   ?  ?Family History  ?Problem Relation Age of Onset  ? Leukemia Mother   ? Stroke Father   ? Hypertension Sister   ? Hypertension Brother   ? Stroke Brother   ? ?Past Surgical History:  ?Procedure Laterality Date  ? ABDOMINAL HYSTERECTOMY    ? CARPAL TUNNEL RELEASE    ? CERVICAL FUSION    ? EYE SURGERY    ?  ingrown toenail removal    ? KNEE ARTHROSCOPY Right   ? KNEE CARTILAGE SURGERY    ? ROTATOR CUFF REPAIR    ? SPINAL FUSION    ? cervical fusion  ? ?Social History  ? ?Social History Narrative  ? Not on file  ? ?Immunization History  ?Administered Date(s) Administered  ? Influenza Split 02/15/2010  ? Influenza, High Dose Seasonal PF 02/06/2015, 01/23/2016, 01/20/2017, 02/20/2018  ? Influenza, Seasonal, Injecte, Preservative Fre 02/11/2011, 01/04/2013, 04/05/2014  ? Influenza,trivalent, recombinat, inj, PF  01/31/2012  ? PFIZER(Purple Top)SARS-COV-2 Vaccination 07/05/2019, 07/26/2019, 12/17/2019, 07/04/2020, 03/07/2021  ? [employer], [employer] 72yrs & up 03/07/2021  ? Pneumococcal Conjugate-13 08/11/2014  ? Pneumococcal Polysaccharide-23 05/29/2009, 08/29/2015  ? Td 07/06/2018  ? Tdap 12/17/2007  ? Zoster Recombinat (Shingrix) 02/27/2018, 06/12/2018  ?  ? ?Objective: ?Vital Signs: BP 120/64 (BP Location: Left Arm, Patient Position: Sitting, Cuff Size: Normal)   Pulse 61   Ht 5' (1.524 m)   Wt 101 lb 3.2 oz (45.9 kg)   BMI 19.76 kg/m?   ? ?Physical Exam ?Vitals and nursing note reviewed.  ?Constitutional:   ?   Appearance: She is well-developed.  ?HENT:  ?   Head: Normocephalic and atraumatic.  ?Eyes:  ?   Conjunctiva/sclera: Conjunctivae normal.  ?Cardiovascular:  ?   Rate and Rhythm: Normal rate and regular rhythm.  ?   Heart sounds: Normal heart sounds.  ?Pulmonary:  ?   Effort: Pulmonary effort is normal.  ?   Breath sounds: Normal breath sounds.  ?Abdominal:  ?   General: Bowel sounds are normal.  ?   Palpations: Abdomen is soft.  ?Musculoskeletal:  ?   Cervical back: Normal range of motion.  ?Lymphadenopathy:  ?   Cervical: No cervical adenopathy.  ?Skin: ?   General: Skin is warm and dry.  ?   Capillary Refill: Capillary refill takes less than 2 seconds.  ?Neurological:  ?   Mental Status: She is alert and oriented to person, place, and time.  ?Psychiatric:     ?   Behavior: Behavior normal.  ?  ? ?Musculoskeletal Exam: She had limited lateral rotation of her cervical spine.  She had painful range of motion of her lumbar spine.  Shoulder joints, elbow joints, wrist joints in good range of motion.  She had bilateral PIP and DIP thickening with subluxation of several of her PIP and DIP joints without synovitis.  Hip joints with good range of motion.  She had discomfort range of motion of her left hip joint.  She also had discomfort in the mid thigh.  Knee joints with good range of motion.   There was no tenderness over ankles or MTPs. ? ?CDAI Exam: ?CDAI Score: -- ?Patient Global: --; Provider Global: -- ?Swollen: --; Tender: -- ?Joint Exam 07/25/2021  ? ?No joint exam has been documented for this visit  ? ?There is currently no information documented on the homunculus. Go to the Rheumatology activity and complete the homunculus joint exam. ? ?Investigation: ?No additional findings. ? ?Imaging: ?No results found. ? ?Recent Labs: ?Lab Results  ?Component Value Date  ? WBC 12.0 (H) 07/04/2021  ? HGB 12.9 07/04/2021  ? PLT 341 07/04/2021  ? NA 133 (L) 07/04/2021  ? K 4.4 07/04/2021  ? CL 101 07/04/2021  ? CO2 23 07/04/2021  ? GLUCOSE 86 07/04/2021  ? BUN 21 07/04/2021  ? CREATININE 0.53 07/04/2021  ? BILITOT 0.6 07/04/2021  ? ALKPHOS 69 07/04/2021  ? AST  24 07/04/2021  ? ALT 14 07/04/2021  ? PROT 6.8 07/04/2021  ? ALBUMIN 3.5 07/04/2021  ? CALCIUM 8.7 (L) 07/04/2021  ? GFRAA >60 12/09/2019  ? QFTBGOLDPLUS Negative 09/26/2020  ? ? ?Speciality Comments: [name] Aria infusions every 8 weeks, started on 11/2016 TB gold negative 08/01/16 ? ?Procedures:  ?No procedures performed ?Allergies: Codeine and Iodine  ? ?Assessment / Plan:     ?Visit Diagnoses: Psoriatic arthropathy (HCC)-she had no active synovitis.  She had bilateral PIP and DIP thickening with subluxation of her joints.  Her symptoms are well controlled on [name] Aria infusions and methotrexate combination. ? ?Psoriasis-she had no active psoriasis lesions. ? ?High risk medication use - [name] Aria IV 2 mg/kg every 8 weeks, methotrexate 6 tablets by mouth every 7 days, and folic acid 1 mg 2 tablets daily.  TB gold was negative on September 26, 2020.  Labs from July 04, 2021 were reviewed.  White cell count was elevated due to cortisone injection.  Low calcium was noted.  She was advised to take calcium supplement.  Information regarding immunization was placed in the AVS.  She was advised to hold methotrexate and [name] Aria infusions in case she  develops an infection.  Annual skin examination to screen for skin cancer was advised. ? ?Primary osteoarthritis of both hands-she has severe osteoarthritis and psoriatic arthritis overlap.  Joint protection was discus

## 2021-07-25 ENCOUNTER — Ambulatory Visit (INDEPENDENT_AMBULATORY_CARE_PROVIDER_SITE_OTHER): Payer: Medicare (Managed Care)

## 2021-07-25 ENCOUNTER — Encounter: Payer: Self-pay | Admitting: Rheumatology

## 2021-07-25 ENCOUNTER — Ambulatory Visit (INDEPENDENT_AMBULATORY_CARE_PROVIDER_SITE_OTHER): Payer: Medicare (Managed Care) | Admitting: Rheumatology

## 2021-07-25 VITALS — BP 120/64 | HR 61 | Ht 60.0 in | Wt 101.2 lb

## 2021-07-25 DIAGNOSIS — Z8719 Personal history of other diseases of the digestive system: Secondary | ICD-10-CM

## 2021-07-25 DIAGNOSIS — Z8669 Personal history of other diseases of the nervous system and sense organs: Secondary | ICD-10-CM

## 2021-07-25 DIAGNOSIS — L4050 Arthropathic psoriasis, unspecified: Secondary | ICD-10-CM | POA: Diagnosis not present

## 2021-07-25 DIAGNOSIS — L409 Psoriasis, unspecified: Secondary | ICD-10-CM

## 2021-07-25 DIAGNOSIS — M5136 Other intervertebral disc degeneration, lumbar region: Secondary | ICD-10-CM

## 2021-07-25 DIAGNOSIS — I7300 Raynaud's syndrome without gangrene: Secondary | ICD-10-CM

## 2021-07-25 DIAGNOSIS — M25552 Pain in left hip: Secondary | ICD-10-CM

## 2021-07-25 DIAGNOSIS — Z79899 Other long term (current) drug therapy: Secondary | ICD-10-CM

## 2021-07-25 DIAGNOSIS — M19041 Primary osteoarthritis, right hand: Secondary | ICD-10-CM

## 2021-07-25 DIAGNOSIS — M51369 Other intervertebral disc degeneration, lumbar region without mention of lumbar back pain or lower extremity pain: Secondary | ICD-10-CM

## 2021-07-25 DIAGNOSIS — M898X5 Other specified disorders of bone, thigh: Secondary | ICD-10-CM

## 2021-07-25 DIAGNOSIS — M5030 Other cervical disc degeneration, unspecified cervical region: Secondary | ICD-10-CM

## 2021-07-25 DIAGNOSIS — M8589 Other specified disorders of bone density and structure, multiple sites: Secondary | ICD-10-CM

## 2021-07-25 DIAGNOSIS — I10 Essential (primary) hypertension: Secondary | ICD-10-CM

## 2021-07-25 DIAGNOSIS — M19042 Primary osteoarthritis, left hand: Secondary | ICD-10-CM

## 2021-07-25 NOTE — Patient Instructions (Addendum)
Vaccines ?You are taking a medication(s) that can suppress your immune system.  The following immunizations are recommended: ?Flu annually ?Covid-19  ?Td/Tdap (tetanus, diphtheria, pertussis) every 10 years ?Pneumonia (Prevnar 15 then Pneumovax 23 at least 1 year apart.  Alternatively, can take Prevnar 20 without needing additional dose) ?Shingrix: 2 doses from 4 weeks to 6 months apart ? ?Please check with your PCP to make sure you are up to date.  ?If you have signs or symptoms of an infection or start antibiotics: ?First, call your PCP for workup of your infection. ?Hold your medication through the infection, until you complete your antibiotics, and until symptoms resolve if you take the following: ?Injectable medication (Actemra, Benlysta, Cimzia, Cosentyx, Enbrel, Humira, Kevzara, Orencia, Remicade, [name], [place], [place], [place]) ?Methotrexate ?Leflunomide Ranae Plumber) ?Mycophenolate (Cellcept) ?[place], or Rinvoq  ?Please get annual skin examination to screen for skin cancer while you are on [name] Aria infusions. ?Back Exercises ?The following exercises strengthen the muscles that help to support the trunk (torso) and back. They also help to keep the lower back flexible. Doing these exercises can help to prevent or lessen existing low back pain. ?If you have back pain or discomfort, try doing these exercises 2-3 times each day or as told by your health care provider. ?As your pain improves, do them once each day, but increase the number of times that you repeat the steps for each exercise (do more repetitions). ?To prevent the recurrence of back pain, continue to do these exercises once each day or as told by your health care provider. ?Do exercises exactly as told by your health care provider and adjust them as directed. It is normal to feel mild stretching, pulling, tightness, or discomfort as you do these exercises, but you should stop right away if you feel sudden pain or your pain gets  worse. ?Exercises ?Single knee to chest ?Repeat these steps 3-5 times for each leg: ?Lie on your back on a firm bed or the floor with your legs extended. ?Bring one knee to your chest. Your other leg should stay extended and in contact with the floor. ?Hold your knee in place by grabbing your knee or thigh with both hands and hold. ?Pull on your knee until you feel a gentle stretch in your lower back or buttocks. ?Hold the stretch for 10-30 seconds. ?Slowly release and straighten your leg. ? ?Pelvic tilt ?Repeat these steps 5-10 times: ?Lie on your back on a firm bed or the floor with your legs extended. ?Bend your knees so they are pointing toward the ceiling and your feet are flat on the floor. ?Tighten your lower abdominal muscles to press your lower back against the floor. This motion will tilt your pelvis so your tailbone points up toward the ceiling instead of pointing to your feet or the floor. ?With gentle tension and even breathing, hold this position for 5-10 seconds. ? ?Cat-cow ?Repeat these steps until your lower back becomes more flexible: ?Get into a hands-and-knees position on a firm bed or the floor. Keep your hands under your shoulders, and keep your knees under your hips. You may place padding under your knees for comfort. ?Let your head hang down toward your chest. Contract your abdominal muscles and point your tailbone toward the floor so your lower back becomes rounded like the back of a cat. ?Hold this position for 5 seconds. ?Slowly lift your head, let your abdominal muscles relax, and point your tailbone up toward the ceiling so your back forms  a sagging arch like the back of a cow. ?Hold this position for 5 seconds. ? ?Press-ups ?Repeat these steps 5-10 times: ?Lie on your abdomen (face-down) on a firm bed or the floor. ?Place your palms near your head, about shoulder-width apart. ?Keeping your back as relaxed as possible and keeping your hips on the floor, slowly straighten your arms to  raise the top half of your body and lift your shoulders. Do not use your back muscles to raise your upper torso. You may adjust the placement of your hands to make yourself more comfortable. ?Hold this position for 5 seconds while you keep your back relaxed. ?Slowly return to lying flat on the floor. ? ?Bridges ?Repeat these steps 10 times: ?Lie on your back on a firm bed or the floor. ?Bend your knees so they are pointing toward the ceiling and your feet are flat on the floor. Your arms should be flat at your sides, next to your body. ?Tighten your buttocks muscles and lift your buttocks off the floor until your waist is at almost the same height as your knees. You should feel the muscles working in your buttocks and the back of your thighs. If you do not feel these muscles, slide your feet 1-2 inches (2.5-5 cm) farther away from your buttocks. ?Hold this position for 3-5 seconds. ?Slowly lower your hips to the starting position, and allow your buttocks muscles to relax completely. ?If this exercise is too easy, try doing it with your arms crossed over your chest. ?Abdominal crunches ?Repeat these steps 5-10 times: ?Lie on your back on a firm bed or the floor with your legs extended. ?Bend your knees so they are pointing toward the ceiling and your feet are flat on the floor. ?Cross your arms over your chest. ?Tip your chin slightly toward your chest without bending your neck. ?Tighten your abdominal muscles and slowly raise your torso high enough to lift your shoulder blades a tiny bit off the floor. Avoid raising your torso higher than that because it can put too much stress on your lower back and does not help to strengthen your abdominal muscles. ?Slowly return to your starting position. ? ?Back lifts ?Repeat these steps 5-10 times: ?Lie on your abdomen (face-down) with your arms at your sides, and rest your forehead on the floor. ?Tighten the muscles in your legs and your buttocks. ?Slowly lift your chest off  the floor while you keep your hips pressed to the floor. Keep the back of your head in line with the curve in your back. Your eyes should be looking at the floor. ?Hold this position for 3-5 seconds. ?Slowly return to your starting position. ? ?Contact a health care provider if: ?Your back pain or discomfort gets much worse when you do an exercise. ?Your worsening back pain or discomfort does not lessen within 2 hours after you exercise. ?If you have any of these problems, stop doing these exercises right away. Do not do them again unless your health care provider says that you can. ?Get help right away if: ?You develop sudden, severe back pain. If this happens, stop doing the exercises right away. Do not do them again unless your health care provider says that you can. ?This information is not intended to replace advice given to you by your health care provider. Make sure you discuss any questions you have with your health care provider. ?Document Revised: 10/03/2020 Document Reviewed: 06/21/2020 ?Elsevier Patient Education ? 2022 [employer] ? ?

## 2021-08-28 ENCOUNTER — Other Ambulatory Visit: Payer: Self-pay | Admitting: *Deleted

## 2021-08-28 MED ORDER — METHOTREXATE 2.5 MG PO TABS: 15.0000 mg | ORAL_TABLET | ORAL | 0 refills | Status: DC

## 2021-08-28 NOTE — Telephone Encounter (Signed)
Next Visit: 12/27/2021 ? ?Last Visit: 07/25/2021 ? ?Last Fill: 06/06/2021 ? ?DX:  Psoriatic arthropathy  ? ?Current Dose per office note 07/25/2021: methotrexate 6 tablets by mouth every 7 days ? ?Labs: 06/26/2021 WBC count is slightly elevated.  Absolute neutrophils are elevated Rest of CBC with diff WNL. Calcium is borderline low Sodium remains borderline low.  Rest of CMP WNL.  ? ?Okay to refill MTX?  ?

## 2021-08-29 ENCOUNTER — Ambulatory Visit: Payer: Medicare PPO | Admitting: Rheumatology

## 2021-08-29 ENCOUNTER — Encounter (HOSPITAL_COMMUNITY): Payer: Medicare (Managed Care)

## 2021-09-07 NOTE — Progress Notes (Signed)
Order placed for TB gold to be drawn with upcoming infusion on 09/10/21  Chesley Mires, PharmD, MPH, BCPS, CPP Clinical Pharmacist (Rheumatology and Pulmonology)

## 2021-09-07 NOTE — Addendum Note (Signed)
Addended by: Murrell Redden on: 09/07/2021 11:08 AM   Modules accepted: Orders

## 2021-09-10 ENCOUNTER — Ambulatory Visit (HOSPITAL_COMMUNITY)
Admission: RE | Admit: 2021-09-10 | Discharge: 2021-09-10 | Disposition: A | Discharge status: Home / Self Care | Payer: Medicare (Managed Care) | Source: Ambulatory Visit | Attending: Internal Medicine | Admitting: Internal Medicine

## 2021-09-10 DIAGNOSIS — Z79899 Other long term (current) drug therapy: Secondary | ICD-10-CM | POA: Diagnosis not present

## 2021-09-10 DIAGNOSIS — L4050 Arthropathic psoriasis, unspecified: Secondary | ICD-10-CM | POA: Insufficient documentation

## 2021-09-10 DIAGNOSIS — Z111 Encounter for screening for respiratory tuberculosis: Secondary | ICD-10-CM | POA: Insufficient documentation

## 2021-09-10 DIAGNOSIS — L409 Psoriasis, unspecified: Secondary | ICD-10-CM | POA: Diagnosis present

## 2021-09-10 LAB — COMPREHENSIVE METABOLIC PANEL
ALT: 17 U/L (ref 0–44)
AST: 21 U/L (ref 15–41)
Albumin: 3.4 g/dL — ABNORMAL LOW (ref 3.5–5.0)
Alkaline Phosphatase: 80 U/L (ref 38–126)
Anion gap: 6 (ref 5–15)
BUN: 23 mg/dL (ref 8–23)
CO2: 24 mmol/L (ref 22–32)
Calcium: 9 mg/dL (ref 8.9–10.3)
Chloride: 106 mmol/L (ref 98–111)
Creatinine, Ser: 0.5 mg/dL (ref 0.44–1.00)
GFR, Estimated: >60 mL/min (ref >60)
Glucose, Bld: 96 mg/dL (ref 70–99)
Potassium: 4.1 mmol/L (ref 3.5–5.1)
Sodium: 136 mmol/L (ref 135–145)
Total Bilirubin: 0.6 mg/dL (ref 0.3–1.2)
Total Protein: 6.4 g/dL — ABNORMAL LOW (ref 6.5–8.1)

## 2021-09-10 LAB — CBC WITH DIFFERENTIAL/PLATELET
Abs Immature Granulocytes: 0.02 10*3/uL (ref 0.00–0.07)
Basophils Absolute: 0.1 10*3/uL (ref 0.0–0.1)
Basophils Relative: 1 %
Eosinophils Absolute: 0.2 10*3/uL (ref 0.0–0.5)
Eosinophils Relative: 2 %
HCT: 39.8 % (ref 36.0–46.0)
Hemoglobin: 12.9 g/dL (ref 12.0–15.0)
Immature Granulocytes: 0 %
Lymphocytes Relative: 25 %
Lymphs Abs: 1.9 10*3/uL (ref 0.7–4.0)
MCH: 33.1 pg (ref 26.0–34.0)
MCHC: 32.4 g/dL (ref 30.0–36.0)
MCV: 102.1 fL — ABNORMAL HIGH (ref 80.0–100.0)
Monocytes Absolute: 0.9 10*3/uL (ref 0.1–1.0)
Monocytes Relative: 12 %
Neutro Abs: 4.6 10*3/uL (ref 1.7–7.7)
Neutrophils Relative %: 60 %
Platelets: 338 10*3/uL (ref 150–400)
RBC: 3.9 MIL/uL (ref 3.87–5.11)
RDW: 13.2 % (ref 11.5–15.5)
WBC: 7.6 10*3/uL (ref 4.0–10.5)
nRBC: 0 % (ref 0.0–0.2)

## 2021-09-10 MED ORDER — DIPHENHYDRAMINE HCL 25 MG PO CAPS: 25.0000 mg | ORAL_CAPSULE | ORAL | Status: DC

## 2021-09-10 MED ORDER — ACETAMINOPHEN 325 MG PO TABS: 650.0000 mg | ORAL_TABLET | ORAL | Status: DC

## 2021-09-10 MED ORDER — SODIUM CHLORIDE 0.9 % IV SOLN
2.0000 mg/kg | INTRAVENOUS | Status: DC
  Administered 2021-09-10: 91.25 mg via INTRAVENOUS
  Filled 2021-09-10: qty 7.3

## 2021-09-13 LAB — QUANTIFERON-TB GOLD PLUS: QuantiFERON-TB Gold Plus: NEGATIVE

## 2021-09-13 LAB — QUANTIFERON-TB GOLD PLUS (RQFGPL)
QuantiFERON Mitogen Value: >10 IU/mL
QuantiFERON Nil Value: 0.07 IU/mL
QuantiFERON TB1 Ag Value: 0.05 IU/mL
QuantiFERON TB2 Ag Value: 0.05 IU/mL

## 2021-09-14 NOTE — Progress Notes (Signed)
TB gold negative

## 2021-09-18 ENCOUNTER — Other Ambulatory Visit: Payer: Self-pay | Admitting: Rheumatology

## 2021-09-18 NOTE — Telephone Encounter (Signed)
Next Visit: 12/27/2021   Last Visit: 07/25/2021   Last Fill: 09/21/2020  DX:  Psoriatic arthropathy    Current Dose per office note 07/25/2021: folic acid 1 mg 2 tablets daily.   Okay to refill Folic Acid?

## 2021-11-05 ENCOUNTER — Encounter (HOSPITAL_COMMUNITY): Payer: Medicare (Managed Care)

## 2021-11-06 ENCOUNTER — Other Ambulatory Visit: Payer: Self-pay | Admitting: Rheumatology

## 2021-11-06 NOTE — Telephone Encounter (Signed)
Next Visit: 12/27/2021   Last Visit: 07/25/2021   Last Fill: 03/28/2022  Okay to refill Lidocaine patches?

## 2021-11-08 ENCOUNTER — Other Ambulatory Visit: Payer: Self-pay | Admitting: Pharmacist

## 2021-11-08 DIAGNOSIS — L409 Psoriasis, unspecified: Secondary | ICD-10-CM

## 2021-11-08 DIAGNOSIS — L4050 Arthropathic psoriasis, unspecified: Secondary | ICD-10-CM

## 2021-11-08 DIAGNOSIS — Z79899 Other long term (current) drug therapy: Secondary | ICD-10-CM

## 2021-11-08 NOTE — Progress Notes (Signed)
Next infusion scheduled for [name] Aria on 11/09/21 and due for updated orders. Diagnosis: PsA/psoriasis  Dose: 2mg /kg every 8 weeks (91.25mg  based on last recorded weight of 45.8kg)  Last Clinic Visit: 07/25/21 Next Clinic Visit: 12/27/21  Last infusion: 09/10/21  Labs: CBC and CMP on 09/10/21 TB Gold: negative on 09/10/21   Orders placed for [name] Aria x 2 doses along with premedication of acetaminophen and diphenhydramine to be administered 30 minutes before medication infusion.  Standing CBC with diff/platelet and CMP with GFR orders placed to be drawn every 2 months.  Next TB gold due 09/11/22  09/13/22, PharmD, MPH, BCPS, CPP Clinical Pharmacist (Rheumatology and Pulmonology)

## 2021-11-09 ENCOUNTER — Ambulatory Visit (HOSPITAL_COMMUNITY)
Admission: RE | Admit: 2021-11-09 | Discharge: 2021-11-09 | Disposition: A | Discharge status: Home / Self Care | Payer: Medicare (Managed Care) | Source: Ambulatory Visit | Attending: Internal Medicine | Admitting: Internal Medicine

## 2021-11-09 ENCOUNTER — Encounter: Payer: Self-pay | Admitting: Rheumatology

## 2021-11-09 DIAGNOSIS — Z79899 Other long term (current) drug therapy: Secondary | ICD-10-CM | POA: Diagnosis present

## 2021-11-09 DIAGNOSIS — L4050 Arthropathic psoriasis, unspecified: Secondary | ICD-10-CM | POA: Diagnosis present

## 2021-11-09 DIAGNOSIS — L409 Psoriasis, unspecified: Secondary | ICD-10-CM

## 2021-11-09 LAB — COMPREHENSIVE METABOLIC PANEL
ALT: 21 U/L (ref 0–44)
AST: 28 U/L (ref 15–41)
Albumin: 3.6 g/dL (ref 3.5–5.0)
Alkaline Phosphatase: 63 U/L (ref 38–126)
Anion gap: 9 (ref 5–15)
BUN: 20 mg/dL (ref 8–23)
CO2: 24 mmol/L (ref 22–32)
Calcium: 9.1 mg/dL (ref 8.9–10.3)
Chloride: 102 mmol/L (ref 98–111)
Creatinine, Ser: 0.54 mg/dL (ref 0.44–1.00)
GFR, Estimated: >60 mL/min (ref >60)
Glucose, Bld: 97 mg/dL (ref 70–99)
Potassium: 4.5 mmol/L (ref 3.5–5.1)
Sodium: 135 mmol/L (ref 135–145)
Total Bilirubin: 0.9 mg/dL (ref 0.3–1.2)
Total Protein: 6.7 g/dL (ref 6.5–8.1)

## 2021-11-09 LAB — CBC WITH DIFFERENTIAL/PLATELET
Abs Immature Granulocytes: 0.01 10*3/uL (ref 0.00–0.07)
Basophils Absolute: 0 10*3/uL (ref 0.0–0.1)
Basophils Relative: 1 %
Eosinophils Absolute: 0.1 10*3/uL (ref 0.0–0.5)
Eosinophils Relative: 1 %
HCT: 42 % (ref 36.0–46.0)
Hemoglobin: 14.2 g/dL (ref 12.0–15.0)
Immature Granulocytes: 0 %
Lymphocytes Relative: 30 %
Lymphs Abs: 2.1 10*3/uL (ref 0.7–4.0)
MCH: 32.9 pg (ref 26.0–34.0)
MCHC: 33.8 g/dL (ref 30.0–36.0)
MCV: 97.4 fL (ref 80.0–100.0)
Monocytes Absolute: 0.8 10*3/uL (ref 0.1–1.0)
Monocytes Relative: 11 %
Neutro Abs: 4 10*3/uL (ref 1.7–7.7)
Neutrophils Relative %: 57 %
Platelets: 327 10*3/uL (ref 150–400)
RBC: 4.31 MIL/uL (ref 3.87–5.11)
RDW: 12.6 % (ref 11.5–15.5)
WBC: 7 10*3/uL (ref 4.0–10.5)
nRBC: 0 % (ref 0.0–0.2)

## 2021-11-09 MED ORDER — DIPHENHYDRAMINE HCL 25 MG PO CAPS: 25.0000 mg | ORAL_CAPSULE | ORAL | Status: DC

## 2021-11-09 MED ORDER — SODIUM CHLORIDE 0.9 % IV SOLN
2.0000 mg/kg | INTRAVENOUS | Status: DC
  Administered 2021-11-09: 91.25 mg via INTRAVENOUS
  Filled 2021-11-09: qty 7.3

## 2021-11-09 MED ORDER — ACETAMINOPHEN 325 MG PO TABS: 650.0000 mg | ORAL_TABLET | ORAL | Status: DC

## 2021-11-09 NOTE — Progress Notes (Signed)
CMP normal

## 2021-11-09 NOTE — Progress Notes (Signed)
CBC normal

## 2021-12-05 ENCOUNTER — Other Ambulatory Visit: Payer: Self-pay | Admitting: *Deleted

## 2021-12-05 MED ORDER — METHOTREXATE 2.5 MG PO TABS
15.0000 mg | ORAL_TABLET | ORAL | 0 refills | Status: DC
Start: 2021-12-05 — End: 2022-03-06

## 2021-12-05 NOTE — Telephone Encounter (Signed)
Next Visit: 12/27/2021   Last Visit: 07/25/2021   Last Fill: 08/28/2021  DX:  Psoriatic arthropathy    Current Dose per office note 07/25/2021: methotrexate 6 tablets by mouth every 7 days  Labs: 11/09/2021 CMP normal CBC normal  Okay to refill MTX?

## 2021-12-07 ENCOUNTER — Encounter (HOSPITAL_COMMUNITY): Payer: Medicare (Managed Care)

## 2021-12-14 NOTE — Progress Notes (Signed)
Office Visit Note  Patient: Amy Hayes             Date of Birth: 24-Nov-1945           MRN: [id number]             PCP: Milda Smart, MD Referring: Milda Smart, MD Visit Date: 12/27/2021 Occupation: @GUAROCC @  Subjective:  Medication management  History of Present Illness: Amy Hayes is a 76 y.o. female with history of psoriatic arthritis, osteoarthritis and degenerative disc disease.  She states she has been tolerating [name] Aria infusions every 8 weeks without any side effects.  She also takes methotrexate 6 tablets every week.  She has not noticed any increased joint swelling.  She has noticed few knots on her left hand.  She continues to have pain and stiffness in her lumbar spine.  She continues to have pain radiating to her left hip.  She denies any psoriasis lesions.  Activities of Daily Living:  Patient reports morning stiffness for all day. Patient Denies nocturnal pain.  Difficulty dressing/grooming: Denies Difficulty climbing stairs: Reports Difficulty getting out of chair: Denies Difficulty using hands for taps, buttons, cutlery, and/or writing: Reports  Review of Systems  Constitutional:  Negative for fatigue.  HENT:  Negative for mouth sores and mouth dryness.   Eyes:  Positive for dryness.  Respiratory:  Negative for shortness of breath.   Cardiovascular:  Negative for chest pain and palpitations.  Gastrointestinal:  Negative for blood in stool, constipation and diarrhea.  Endocrine: Negative for increased urination.  Genitourinary:  Negative for involuntary urination.  Musculoskeletal:  Positive for joint pain, joint pain and morning stiffness. Negative for gait problem, joint swelling, myalgias, muscle weakness, muscle tenderness and myalgias.  Skin:  Negative for color change, rash, hair loss and sensitivity to sunlight.  Allergic/Immunologic: Negative for susceptible to infections.  Neurological:  Negative for dizziness and headaches.   Hematological:  Negative for swollen glands.  Psychiatric/Behavioral:  Negative for depressed mood and sleep disturbance. The patient is not nervous/anxious.     PMFS History:  Patient Active Problem List   Diagnosis Date Noted   Essential hypertension 12/23/2019   Pain in both lower extremities 12/23/2019   Psoriatic arthropathy (HCC) 12/22/2018   DDD (degenerative disc disease), lumbar 07/24/2017   Primary osteoarthritis of both hands 03/04/2017   DDD (degenerative disc disease), cervical s/p Fusion  10/30/2016   Raynaud's disease without gangrene 10/30/2016   History of migraine 10/30/2016   History of gastroesophageal reflux (GERD) 10/30/2016   High risk medication use 10/30/2016   Psoriasis 05/02/2016   Bilateral low back pain with sciatica 05/02/2016   Osteopenia 05/02/2016    Past Medical History:  Diagnosis Date   Arthritis    GERD (gastroesophageal reflux disease)    Hyperlipidemia    Hypertension    Migraine    Osteoporosis     Family History  Problem Relation Age of Onset   Leukemia Mother    Stroke Father    Hypertension Sister    Hypertension Brother    Stroke Brother    Past Surgical History:  Procedure Laterality Date   ABDOMINAL HYSTERECTOMY     CARPAL TUNNEL RELEASE     CERVICAL FUSION     EYE SURGERY     ingrown toenail removal     KNEE ARTHROSCOPY Right    KNEE CARTILAGE SURGERY     ROTATOR CUFF REPAIR     SPINAL FUSION  cervical fusion   Social History   Social History Narrative   Not on file   Immunization History  Administered Date(s) Administered   Influenza Split 02/15/2010   Influenza, High Dose Seasonal PF 02/06/2015, 01/23/2016, 01/20/2017, 02/20/2018   Influenza, Seasonal, Injecte, Preservative Fre 02/11/2011, 01/04/2013, 04/05/2014   Influenza,trivalent, recombinat, inj, PF 01/31/2012   PFIZER(Purple Top)SARS-COV-2 Vaccination 07/05/2019, 07/26/2019, 12/17/2019, 07/04/2020, 03/07/2021   Pfizer Covid-19 Vaccine Bivalent  Booster 86yrs & up 03/07/2021   Pneumococcal Conjugate-13 08/11/2014   Pneumococcal Polysaccharide-23 05/29/2009, 08/29/2015   Td 07/06/2018   Tdap 12/17/2007   Zoster Recombinat (Shingrix) 02/27/2018, 06/12/2018     Objective: Vital Signs: BP 124/68 (BP Location: Left Arm, Patient Position: Sitting, Cuff Size: Normal)   Pulse (!) 54   Resp 13   Ht 5' (1.524 m)   Wt 104 lb 6.4 oz (47.4 kg)   BMI 20.39 kg/m    Physical Exam Vitals and nursing note reviewed.  Constitutional:      Appearance: She is well-developed.  HENT:     Head: Normocephalic and atraumatic.  Eyes:     Conjunctiva/sclera: Conjunctivae normal.  Cardiovascular:     Rate and Rhythm: Normal rate and regular rhythm.     Heart sounds: Normal heart sounds.  Pulmonary:     Effort: Pulmonary effort is normal.     Breath sounds: Normal breath sounds.  Abdominal:     General: Bowel sounds are normal.     Palpations: Abdomen is soft.  Musculoskeletal:     Cervical back: Normal range of motion.  Lymphadenopathy:     Cervical: No cervical adenopathy.  Skin:    General: Skin is warm and dry.     Capillary Refill: Capillary refill takes less than 2 seconds.  Neurological:     Mental Status: She is alert and oriented to person, place, and time.  Psychiatric:        Behavior: Behavior normal.      Musculoskeletal Exam: She had limited range of motion of the cervical spine.  Thoracic kyphosis was noted.  She had limited painful range of motion of the lumbar spine.  Shoulder joints in good range of motion.  Elbow joints and wrist joints with good range of motion.  She had a ganglion cyst on the volar aspect of her left wrist.  There was bilateral PIP and DIP thickening with no synovitis.  Hip joints were in good range of motion.  Knee joints were in good range of motion.  There was no tenderness over ankles or MTPs.  CDAI Exam: CDAI Score: -- Patient Global: --; Provider Global: -- Swollen: --; Tender: -- Joint Exam  12/27/2021   No joint exam has been documented for this visit   There is currently no information documented on the homunculus. Go to the Rheumatology activity and complete the homunculus joint exam.  Investigation: No additional findings.  Imaging: No results found.  Recent Labs: Lab Results  Component Value Date   WBC 7.0 11/09/2021   HGB 14.2 11/09/2021   PLT 327 11/09/2021   NA 135 11/09/2021   K 4.5 11/09/2021   CL 102 11/09/2021   CO2 24 11/09/2021   GLUCOSE 97 11/09/2021   BUN 20 11/09/2021   CREATININE 0.54 11/09/2021   BILITOT 0.9 11/09/2021   ALKPHOS 63 11/09/2021   AST 28 11/09/2021   ALT 21 11/09/2021   PROT 6.7 11/09/2021   ALBUMIN 3.6 11/09/2021   CALCIUM 9.1 11/09/2021   GFRAA >60 12/09/2019  QFTBGOLDPLUS Negative 09/10/2021    Speciality Comments: [name] Aria infusions every 8 weeks, started on 11/2016 TB gold negative 08/01/16  Procedures:  No procedures performed Allergies: Codeine   Assessment / Plan:     Visit Diagnoses: Psoriatic arthropathy (HCC)-she had no synovitis on examination.  She has been on [name] Aria infusions every 8 weeks and methotrexate 6 tablets every 7 days without any side effects.  He denies any history of joint swelling.  She continues to have some stiffness.  Psoriasis-she had no psoriasis lesions on the examination today.  High risk medication use - [name] Aria IV 2 mg/kg every 8 weeks, methotrexate 6 tablets by mouth every 7 days, and folic acid 1 mg 2 tablets daily.  Labs obtained on November 09, 2021 CBC with differential and CMP with GFR were normal.  TB Gold on Sep 10, 2021 was negative.  She will be getting labs with her infusions.  TB Gold will be annually.  She has been getting annual skin examination to screen for skin cancer.  Information about immunization was placed in the AVS.  Her last [name] infusion was on November 09, 2021.  Primary osteoarthritis of both hands-she has bilateral PIP and DIP thickening.  A  ganglion cyst was noted on the left wrist volar aspect.  Observation was advised.  Pain in left hip -she continues to have some stiffness and discomfort on range of motion of her left hip joint.  X-ray of the left hip joint was unremarkable  Pain of left femur - X-ray of the femur was unremarkable.    DDD (degenerative disc disease), cervical - s/p fusion.  She had better range of motion without discomfort.  DDD (degenerative disc disease), lumbar -she continues to have lower back pain.  She has intermittent left-sided radiculopathy.  She gets cortisone injection in her lumbar spine by spine center in [place]..  Core strengthening exercises were emphasized.  Raynaud's disease without gangrene-sent him some more prominent during the winter months.  Osteopenia of multiple sites - DEXA scan with her PCP. DEXA 09/14/2019.  Patient states she had Raynauds and DEXA scan which was normal.  I do not have the results available.  History of gastroesophageal reflux (GERD)  History of migraine  Essential hypertension-blood pressure  Orders: No orders of the defined types were placed in this encounter.  No orders of the defined types were placed in this encounter.    Follow-Up Instructions: Return in about 5 months (around 05/29/2022) for Psoriatic arthritis, Osteoarthritis.   Pollyann Savoy, MD  Note - This record has been created using Animal nutritionist.  Chart creation errors have been sought, but may not always  have been located. Such creation errors do not reflect on  the standard of medical care.

## 2021-12-27 ENCOUNTER — Encounter: Payer: Self-pay | Admitting: Rheumatology

## 2021-12-27 ENCOUNTER — Ambulatory Visit: Payer: Medicare (Managed Care) | Attending: Rheumatology | Admitting: Rheumatology

## 2021-12-27 VITALS — BP 124/68 | HR 54 | Resp 13 | Ht 60.0 in | Wt 104.4 lb

## 2021-12-27 DIAGNOSIS — M19041 Primary osteoarthritis, right hand: Secondary | ICD-10-CM

## 2021-12-27 DIAGNOSIS — M25552 Pain in left hip: Secondary | ICD-10-CM

## 2021-12-27 DIAGNOSIS — M5030 Other cervical disc degeneration, unspecified cervical region: Secondary | ICD-10-CM

## 2021-12-27 DIAGNOSIS — I10 Essential (primary) hypertension: Secondary | ICD-10-CM

## 2021-12-27 DIAGNOSIS — Z79899 Other long term (current) drug therapy: Secondary | ICD-10-CM

## 2021-12-27 DIAGNOSIS — Z8669 Personal history of other diseases of the nervous system and sense organs: Secondary | ICD-10-CM

## 2021-12-27 DIAGNOSIS — L409 Psoriasis, unspecified: Secondary | ICD-10-CM

## 2021-12-27 DIAGNOSIS — Z8719 Personal history of other diseases of the digestive system: Secondary | ICD-10-CM

## 2021-12-27 DIAGNOSIS — M19042 Primary osteoarthritis, left hand: Secondary | ICD-10-CM

## 2021-12-27 DIAGNOSIS — M898X5 Other specified disorders of bone, thigh: Secondary | ICD-10-CM

## 2021-12-27 DIAGNOSIS — M8589 Other specified disorders of bone density and structure, multiple sites: Secondary | ICD-10-CM

## 2021-12-27 DIAGNOSIS — M5136 Other intervertebral disc degeneration, lumbar region: Secondary | ICD-10-CM

## 2021-12-27 DIAGNOSIS — I7300 Raynaud's syndrome without gangrene: Secondary | ICD-10-CM

## 2021-12-27 DIAGNOSIS — L4050 Arthropathic psoriasis, unspecified: Secondary | ICD-10-CM | POA: Diagnosis not present

## 2021-12-27 DIAGNOSIS — M51369 Other intervertebral disc degeneration, lumbar region without mention of lumbar back pain or lower extremity pain: Secondary | ICD-10-CM

## 2021-12-27 NOTE — Patient Instructions (Signed)

## 2022-01-04 ENCOUNTER — Encounter: Payer: Self-pay | Admitting: Rheumatology

## 2022-01-04 NOTE — Telephone Encounter (Signed)
We can try injecting trochanteric bursa to see if  it will give her some relief.  Please schedule an appointment for trochanteric bursa injection.

## 2022-01-08 ENCOUNTER — Ambulatory Visit: Payer: Medicare (Managed Care) | Attending: Rheumatology | Admitting: Rheumatology

## 2022-01-08 VITALS — BP 155/69 | HR 63 | Resp 16 | Ht 60.0 in | Wt 105.0 lb

## 2022-01-08 DIAGNOSIS — M25552 Pain in left hip: Secondary | ICD-10-CM

## 2022-01-08 DIAGNOSIS — M7062 Trochanteric bursitis, left hip: Secondary | ICD-10-CM

## 2022-01-08 MED ORDER — TRIAMCINOLONE ACETONIDE 40 MG/ML IJ SUSP
40.0000 mg | INTRAMUSCULAR | Status: AC | PRN
  Administered 2022-01-08: 40 mg via INTRA_ARTICULAR

## 2022-01-08 MED ORDER — LIDOCAINE HCL 1 % IJ SOLN
1.5000 mL | INTRAMUSCULAR | Status: AC | PRN
  Administered 2022-01-08: 1.5 mL

## 2022-01-08 NOTE — Progress Notes (Signed)
   Procedure Note  Patient: Amy Hayes             Date of Birth: 21-Jan-1946           MRN: [id number]             Visit Date: 01/08/2022  Procedures: Visit Diagnoses: Trochanteric bursitis, left hip  Patient was seen last on December 27, 2021 with left hip pain.  The x-rays were unremarkable.  She continues to have discomfort over the left trochanteric bursa.  She had painful range of motion of her hip joint with discomfort over left trochanteric bursa.  Different treatment options and the side effects were discussed.  As she had ongoing pain and nocturnal pain we discussed the option of cortisone injection.  Patient wanted to proceed with a cortisone injection.  After indications Contraindications were discussed a left trochanteric bursa injection was performed which is described below.  Large Joint Inj: L greater trochanter on 01/08/2022 4:16 PM Indications: pain Details: 27 G 1.5 in needle, lateral approach  Arthrogram: No  Medications: 40 mg triamcinolone acetonide 40 MG/ML; 1.5 mL lidocaine 1 % Aspirate: 0 mL Outcome: tolerated well, no immediate complications Procedure, treatment alternatives, risks and benefits explained, specific risks discussed. Consent was given by the patient. Immediately prior to procedure a time out was called to verify the correct patient, procedure, equipment, support staff and site/side marked as required. Patient was prepped and draped in the usual sterile fashion.     Postprocedure instructions were given.  Bo Merino, MD

## 2022-01-11 ENCOUNTER — Inpatient Hospital Stay (HOSPITAL_COMMUNITY)
Admission: RE | Admit: 2022-01-11 | Payer: Medicare (Managed Care) | Source: Ambulatory Visit | Attending: Internal Medicine | Admitting: Internal Medicine

## 2022-01-15 ENCOUNTER — Ambulatory Visit (HOSPITAL_COMMUNITY)
Admission: RE | Admit: 2022-01-15 | Discharge: 2022-01-15 | Disposition: A | Discharge status: Home / Self Care | Payer: Medicare (Managed Care) | Source: Ambulatory Visit | Attending: Rheumatology | Admitting: Rheumatology

## 2022-01-15 ENCOUNTER — Other Ambulatory Visit: Payer: Self-pay | Admitting: Pharmacist

## 2022-01-15 DIAGNOSIS — Z79899 Other long term (current) drug therapy: Secondary | ICD-10-CM

## 2022-01-15 DIAGNOSIS — L4050 Arthropathic psoriasis, unspecified: Secondary | ICD-10-CM | POA: Insufficient documentation

## 2022-01-15 DIAGNOSIS — L409 Psoriasis, unspecified: Secondary | ICD-10-CM | POA: Diagnosis present

## 2022-01-15 LAB — COMPREHENSIVE METABOLIC PANEL
ALT: 12 U/L (ref 0–44)
AST: 50 U/L — ABNORMAL HIGH (ref 15–41)
Albumin: 4 g/dL (ref 3.5–5.0)
Alkaline Phosphatase: 65 U/L (ref 38–126)
Anion gap: 8 (ref 5–15)
BUN: 19 mg/dL (ref 8–23)
CO2: 27 mmol/L (ref 22–32)
Calcium: 9.3 mg/dL (ref 8.9–10.3)
Chloride: 100 mmol/L (ref 98–111)
Creatinine, Ser: 0.62 mg/dL (ref 0.44–1.00)
GFR, Estimated: >60 mL/min (ref >60)
Glucose, Bld: 91 mg/dL (ref 70–99)
Potassium: 5.7 mmol/L — ABNORMAL HIGH (ref 3.5–5.1)
Sodium: 135 mmol/L (ref 135–145)
Total Bilirubin: 1.3 mg/dL — ABNORMAL HIGH (ref 0.3–1.2)
Total Protein: 7 g/dL (ref 6.5–8.1)

## 2022-01-15 LAB — CBC WITH DIFFERENTIAL/PLATELET
Abs Immature Granulocytes: 0.02 10*3/uL (ref 0.00–0.07)
Basophils Absolute: 0.1 10*3/uL (ref 0.0–0.1)
Basophils Relative: 1 %
Eosinophils Absolute: 0.1 10*3/uL (ref 0.0–0.5)
Eosinophils Relative: 1 %
HCT: 41.9 % (ref 36.0–46.0)
Hemoglobin: 14 g/dL (ref 12.0–15.0)
Immature Granulocytes: 0 %
Lymphocytes Relative: 25 %
Lymphs Abs: 2.2 10*3/uL (ref 0.7–4.0)
MCH: 33.3 pg (ref 26.0–34.0)
MCHC: 33.4 g/dL (ref 30.0–36.0)
MCV: 99.5 fL (ref 80.0–100.0)
Monocytes Absolute: 1 10*3/uL (ref 0.1–1.0)
Monocytes Relative: 12 %
Neutro Abs: 5.3 10*3/uL (ref 1.7–7.7)
Neutrophils Relative %: 61 %
Platelets: 336 10*3/uL (ref 150–400)
RBC: 4.21 MIL/uL (ref 3.87–5.11)
RDW: 12.9 % (ref 11.5–15.5)
WBC: 8.7 10*3/uL (ref 4.0–10.5)
nRBC: 0 % (ref 0.0–0.2)

## 2022-01-15 MED ORDER — DIPHENHYDRAMINE HCL 25 MG PO CAPS: 25.0000 mg | ORAL_CAPSULE | ORAL | Status: DC

## 2022-01-15 MED ORDER — SODIUM CHLORIDE 0.9 % IV SOLN
2.0000 mg/kg | INTRAVENOUS | Status: AC
  Administered 2022-01-15: 91.25 mg via INTRAVENOUS
  Filled 2022-01-15: qty 7.3

## 2022-01-15 MED ORDER — ACETAMINOPHEN 325 MG PO TABS: 650.0000 mg | ORAL_TABLET | ORAL | Status: DC

## 2022-01-15 NOTE — Progress Notes (Signed)
Next infusion scheduled for [name] Aria on 03/12/22 and due for updated orders. Diagnosis: PsA/psoriasis. Alsot taking MTX 15mg  weekly  Dose: 2mg /kg every 8 weeks (92.5mg  based on last recorded weight of 46.3kg)  Last Clinic Visit: 12/27/21 Next Clinic Visit: 05/30/22  Last infusion: 01/15/22  Labs: CBC and CMP on 01/15/22 - AST elevated, CBC wnl TB Gold: negative on 09/10/21   Orders placed for [name] Aria x 2 doses along with premedication of acetaminophen and diphenhydramine to be administered 30 minutes before medication infusion.  Standing CBC with diff/platelet and CMP with GFR orders placed to be drawn every 2 months.  Next TB gold due 09/11/22  Knox Saliva, PharmD, MPH, BCPS, CPP Clinical Pharmacist (Rheumatology and Pulmonology)

## 2022-01-15 NOTE — Progress Notes (Signed)
Liver function is elevated.  Please advise patient to avoid all NSAIDs and alcohol use.  Potassium is elevated most likely hemolyzed.  Please forward results to her PCP.  CBC is normal.

## 2022-01-22 ENCOUNTER — Telehealth: Payer: Self-pay | Admitting: Pharmacist

## 2022-01-22 NOTE — Telephone Encounter (Signed)
Received notification from [facility] regarding a prior authorization for  Amy Hayes . Authorization has been automatically extended and APPROVED from 04/22/21 to 04/21/22.   Authorization # [id number]  Knox Saliva, PharmD, MPH, BCPS, CPP Clinical Pharmacist (Rheumatology and Pulmonology)

## 2022-03-06 ENCOUNTER — Other Ambulatory Visit: Payer: Self-pay | Admitting: Rheumatology

## 2022-03-06 NOTE — Telephone Encounter (Signed)
Next Visit: 05/30/2022  Last Visit: 12/27/2021  Last Fill: 12/05/2021  DX: Psoriatic arthropathy   Current Dose per office note 12/27/2021: methotrexate 6 tablets by mouth every 7 days,   Labs: 01/15/2022 Liver function is elevated. Potassium is elevated most likely hemolyzed. CBC is normal.   Okay to refill MTX?

## 2022-03-12 ENCOUNTER — Ambulatory Visit (HOSPITAL_COMMUNITY)
Admission: RE | Admit: 2022-03-12 | Discharge: 2022-03-12 | Disposition: A | Discharge status: Home / Self Care | Payer: Medicare (Managed Care) | Source: Ambulatory Visit | Attending: Rheumatology | Admitting: Rheumatology

## 2022-03-12 DIAGNOSIS — L4050 Arthropathic psoriasis, unspecified: Secondary | ICD-10-CM | POA: Insufficient documentation

## 2022-03-12 DIAGNOSIS — Z79899 Other long term (current) drug therapy: Secondary | ICD-10-CM | POA: Diagnosis present

## 2022-03-12 DIAGNOSIS — L409 Psoriasis, unspecified: Secondary | ICD-10-CM | POA: Diagnosis present

## 2022-03-12 LAB — CBC WITH DIFFERENTIAL/PLATELET
Abs Immature Granulocytes: 0.05 10*3/uL (ref 0.00–0.07)
Basophils Absolute: 0 10*3/uL (ref 0.0–0.1)
Basophils Relative: 0 %
Eosinophils Absolute: 0.1 10*3/uL (ref 0.0–0.5)
Eosinophils Relative: 1 %
HCT: 41.8 % (ref 36.0–46.0)
Hemoglobin: 13.7 g/dL (ref 12.0–15.0)
Immature Granulocytes: 0 %
Lymphocytes Relative: 16 %
Lymphs Abs: 1.9 10*3/uL (ref 0.7–4.0)
MCH: 32.7 pg (ref 26.0–34.0)
MCHC: 32.8 g/dL (ref 30.0–36.0)
MCV: 99.8 fL (ref 80.0–100.0)
Monocytes Absolute: 1.3 10*3/uL — ABNORMAL HIGH (ref 0.1–1.0)
Monocytes Relative: 10 %
Neutro Abs: 8.9 10*3/uL — ABNORMAL HIGH (ref 1.7–7.7)
Neutrophils Relative %: 73 %
Platelets: 382 10*3/uL (ref 150–400)
RBC: 4.19 MIL/uL (ref 3.87–5.11)
RDW: 12.8 % (ref 11.5–15.5)
WBC: 12.2 10*3/uL — ABNORMAL HIGH (ref 4.0–10.5)
nRBC: 0 % (ref 0.0–0.2)

## 2022-03-12 LAB — COMPREHENSIVE METABOLIC PANEL
ALT: 16 U/L (ref 0–44)
AST: 24 U/L (ref 15–41)
Albumin: 3.6 g/dL (ref 3.5–5.0)
Alkaline Phosphatase: 72 U/L (ref 38–126)
Anion gap: 8 (ref 5–15)
BUN: 20 mg/dL (ref 8–23)
CO2: 26 mmol/L (ref 22–32)
Calcium: 9.3 mg/dL (ref 8.9–10.3)
Chloride: 101 mmol/L (ref 98–111)
Creatinine, Ser: 0.71 mg/dL (ref 0.44–1.00)
GFR, Estimated: >60 mL/min (ref >60)
Glucose, Bld: 95 mg/dL (ref 70–99)
Potassium: 3.9 mmol/L (ref 3.5–5.1)
Sodium: 135 mmol/L (ref 135–145)
Total Bilirubin: 0.8 mg/dL (ref 0.3–1.2)
Total Protein: 6.9 g/dL (ref 6.5–8.1)

## 2022-03-12 MED ORDER — ACETAMINOPHEN 325 MG PO TABS: 650.0000 mg | ORAL_TABLET | ORAL | Status: DC

## 2022-03-12 MED ORDER — DIPHENHYDRAMINE HCL 25 MG PO CAPS: 25.0000 mg | ORAL_CAPSULE | ORAL | Status: DC

## 2022-03-12 MED ORDER — SODIUM CHLORIDE 0.9 % IV SOLN
2.0000 mg/kg | INTRAVENOUS | Status: DC
  Administered 2022-03-12: 91.25 mg via INTRAVENOUS
  Filled 2022-03-12: qty 7.3

## 2022-03-12 NOTE — Progress Notes (Signed)
White cell count is elevated.  Please ask if patient had a recent infection or prednisone use or cortisone injection.  CMP is normal.

## 2022-04-19 ENCOUNTER — Telehealth: Payer: Self-pay | Admitting: Pharmacist

## 2022-04-19 NOTE — Telephone Encounter (Signed)
Patient has [facility] plan. ATC patient to determine if she plans to have any insurance in 2024. Left VM. She receives [facility] ARIA infusions ([id number], [id number])  Next infusion: 05/07/2022  Marchia Bond provider services to determine benefits for [facility] ARIA.  For in-network services, patient has $0 copay. Patient's plan is active from 04/22/21 through 04/22/2023. [employer] is in-network. O6712 is covered under Part B benefit. Patient has $0 copay. Patient has no coinsurance for Part B drugs.  Authorization is required for J1602.  Provider Phone: [phone] Call ref # [id number]  Initiated authorization via phone with rep in biologics department. Turnaround time is 72 hours. Clinicals sent via fax today.  Authorization phone: [phone] Pending auth # [id number] Fax: [phone]  Chesley Mires, PharmD, MPH, BCPS, CPP Clinical Pharmacist (Rheumatology and Pulmonology)

## 2022-04-23 NOTE — Telephone Encounter (Signed)
Received fax from [facility] for [place] approval [phone]). Authorization is APPROVED from 04/22/2022 through 04/23/2023 for [facility] as the site of care.  Authorization # [id number]  Authorization phone: [phone]  Knox Saliva, PharmD, MPH, BCPS, CPP Clinical Pharmacist (Rheumatology and Pulmonology)

## 2022-04-24 ENCOUNTER — Encounter: Payer: Self-pay | Admitting: Rheumatology

## 2022-05-03 NOTE — Progress Notes (Deleted)
Office Visit Note  Patient: Amy Hayes             Date of Birth: [birth year]           MRN: [id number]             PCP: Elliot Dally, MD Referring: Elliot Dally, MD Visit Date: 05/08/2022 Occupation: @GUAROCC$ @  Subjective:  No chief complaint on file.   History of Present Illness: Amy Hayes is a 77 y.o. female ***     Activities of Daily Living:  Patient reports morning stiffness for *** {minute/hour:19697}.   Patient {ACTIONS;DENIES/REPORTS:21021675::"Denies"} nocturnal pain.  Difficulty dressing/grooming: {ACTIONS;DENIES/REPORTS:21021675::"Denies"} Difficulty climbing stairs: {ACTIONS;DENIES/REPORTS:21021675::"Denies"} Difficulty getting out of chair: {ACTIONS;DENIES/REPORTS:21021675::"Denies"} Difficulty using hands for taps, buttons, cutlery, and/or writing: {ACTIONS;DENIES/REPORTS:21021675::"Denies"}  No Rheumatology ROS completed.   PMFS History:  Patient Active Problem List   Diagnosis Date Noted   Essential hypertension 12/23/2019   Pain in both lower extremities 12/23/2019   Psoriatic arthropathy ([place]) 12/22/2018   DDD (degenerative disc disease), lumbar 07/24/2017   Primary osteoarthritis of both hands 03/04/2017   DDD (degenerative disc disease), cervical s/p Fusion  10/30/2016   Raynaud's disease without gangrene 10/30/2016   History of migraine 10/30/2016   History of gastroesophageal reflux (GERD) 10/30/2016   High risk medication use 10/30/2016   Psoriasis 05/02/2016   Bilateral low back pain with sciatica 05/02/2016   Osteopenia 05/02/2016    Past Medical History:  Diagnosis Date   Arthritis    GERD (gastroesophageal reflux disease)    Hyperlipidemia    Hypertension    Migraine    Osteoporosis     Family History  Problem Relation Age of Onset   Leukemia Mother    Stroke Father    Hypertension Sister    Hypertension Brother    Stroke Brother    Past Surgical History:  Procedure Laterality Date   ABDOMINAL HYSTERECTOMY      CARPAL TUNNEL RELEASE     CERVICAL FUSION     EYE SURGERY     ingrown toenail removal     KNEE ARTHROSCOPY Right    KNEE CARTILAGE SURGERY     ROTATOR CUFF REPAIR     SPINAL FUSION     cervical fusion   Social History   Social History Narrative   Not on file   Immunization History  Administered Date(s) Administered   Influenza Split 02/15/2010   Influenza, High Dose Seasonal PF 02/06/2015, 01/23/2016, 01/20/2017, 02/20/2018   Influenza, Seasonal, Injecte, Preservative Fre 02/11/2011, 01/04/2013, 04/05/2014   Influenza,trivalent, recombinat, inj, PF 01/31/2012   PFIZER(Purple Top)SARS-COV-2 Vaccination 07/05/2019, 07/26/2019, 12/17/2019, 07/04/2020, 03/07/2021   Pfizer Covid-19 Vaccine Bivalent Booster 70yr & up 03/07/2021   Pneumococcal Conjugate-13 08/11/2014   Pneumococcal Polysaccharide-23 05/29/2009, 08/29/2015   Td 07/06/2018   Tdap 12/17/2007   Zoster Recombinat (Shingrix) 02/27/2018, 06/12/2018     Objective: Vital Signs: There were no vitals taken for this visit.   Physical Exam   Musculoskeletal Exam: ***  CDAI Exam: CDAI Score: -- Patient Global: --; Provider Global: -- Swollen: --; Tender: -- Joint Exam 05/08/2022   No joint exam has been documented for this visit   There is currently no information documented on the homunculus. Go to the Rheumatology activity and complete the homunculus joint exam.  Investigation: No additional findings.  Imaging: No results found.  Recent Labs: Lab Results  Component Value Date   WBC 12.2 (H) 03/12/2022   HGB 13.7 03/12/2022   PLT 382 03/12/2022  NA 135 03/12/2022   K 3.9 03/12/2022   CL 101 03/12/2022   CO2 26 03/12/2022   GLUCOSE 95 03/12/2022   BUN 20 03/12/2022   CREATININE 0.71 03/12/2022   BILITOT 0.8 03/12/2022   ALKPHOS 72 03/12/2022   AST 24 03/12/2022   ALT 16 03/12/2022   PROT 6.9 03/12/2022   ALBUMIN 3.6 03/12/2022   CALCIUM 9.3 03/12/2022   GFRAA >60 12/09/2019   QFTBGOLDPLUS  Negative 09/10/2021    Speciality Comments: [name] Aria infusions every 8 weeks, started on 11/2016 TB gold negative 08/01/16  Procedures:  No procedures performed Allergies: Codeine   Assessment / Plan:     Visit Diagnoses: No diagnosis found.  Orders: No orders of the defined types were placed in this encounter.  No orders of the defined types were placed in this encounter.   Face-to-face time spent with patient was *** minutes. Greater than 50% of time was spent in counseling and coordination of care.  Follow-Up Instructions: No follow-ups on file.   Earnestine Mealing, CMA  Note - This record has been created using Editor, commissioning.  Chart creation errors have been sought, but may not always  have been located. Such creation errors do not reflect on  the standard of medical care.

## 2022-05-07 ENCOUNTER — Ambulatory Visit (HOSPITAL_COMMUNITY)
Admission: RE | Admit: 2022-05-07 | Discharge: 2022-05-07 | Disposition: A | Discharge status: Home / Self Care | Payer: Medicare (Managed Care) | Source: Ambulatory Visit | Attending: Rheumatology | Admitting: Rheumatology

## 2022-05-07 ENCOUNTER — Other Ambulatory Visit: Payer: Self-pay | Admitting: Pharmacist

## 2022-05-07 DIAGNOSIS — Z111 Encounter for screening for respiratory tuberculosis: Secondary | ICD-10-CM

## 2022-05-07 DIAGNOSIS — L4050 Arthropathic psoriasis, unspecified: Secondary | ICD-10-CM

## 2022-05-07 DIAGNOSIS — M810 Age-related osteoporosis without current pathological fracture: Secondary | ICD-10-CM

## 2022-05-07 DIAGNOSIS — Z5181 Encounter for therapeutic drug level monitoring: Secondary | ICD-10-CM

## 2022-05-07 DIAGNOSIS — Z79899 Other long term (current) drug therapy: Secondary | ICD-10-CM | POA: Diagnosis present

## 2022-05-07 DIAGNOSIS — L409 Psoriasis, unspecified: Secondary | ICD-10-CM

## 2022-05-07 LAB — COMPREHENSIVE METABOLIC PANEL
ALT: 16 U/L (ref 0–44)
AST: 26 U/L (ref 15–41)
Albumin: 3.9 g/dL (ref 3.5–5.0)
Alkaline Phosphatase: 61 U/L (ref 38–126)
Anion gap: 8 (ref 5–15)
BUN: 18 mg/dL (ref 8–23)
CO2: 26 mmol/L (ref 22–32)
Calcium: 9.2 mg/dL (ref 8.9–10.3)
Chloride: 101 mmol/L (ref 98–111)
Creatinine, Ser: 0.57 mg/dL (ref 0.44–1.00)
GFR, Estimated: >60 mL/min (ref >60)
Glucose, Bld: 95 mg/dL (ref 70–99)
Potassium: 4 mmol/L (ref 3.5–5.1)
Sodium: 135 mmol/L (ref 135–145)
Total Bilirubin: 0.7 mg/dL (ref 0.3–1.2)
Total Protein: 7 g/dL (ref 6.5–8.1)

## 2022-05-07 LAB — CBC WITH DIFFERENTIAL/PLATELET
Abs Immature Granulocytes: 0.03 10*3/uL (ref 0.00–0.07)
Basophils Absolute: 0.1 10*3/uL (ref 0.0–0.1)
Basophils Relative: 1 %
Eosinophils Absolute: 0.2 10*3/uL (ref 0.0–0.5)
Eosinophils Relative: 2 %
HCT: 42.3 % (ref 36.0–46.0)
Hemoglobin: 14.3 g/dL (ref 12.0–15.0)
Immature Granulocytes: 0 %
Lymphocytes Relative: 29 %
Lymphs Abs: 2.5 10*3/uL (ref 0.7–4.0)
MCH: 33.4 pg (ref 26.0–34.0)
MCHC: 33.8 g/dL (ref 30.0–36.0)
MCV: 98.8 fL (ref 80.0–100.0)
Monocytes Absolute: 0.8 10*3/uL (ref 0.1–1.0)
Monocytes Relative: 9 %
Neutro Abs: 5.2 10*3/uL (ref 1.7–7.7)
Neutrophils Relative %: 59 %
Platelets: 312 10*3/uL (ref 150–400)
RBC: 4.28 MIL/uL (ref 3.87–5.11)
RDW: 12.9 % (ref 11.5–15.5)
WBC: 8.9 10*3/uL (ref 4.0–10.5)
nRBC: 0 % (ref 0.0–0.2)

## 2022-05-07 MED ORDER — DIPHENHYDRAMINE HCL 25 MG PO CAPS: 25.0000 mg | ORAL_CAPSULE | ORAL | Status: DC

## 2022-05-07 MED ORDER — ACETAMINOPHEN 325 MG PO TABS: 650.0000 mg | ORAL_TABLET | ORAL | Status: DC

## 2022-05-07 MED ORDER — SODIUM CHLORIDE 0.9 % IV SOLN
2.0000 mg/kg | INTRAVENOUS | Status: AC
  Administered 2022-05-07: 92.5 mg via INTRAVENOUS
  Filled 2022-05-07: qty 7.4

## 2022-05-07 NOTE — Progress Notes (Signed)
Next infusion scheduled for [name] Aria on 07/02/2022 and due for updated orders. Diagnosis:  Dose: 2mg /kg every 8 weeks (95mg  based on last recorded weight of 47.2kg)  Last Clinic Visit: 05/30/2022 Next Clinic Visit: 08/28/2022  Last infusion: 05/09/2022  Labs: CBC and CMP on 05/07/2022 wnl TB Gold: negative on 09/10/2021   Orders placed for [name] Aria x 2 doses along with premedication of acetaminophen and diphenhydramine to be administered 30 minutes before medication infusion.  Standing CBC with diff/platelet and CMP with GFR orders placed to be drawn every 2 months.  Next TB gold due 09/11/2022. Order also placed for Vitamin D.  Knox Saliva, PharmD, MPH, BCPS, CPP Clinical Pharmacist (Rheumatology and Pulmonology)

## 2022-05-08 ENCOUNTER — Ambulatory Visit: Payer: Medicare (Managed Care) | Admitting: Physician Assistant

## 2022-05-08 DIAGNOSIS — L4050 Arthropathic psoriasis, unspecified: Secondary | ICD-10-CM

## 2022-05-08 DIAGNOSIS — Z8669 Personal history of other diseases of the nervous system and sense organs: Secondary | ICD-10-CM

## 2022-05-08 DIAGNOSIS — M898X5 Other specified disorders of bone, thigh: Secondary | ICD-10-CM

## 2022-05-08 DIAGNOSIS — L409 Psoriasis, unspecified: Secondary | ICD-10-CM

## 2022-05-08 DIAGNOSIS — I7300 Raynaud's syndrome without gangrene: Secondary | ICD-10-CM

## 2022-05-08 DIAGNOSIS — Z79899 Other long term (current) drug therapy: Secondary | ICD-10-CM

## 2022-05-08 DIAGNOSIS — M8589 Other specified disorders of bone density and structure, multiple sites: Secondary | ICD-10-CM

## 2022-05-08 DIAGNOSIS — Z8719 Personal history of other diseases of the digestive system: Secondary | ICD-10-CM

## 2022-05-08 DIAGNOSIS — I10 Essential (primary) hypertension: Secondary | ICD-10-CM

## 2022-05-08 DIAGNOSIS — M19041 Primary osteoarthritis, right hand: Secondary | ICD-10-CM

## 2022-05-08 DIAGNOSIS — M5136 Other intervertebral disc degeneration, lumbar region: Secondary | ICD-10-CM

## 2022-05-08 DIAGNOSIS — M5030 Other cervical disc degeneration, unspecified cervical region: Secondary | ICD-10-CM

## 2022-05-08 DIAGNOSIS — M25552 Pain in left hip: Secondary | ICD-10-CM

## 2022-05-13 ENCOUNTER — Telehealth: Payer: Self-pay | Admitting: *Deleted

## 2022-05-13 NOTE — Telephone Encounter (Signed)
Received DEXA results from [facility]  Date of Scan: 09/14/2021  Lowest T-score:-1.6  BMD:0.817  Lowest site measured:Right Femoral Neck   DX: Osteopenia  Significant changes in BMD and site measured (5% and above):-12.1% Spine, -8.7% Bilateral Femurs  Current Regimen:Calcium, Vitamin D   Recommendation:Discuss at follow up visit.   Reviewed by:Dr. Bo Merino   Next Appointment:  05/30/2022

## 2022-05-18 ENCOUNTER — Other Ambulatory Visit: Payer: Self-pay | Admitting: Rheumatology

## 2022-05-20 NOTE — Progress Notes (Signed)
Office Visit Note  Patient: Amy Hayes             Date of Birth: February 04, 1946           MRN: [id number]             PCP: Elliot Dally, MD Referring: Elliot Dally, MD Visit Date: 05/30/2022 Occupation: @GUAROCC @  Subjective:  Medication management and discuss DEXA results  History of Present Illness: Amy Hayes is a 77 y.o. female with history of psoriatic arthritis, osteoarthritis degenerative disc disease.  She states she has been doing fairly well on the combination of [name] Aria infusions every 8 weeks and methotrexate 6 tablets p.o. weekly.  She has not had any flares of psoriatic arthritis.  She continues to have some discomfort in her hands due to underlying osteoarthritis.  She also has chronic neck and lower back discomfort.  She has some SI joint discomfort.  She has not had any psoriasis lesions but she has noticed dry skin on her forehead which itches.  She wanted to discuss her DEXA results today.    Activities of Daily Living:  Patient reports morning stiffness for 10 minutes.   Patient Denies nocturnal pain.  Difficulty dressing/grooming: Denies Difficulty climbing stairs: Reports Difficulty getting out of chair: Denies Difficulty using hands for taps, buttons, cutlery, and/or writing: Reports  Review of Systems  Constitutional:  Positive for fatigue.  HENT:  Negative for mouth sores and mouth dryness.   Eyes:  Positive for dryness.  Respiratory:  Negative for shortness of breath.   Cardiovascular:  Negative for chest pain and palpitations.  Gastrointestinal:  Negative for blood in stool, constipation and diarrhea.  Endocrine: Negative for increased urination.  Genitourinary:  Negative for involuntary urination.  Musculoskeletal:  Positive for joint pain, joint pain, joint swelling, myalgias, morning stiffness and myalgias. Negative for gait problem, muscle weakness and muscle tenderness.  Skin:  Negative for color change, rash, hair loss and  sensitivity to sunlight.  Allergic/Immunologic: Negative for susceptible to infections.  Neurological:  Positive for headaches. Negative for dizziness.  Hematological:  Negative for swollen glands.  Psychiatric/Behavioral:  Negative for depressed mood and sleep disturbance. The patient is not nervous/anxious.     PMFS History:  Patient Active Problem List   Diagnosis Date Noted   Essential hypertension 12/23/2019   Pain in both lower extremities 12/23/2019   Psoriatic arthropathy ([place]) 12/22/2018   DDD (degenerative disc disease), lumbar 07/24/2017   Primary osteoarthritis of both hands 03/04/2017   DDD (degenerative disc disease), cervical s/p Fusion  10/30/2016   Raynaud's disease without gangrene 10/30/2016   History of migraine 10/30/2016   History of gastroesophageal reflux (GERD) 10/30/2016   High risk medication use 10/30/2016   Psoriasis 05/02/2016   Bilateral low back pain with sciatica 05/02/2016   Osteopenia 05/02/2016    Past Medical History:  Diagnosis Date   Arthritis    GERD (gastroesophageal reflux disease)    Hyperlipidemia    Hypertension    Migraine    Osteoporosis     Family History  Problem Relation Age of Onset   Leukemia Mother    Stroke Father    Hypertension Sister    Hypertension Brother    Stroke Brother    Past Surgical History:  Procedure Laterality Date   ABDOMINAL HYSTERECTOMY     CARPAL TUNNEL RELEASE     CERVICAL FUSION     EYE SURGERY     ingrown toenail removal  KNEE ARTHROSCOPY Right    KNEE CARTILAGE SURGERY     ROTATOR CUFF REPAIR     SPINAL FUSION     cervical fusion   Social History   Social History Narrative   Not on file   Immunization History  Administered Date(s) Administered   Influenza Split 02/15/2010   Influenza, High Dose Seasonal PF 02/06/2015, 01/23/2016, 01/20/2017, 02/20/2018   Influenza, Seasonal, Injecte, Preservative Fre 02/11/2011, 01/04/2013, 04/05/2014   Influenza,trivalent, recombinat, inj,  PF 01/31/2012   PFIZER(Purple Top)SARS-COV-2 Vaccination 07/05/2019, 07/26/2019, 12/17/2019, 07/04/2020, 03/07/2021   Pfizer Covid-19 Vaccine Bivalent Booster 63yrs & up 03/07/2021   Pneumococcal Conjugate-13 08/11/2014   Pneumococcal Polysaccharide-23 05/29/2009, 08/29/2015   Td 07/06/2018   Tdap 12/17/2007   Zoster Recombinat (Shingrix) 02/27/2018, 06/12/2018     Objective: Vital Signs: BP 120/64 (BP Location: Left Arm, Patient Position: Sitting, Cuff Size: Small)   Pulse (!) 53   Resp 12   Ht 5' (1.524 m)   Wt 104 lb (47.2 kg)   BMI 20.31 kg/m    Physical Exam Vitals and nursing note reviewed.  Constitutional:      Appearance: She is well-developed.  HENT:     Head: Normocephalic and atraumatic.  Eyes:     Conjunctiva/sclera: Conjunctivae normal.  Cardiovascular:     Rate and Rhythm: Normal rate and regular rhythm.     Heart sounds: Normal heart sounds.  Pulmonary:     Effort: Pulmonary effort is normal.     Breath sounds: Normal breath sounds.  Abdominal:     General: Bowel sounds are normal.     Palpations: Abdomen is soft.  Musculoskeletal:     Cervical back: Normal range of motion.  Lymphadenopathy:     Cervical: No cervical adenopathy.  Skin:    General: Skin is warm and dry.     Capillary Refill: Capillary refill takes less than 2 seconds.  Neurological:     Mental Status: She is alert and oriented to person, place, and time.  Psychiatric:        Behavior: Behavior normal.      Musculoskeletal Exam: She had limited range of motion cervical spine and lumbar spine.  Shoulder joints, elbow joints, wrist joints were in good range of motion.  She had bilateral PIP and DIP thickening.  She has subluxation of some of the DIP joints.  No synovitis was noted.  Hip joints and knee joints were in good range of motion.  There was no tenderness over ankles or MTPs.  CDAI Exam: CDAI Score: -- Patient Global: --; Provider Global: -- Swollen: --; Tender: -- Joint Exam  05/30/2022   No joint exam has been documented for this visit   There is currently no information documented on the homunculus. Go to the Rheumatology activity and complete the homunculus joint exam.  Investigation: No additional findings.  Imaging: No results found.  Recent Labs: Lab Results  Component Value Date   WBC 8.9 05/07/2022   HGB 14.3 05/07/2022   PLT 312 05/07/2022   NA 135 05/07/2022   K 4.0 05/07/2022   CL 101 05/07/2022   CO2 26 05/07/2022   GLUCOSE 95 05/07/2022   BUN 18 05/07/2022   CREATININE 0.57 05/07/2022   BILITOT 0.7 05/07/2022   ALKPHOS 61 05/07/2022   AST 26 05/07/2022   ALT 16 05/07/2022   PROT 7.0 05/07/2022   ALBUMIN 3.9 05/07/2022   CALCIUM 9.2 05/07/2022   GFRAA >60 12/09/2019   QFTBGOLDPLUS Negative 09/10/2021  Speciality Comments: [name] Aria infusions every 8 weeks, started on 11/2016 TB gold negative 08/01/16     Procedures:  No procedures performed Allergies: Codeine   Assessment / Plan:     Visit Diagnoses: Psoriatic arthropathy (HCC)-she had no synovitis on the examination today.  Continues to have bilateral PIP and DIP thickening and subluxation of some of the DIP joints due to underlying osteoarthritis.  She has been tolerating [name] Aria infusions and methotrexate without any side effects.  Psoriasis-she had no active sore lesions.  She notices some dry skin on her forehead.  High risk medication use - [name] Aria IV 2 mg/kg every 8 weeks, methotrexate 6 tablets by mouth every 7 days, and folic acid 1 mg 2 tablets daily.  Labs obtained on May 07, 2022 CBC and CMP were normal.  TB Gold was negative on Sep 10, 2021.  She will be getting labs with her infusions.  Information regarding immunization was placed in the AVS.  She was advised to hold [name] and methotrexate if she develops an infection and resume after the infection resolves.  Annual skin examination to screen for skin cancer was advised.  Use of sunscreen was  advised.  Primary osteoarthritis of both hands-she has severe osteoarthritis in bilateral hands with DIP and PIP thickening.  DDD (degenerative disc disease), cervical - s/p fusion.  She has limited range of motion without discomfort.  DDD (degenerative disc disease), lumbar-she continues to have her lower back pain and discomfort.  Raynaud's disease without gangrene-she has intermittent times.  No sclerodactyly or nailbed capillary changes were noted.  Osteopenia of multiple sites -26 2023 DEXA scan T-score -1.6, BMD 0.817 right femoral neck, decreased by 8.7% when compared 2021.  T-score 1.3, BMD 1.333 decreased by 12.1% when compared to 2021. DEXA scan with her PCP. DEXA 09/14/2019.  - Plan: VITAMIN D 25 Hydroxy (Vit-D Deficiency, Fractures) with her next labs.  Detailed counsel regarding osteopenia was provided.  She has significant drop in her BMD.  Her spine BMD may not be accurate due to degenerative disc disease.  We discussed possible use of alendronate.  Patient is interested in starting alendronate due to significant drop in her BMD.  Use of calcium rich diet and vitamin D was discussed.  After indications side effects contraindications were discussed she was also given a prescription for alendronate 70 mg p.o. weekly empty stomach with 8 ounces of water.  Directions were placed in the AVS.  History of gastroesophageal reflux (GERD)-she has mild gastroesophageal reflux.  I advised her to contact us if her reflux symptoms get worse on Fosamax.  Essential hypertension-blood pressure was normal at 120/64 today.  History of migraine  Orders: Orders Placed This Encounter  Procedures   VITAMIN D 25 Hydroxy (Vit-D Deficiency, Fractures)   Meds ordered this encounter  Medications   alendronate (FOSAMAX) 70 MG tablet    Sig: Take 1 tablet (70 mg total) by mouth once a week. Take with a full glass of water on an empty stomach.    Dispense:  30 tablet    Refill:  2     Follow-Up  Instructions: Return in about 3 months (around 08/28/2022) for Psoriatic arthritis.   Bo Merino, MD  Note - This record has been created using Editor, commissioning.  Chart creation errors have been sought, but may not always  have been located. Such creation errors do not reflect on  the standard of medical care.

## 2022-05-30 ENCOUNTER — Ambulatory Visit: Payer: Medicare (Managed Care) | Attending: Rheumatology | Admitting: Rheumatology

## 2022-05-30 ENCOUNTER — Encounter: Payer: Self-pay | Admitting: Pharmacist

## 2022-05-30 ENCOUNTER — Encounter: Payer: Self-pay | Admitting: Rheumatology

## 2022-05-30 VITALS — BP 120/64 | HR 53 | Resp 12 | Ht 60.0 in | Wt 104.0 lb

## 2022-05-30 DIAGNOSIS — I7300 Raynaud's syndrome without gangrene: Secondary | ICD-10-CM

## 2022-05-30 DIAGNOSIS — Z8719 Personal history of other diseases of the digestive system: Secondary | ICD-10-CM

## 2022-05-30 DIAGNOSIS — L4050 Arthropathic psoriasis, unspecified: Secondary | ICD-10-CM

## 2022-05-30 DIAGNOSIS — M19042 Primary osteoarthritis, left hand: Secondary | ICD-10-CM

## 2022-05-30 DIAGNOSIS — M5136 Other intervertebral disc degeneration, lumbar region: Secondary | ICD-10-CM

## 2022-05-30 DIAGNOSIS — M898X5 Other specified disorders of bone, thigh: Secondary | ICD-10-CM

## 2022-05-30 DIAGNOSIS — M19041 Primary osteoarthritis, right hand: Secondary | ICD-10-CM | POA: Diagnosis not present

## 2022-05-30 DIAGNOSIS — M8589 Other specified disorders of bone density and structure, multiple sites: Secondary | ICD-10-CM

## 2022-05-30 DIAGNOSIS — M25552 Pain in left hip: Secondary | ICD-10-CM

## 2022-05-30 DIAGNOSIS — Z79899 Other long term (current) drug therapy: Secondary | ICD-10-CM | POA: Diagnosis not present

## 2022-05-30 DIAGNOSIS — L409 Psoriasis, unspecified: Secondary | ICD-10-CM | POA: Diagnosis not present

## 2022-05-30 DIAGNOSIS — Z8669 Personal history of other diseases of the nervous system and sense organs: Secondary | ICD-10-CM

## 2022-05-30 DIAGNOSIS — I10 Essential (primary) hypertension: Secondary | ICD-10-CM

## 2022-05-30 DIAGNOSIS — M5030 Other cervical disc degeneration, unspecified cervical region: Secondary | ICD-10-CM

## 2022-05-30 MED ORDER — ALENDRONATE SODIUM 70 MG PO TABS: 70.0000 mg | ORAL_TABLET | ORAL | 2 refills | Status: AC

## 2022-05-30 NOTE — Telephone Encounter (Signed)
Errir

## 2022-05-30 NOTE — Patient Instructions (Signed)
Alendronate Tablets What is this medication? ALENDRONATE (a LEN droe nate) prevents and treats osteoporosis. It may also be used to treat Paget disease of the bone. It works by making your bones stronger and less likely to break (fracture). It belongs to a group of medications called bisphosphonates. This medicine may be used for other purposes; ask your health care provider or pharmacist if you have questions. COMMON BRAND NAME(S): Fosamax What should I tell my care team before I take this medication? They need to know if you have any of these conditions: Bleeding disorder Cancer Dental disease Difficulty swallowing Infection (fever, chills, cough, sore throat, pain or trouble passing urine) Kidney disease Low levels of calcium or other minerals in the blood Low red blood cell counts Receiving steroids like dexamethasone or prednisone Stomach or intestine problems Trouble sitting or standing for 30 minutes An unusual or allergic reaction to alendronate, other medications, foods, dyes or preservatives Pregnant or trying to get pregnant Breast-feeding How should I use this medication? Take this medication by mouth with a full glass of water. Take it as directed on the prescription label at the same time every day. Take the dose right after waking up. Do not eat or drink anything before taking it. Do not take it with any other drink except water. Do not chew or crush the tablet. After taking it, do not eat breakfast, drink, or take any other medications or vitamins for at least 30 minutes. Sit or stand up for at least 30 minutes after you take it. Do not lie down. Keep taking it unless your care team tells you to stop. A special MedGuide will be given to you by the pharmacist with each prescription and refill. Be sure to read this information carefully each time. Talk to your care team about the use of this medication in children. Special care may be needed. Overdosage: If you think you have  taken too much of this medicine contact a poison control center or emergency room at once. NOTE: This medicine is only for you. Do not share this medicine with others. What if I miss a dose? If you take your medication once a day, skip it. Take your next dose at the scheduled time the next morning. Do not take two doses on the same day. If you take your medication once a week, take the missed dose on the morning after you remember. Do not take two doses on the same day. What may interact with this medication? Aluminum hydroxide Antacids Aspirin Calcium supplements Medications for inflammation like ibuprofen, naproxen, and others Iron supplements Magnesium supplements Vitamins with minerals This list may not describe all possible interactions. Give your health care provider a list of all the medicines, herbs, non-prescription drugs, or dietary supplements you use. Also tell them if you smoke, drink alcohol, or use illegal drugs. Some items may interact with your medicine. What should I watch for while using this medication? Visit your care team for regular checks on your progress. It may be some time before you see the benefit from this medication. Some people who take this medication have severe bone, joint, or muscle pain. This medication may also increase your risk for jaw problems or a broken thigh bone. Tell your care team right away if you have severe pain in your jaw, bones, joints, or muscles. Tell you care team if you have any pain that does not go away or that gets worse. Tell your dentist and dental surgeon that you are   taking this medication. You should not have major dental surgery while on this medication. See your dentist to have a dental exam and fix any dental problems before starting this medication. Take good care of your teeth while on this medication. Make sure you see your dentist for regular follow-up appointments. You should make sure you get enough calcium and vitamin D  while you are taking this medication. Discuss the foods you eat and the vitamins you take with your care team. You may need blood work done while you are taking this medication. What side effects may I notice from receiving this medication? Side effects that you should report to your care team as soon as possible: Allergic reactions--skin rash, itching, hives, swelling of the face, lips, tongue, or throat Low calcium level--muscle pain or cramps, confusion, tingling, or numbness in the hands or feet Osteonecrosis of the jaw--pain, swelling, or redness in the mouth, numbness of the jaw, poor healing after dental work, unusual discharge from the mouth, visible bones in the mouth Pain or trouble swallowing Severe bone, joint, or muscle pain Stomach bleeding--bloody or black, tar-like stools, vomiting blood or brown material that looks like coffee grounds Side effects that usually do not require medical attention (report to your care team if they continue or are bothersome): Constipation Diarrhea Nausea Stomach pain This list may not describe all possible side effects. Call your doctor for medical advice about side effects. You may report side effects to FDA at 1-800-FDA-1088. Where should I keep my medication? Keep out of the reach of children and pets. Store at room temperature between 15 and 30 degrees C (59 and 86 degrees F). Throw away any unused medication after the expiration date. NOTE: This sheet is a summary. It may not cover all possible information. If you have questions about this medicine, talk to your doctor, pharmacist, or health care provider.  2023 Elsevier/Gold Standard (2020-04-03 00:00:00)  Vaccines You are taking a medication(s) that can suppress your immune system.  The following immunizations are recommended: Flu annually Covid-19  RSV Td/Tdap (tetanus, diphtheria, pertussis) every 10 years Pneumonia (Prevnar 15 then Pneumovax 23 at least 1 year apart.  Alternatively,  can take Prevnar 20 without needing additional dose) Shingrix: 2 doses from 4 weeks to 6 months apart  Please check with your PCP to make sure you are up to date.   If you have signs or symptoms of an infection or start antibiotics: First, call your PCP for workup of your infection. Hold your medication through the infection, until you complete your antibiotics, and until symptoms resolve if you take the following: Injectable medication (Actemra, Benlysta, Cimzia, Cosentyx, Enbrel, Humira, Kevzara, Orencia, Remicade, [name], Stelara, Taltz, Tremfya) Methotrexate Leflunomide (Arava) Mycophenolate (Cellcept) Roma Kayser, or Rinvoq  Please get an annual skin exam to screen for skin cancer

## 2022-06-02 ENCOUNTER — Encounter: Payer: Self-pay | Admitting: Rheumatology

## 2022-06-03 NOTE — Telephone Encounter (Signed)
I called patient, patient verbalized understanding. 

## 2022-06-03 NOTE — Telephone Encounter (Signed)
Patient will require evaluation by her dentist.  Ok to hold fosamax for now.   She will need to be cleared by her dentist if she requires any extensive/invasive dental work prior to restarting fosamax.

## 2022-06-05 ENCOUNTER — Other Ambulatory Visit: Payer: Self-pay | Admitting: Rheumatology

## 2022-06-05 MED ORDER — METHOTREXATE SODIUM 2.5 MG PO TABS: 15.0000 mg | ORAL_TABLET | ORAL | 0 refills | Status: DC

## 2022-06-05 NOTE — Telephone Encounter (Signed)
Next Visit: 08/28/2022  Last Visit: 05/30/2022  Last Fill: 03/06/2022  DX: Psoriatic arthropathy   Current Dose per office note 05/30/2022: methotrexate 6 tablets by mouth every 7 days   Labs: 05/07/2022  CBC and CMP WNL   Okay to refill MTX?

## 2022-06-12 ENCOUNTER — Other Ambulatory Visit: Payer: Self-pay | Admitting: Rheumatology

## 2022-07-02 ENCOUNTER — Ambulatory Visit (HOSPITAL_COMMUNITY)
Admission: RE | Admit: 2022-07-02 | Discharge: 2022-07-02 | Disposition: A | Discharge status: Home / Self Care | Payer: Medicare (Managed Care) | Source: Ambulatory Visit | Attending: Rheumatology | Admitting: Rheumatology

## 2022-07-02 DIAGNOSIS — Z79899 Other long term (current) drug therapy: Secondary | ICD-10-CM | POA: Insufficient documentation

## 2022-07-02 DIAGNOSIS — Z5181 Encounter for therapeutic drug level monitoring: Secondary | ICD-10-CM | POA: Insufficient documentation

## 2022-07-02 DIAGNOSIS — M810 Age-related osteoporosis without current pathological fracture: Secondary | ICD-10-CM | POA: Diagnosis not present

## 2022-07-02 DIAGNOSIS — L409 Psoriasis, unspecified: Secondary | ICD-10-CM

## 2022-07-02 DIAGNOSIS — L4050 Arthropathic psoriasis, unspecified: Secondary | ICD-10-CM

## 2022-07-02 LAB — CBC WITH DIFFERENTIAL/PLATELET
Abs Immature Granulocytes: 0.03 10*3/uL (ref 0.00–0.07)
Basophils Absolute: 0.1 10*3/uL (ref 0.0–0.1)
Basophils Relative: 1 %
Eosinophils Absolute: 0.2 10*3/uL (ref 0.0–0.5)
Eosinophils Relative: 2 %
HCT: 40.2 % (ref 36.0–46.0)
Hemoglobin: 13.4 g/dL (ref 12.0–15.0)
Immature Granulocytes: 0 %
Lymphocytes Relative: 27 %
Lymphs Abs: 2.3 10*3/uL (ref 0.7–4.0)
MCH: 32.6 pg (ref 26.0–34.0)
MCHC: 33.3 g/dL (ref 30.0–36.0)
MCV: 97.8 fL (ref 80.0–100.0)
Monocytes Absolute: 0.8 10*3/uL (ref 0.1–1.0)
Monocytes Relative: 9 %
Neutro Abs: 5.3 10*3/uL (ref 1.7–7.7)
Neutrophils Relative %: 61 %
Platelets: 279 10*3/uL (ref 150–400)
RBC: 4.11 MIL/uL (ref 3.87–5.11)
RDW: 12.7 % (ref 11.5–15.5)
WBC: 8.7 10*3/uL (ref 4.0–10.5)
nRBC: 0 % (ref 0.0–0.2)

## 2022-07-02 LAB — VITAMIN D 25 HYDROXY (VIT D DEFICIENCY, FRACTURES): Vit D, 25-Hydroxy: 43.6 ng/mL (ref 30–100)

## 2022-07-02 MED ORDER — ACETAMINOPHEN 325 MG PO TABS: 650.0000 mg | ORAL_TABLET | Freq: Once | ORAL | Status: DC

## 2022-07-02 MED ORDER — SODIUM CHLORIDE 0.9 % IV SOLN
2.0000 mg/kg | INTRAVENOUS | Status: DC
  Administered 2022-07-02: 95 mg via INTRAVENOUS
  Filled 2022-07-02: qty 7.6

## 2022-07-02 NOTE — Progress Notes (Signed)
Vitamin D WNL

## 2022-07-02 NOTE — Progress Notes (Signed)
CBC WNL

## 2022-08-15 NOTE — Progress Notes (Signed)
Office Visit Note  Patient: Amy Hayes             Date of Birth: 01-Sep-1945           MRN: [id number]             PCP: Milda Smart, MD Referring: Milda Smart, MD Visit Date: 08/28/2022 Occupation: @GUAROCC @  Subjective:  Left shoulder joint pain   History of Present Illness: MELVIS PEDLEY is a 77 y.o. female with history of psoriatic arthritis, osteoarthritis, and osteopenia.  Patient remains on [name] Aria IV 2 mg/kg every 8 weeks, methotrexate 6 tablets by mouth every 7 days, and folic acid 1 mg 2 tablets daily.  She is tolerating combination therapy without any side effects and has not missed any doses recently.  Her last infusion was administered yesterday.  She has not missed any doses of methotrexate or folic acid recently.  Patient presents today with left shoulder joint pain which started 1 to 2 months ago.  She has noticed limited range of motion and feels that she is going to crack or tear something with range of motion exercises.  She denies any injury to the shoulder prior to the onset of symptoms.  She has not noticed any improvement after [name] Aria infusions.  Patient states for the past 2 weeks she is also been noticing increased swelling in her left lower leg below the knee.  She denies any pain in the left ankle or left knee joint.  She has noticed some weakness in the left leg. She is tolerating Fosamax without any side effects.  She has been obtaining calcium 1200 mg daily as advised.     Activities of Daily Living:  Patient reports morning stiffness for 5 hours.   Patient Denies nocturnal pain.  Difficulty dressing/grooming: Reports Difficulty climbing stairs: Reports Difficulty getting out of chair: Denies Difficulty using hands for taps, buttons, cutlery, and/or writing: Reports  Review of Systems  Constitutional:  Positive for fatigue.  HENT:  Negative for mouth sores and mouth dryness.   Eyes:  Positive for dryness.  Respiratory:  Negative for  shortness of breath.   Cardiovascular:  Negative for chest pain and palpitations.  Gastrointestinal:  Negative for blood in stool, constipation and diarrhea.  Endocrine: Positive for increased urination.  Genitourinary:  Negative for involuntary urination.  Musculoskeletal:  Positive for joint pain, gait problem, joint pain, joint swelling, myalgias, muscle weakness, morning stiffness and myalgias. Negative for muscle tenderness.  Skin:  Negative for color change, rash, hair loss and sensitivity to sunlight.  Allergic/Immunologic: Negative for susceptible to infections.  Neurological:  Negative for dizziness and headaches.  Hematological:  Negative for swollen glands.  Psychiatric/Behavioral:  Negative for depressed mood and sleep disturbance. The patient is not nervous/anxious.     PMFS History:  Patient Active Problem List   Diagnosis Date Noted   Essential hypertension 12/23/2019   Pain in both lower extremities 12/23/2019   Psoriatic arthropathy (HCC) 12/22/2018   DDD (degenerative disc disease), lumbar 07/24/2017   Primary osteoarthritis of both hands 03/04/2017   DDD (degenerative disc disease), cervical s/p Fusion  10/30/2016   Raynaud's disease without gangrene 10/30/2016   History of migraine 10/30/2016   History of gastroesophageal reflux (GERD) 10/30/2016   High risk medication use 10/30/2016   Psoriasis 05/02/2016   Bilateral low back pain with sciatica 05/02/2016   Osteopenia 05/02/2016    Past Medical History:  Diagnosis Date   Arthritis  GERD (gastroesophageal reflux disease)    Hyperlipidemia    Hypertension    Migraine    Osteoporosis     Family History  Problem Relation Age of Onset   Leukemia Mother    Stroke Father    Hypertension Sister    Hypertension Brother    Stroke Brother    Past Surgical History:  Procedure Laterality Date   ABDOMINAL HYSTERECTOMY     CARPAL TUNNEL RELEASE     CERVICAL FUSION     EYE SURGERY     ingrown toenail  removal     KNEE ARTHROSCOPY Right    KNEE CARTILAGE SURGERY     ROTATOR CUFF REPAIR     SPINAL FUSION     cervical fusion   Social History   Social History Narrative   Not on file   Immunization History  Administered Date(s) Administered   Influenza Split 02/15/2010   Influenza, High Dose Seasonal PF 02/06/2015, 01/23/2016, 01/20/2017, 02/20/2018   Influenza, Seasonal, Injecte, Preservative Fre 02/11/2011, 01/04/2013, 04/05/2014   Influenza,trivalent, recombinat, inj, PF 01/31/2012   PFIZER(Purple Top)SARS-COV-2 Vaccination 07/05/2019, 07/26/2019, 12/17/2019, 07/04/2020, 03/07/2021   Pfizer Covid-19 Vaccine Bivalent Booster 34yrs & up 03/07/2021   Pneumococcal Conjugate-13 08/11/2014   Pneumococcal Polysaccharide-23 05/29/2009, 08/29/2015   Td 07/06/2018   Tdap 12/17/2007   Zoster Recombinat (Shingrix) 02/27/2018, 06/12/2018     Objective: Vital Signs: BP 135/69 (BP Location: Left Arm, Patient Position: Sitting, Cuff Size: Normal)   Pulse (!) 58   Resp 14   Ht 5' (1.524 m)   Wt 104 lb (47.2 kg)   BMI 20.31 kg/m    Physical Exam Vitals and nursing note reviewed.  Constitutional:      Appearance: She is well-developed.  HENT:     Head: Normocephalic and atraumatic.  Eyes:     Conjunctiva/sclera: Conjunctivae normal.  Cardiovascular:     Rate and Rhythm: Normal rate and regular rhythm.     Heart sounds: Normal heart sounds.  Pulmonary:     Effort: Pulmonary effort is normal.     Breath sounds: Normal breath sounds.  Abdominal:     General: Bowel sounds are normal.     Palpations: Abdomen is soft.  Musculoskeletal:     Cervical back: Normal range of motion.  Lymphadenopathy:     Cervical: No cervical adenopathy.  Skin:    General: Skin is warm and dry.     Capillary Refill: Capillary refill takes less than 2 seconds.  Neurological:     Mental Status: She is alert and oriented to person, place, and time.  Psychiatric:        Behavior: Behavior normal.       Musculoskeletal Exam: C-spine has limited range of motion.  Shoulder joints have good range of motion with some discomfort with internal rotation of the left shoulder.  Tenderness over the left subacromial bursa.  Elbow joints and wrist joints have good range of motion.  Severe PIP and DIP thickening noted.  Subluxation of several DIP joints.  No synovitis or dactylitis noted.  Hip joints have good range of motion with no groin pain.  Knee joints have good range of motion.  Small effusion noted in the left knee.  No warmth noted.  Ankle joints have good range of motion with no joint tenderness.  Pitting edema noted bilaterally, left lower leg > right.    CDAI Exam: CDAI Score: -- Patient Global: --; Provider Global: -- Swollen: --; Tender: -- Joint Exam 08/28/2022  No joint exam has been documented for this visit   There is currently no information documented on the homunculus. Go to the Rheumatology activity and complete the homunculus joint exam.  Investigation: No additional findings.  Imaging: XR Shoulder Left  Result Date: 08/28/2022 No glenohumeral joint space narrowing was noted.  Acromioclavicular joint space narrowing and spurring was noted. Impression: These findings are consistent with acromioclavicular arthritis.   Recent Labs: Lab Results  Component Value Date   WBC 6.0 08/27/2022   HGB 12.7 08/27/2022   PLT 279 08/27/2022   NA 135 08/27/2022   K 4.0 08/27/2022   CL 104 08/27/2022   CO2 23 08/27/2022   GLUCOSE 91 08/27/2022   BUN 14 08/27/2022   CREATININE 0.50 08/27/2022   BILITOT 0.7 08/27/2022   ALKPHOS 58 08/27/2022   AST 22 08/27/2022   ALT 16 08/27/2022   PROT 6.5 08/27/2022   ALBUMIN 3.4 (L) 08/27/2022   CALCIUM 8.8 (L) 08/27/2022   GFRAA >60 12/09/2019   QFTBGOLDPLUS Negative 09/10/2021    Speciality Comments: [name] Aria infusions every 8 weeks, started on 11/2016 TB gold negative 08/01/16  Procedures:  Large Joint Inj: L subacromial bursa on  08/28/2022 2:07 PM Indications: pain Details: 27 G 1.5 in needle, posterior approach  Arthrogram: No  Medications: 1 mL lidocaine 1 %; 40 mg triamcinolone acetonide 40 MG/ML Aspirate: 0 mL Outcome: tolerated well, no immediate complications Procedure, treatment alternatives, risks and benefits explained, specific risks discussed. Consent was given by the patient. Immediately prior to procedure a time out was called to verify the correct patient, procedure, equipment, support staff and site/side marked as required. Patient was prepped and draped in the usual sterile fashion.     Allergies: Codeine     Assessment / Plan:     Visit Diagnoses: Psoriatic arthropathy (HCC): She has no synovitis or dactylitis on examination today.  She has not had any signs or symptoms of a psoriatic arthritis flare.  No active psoriasis at this time.  She has a small effusion in the left knee on examination today but is not experiencing any pain at this time.  She has not had any difficulty with ambulation.  Offered an x-ray of the left knee along with a cortisone injection but she has declined at this time.  She is hoping that her symptoms will improve after having her [name] Aria infusion administered yesterday.  She will notify us if her symptoms persist or worsen.  She has no evidence of Achilles tendinitis or plantar fasciitis at this time.  Overall she has clinically been doing well on [name] Aria infusions 2 mg/kg every 8 weeks and methotrexate 6 tablets by mouth once weekly.  She has not missed any doses of methotrexate recently.  No medication changes will be made at this time.  She was advised to notify us if she develops signs or symptoms of a flare.  Psoriasis: No active psoriasis at this time.   High risk medication use - [name] Aria IV 2 mg/kg every 8 weeks, methotrexate 6 tablets by mouth every 7 days, and folic acid 1 mg 2 tablets daily. CBC and CMP updated yesterday on 08/27/22.  TB gold pending.   Discussed the importance of holding simponi aria and methotrexate if she develops signs or symptoms of an infection and to resume once the infection has completely cleared.    Primary osteoarthritis of both hands: She has severe PIP and DIP thickening.  No synovitis or dactylitis noted.  DDD (  degenerative disc disease), cervical: Status post fusion.  Limited range of motion.  DDD (degenerative disc disease), lumbar: Intermittent discomfort in her lower back.  Raynaud's disease without gangrene: Not currently active.  No signs of sclerodactyly noted.  Osteopenia of multiple sites - 2023 DEXA scan T-score -1.6, BMD 0.817 RFN, decreased by 8.7% when compared 2021.  T-score 1.3, BMD 1.333 decreased by 12.1% when compared to 2021. She was started on fosamax after her last office visit on 05/30/22 due to a drop in BMD. She is taking fosamax 70 mg 1 tablet by mouth once weekly.   Chronic left shoulder pain - Patient presents today with increased pain in the left shoulder which started 1 to 2 months ago without any injury prior to the onset of symptoms.  On examination she has painful range of motion of the left shoulder especially with internal rotation.  She has some tenderness over the left subacromial bursa.  X-rays of the left shoulder obtained today for further evaluation.  After informed consent the left glenohumeral joint was injected with cortisone.  She tolerated the procedure well.  Procedure note was completed above.  Aftercare was discussed.  She was advised to notify us if her symptoms persist or worsen.  Plan: XR Shoulder Left  Other medical conditions are listed as follows:  History of gastroesophageal reflux (GERD)  Essential hypertension: Blood pressure was 135/69 today in the office.  History of migraine    Orders: Orders Placed This Encounter  Procedures   Large Joint Inj: L subacromial bursa   XR Shoulder Left   No orders of the defined types were placed in this  encounter.   Follow-Up Instructions: Return in 5 months (on 01/28/2023) for Psoriatic arthritis.   Gearldine Bienenstock, PA-C  Note - This record has been created using Dragon software.  Chart creation errors have been sought, but may not always  have been located. Such creation errors do not reflect on  the standard of medical care.

## 2022-08-27 ENCOUNTER — Ambulatory Visit (HOSPITAL_COMMUNITY)
Admission: RE | Admit: 2022-08-27 | Discharge: 2022-08-27 | Disposition: A | Discharge status: Home / Self Care | Payer: Medicare (Managed Care) | Source: Ambulatory Visit | Attending: Rheumatology | Admitting: Rheumatology

## 2022-08-27 ENCOUNTER — Other Ambulatory Visit: Payer: Self-pay | Admitting: Pharmacist

## 2022-08-27 DIAGNOSIS — Z111 Encounter for screening for respiratory tuberculosis: Secondary | ICD-10-CM | POA: Diagnosis present

## 2022-08-27 DIAGNOSIS — L409 Psoriasis, unspecified: Secondary | ICD-10-CM

## 2022-08-27 DIAGNOSIS — L4050 Arthropathic psoriasis, unspecified: Secondary | ICD-10-CM

## 2022-08-27 DIAGNOSIS — Z79899 Other long term (current) drug therapy: Secondary | ICD-10-CM

## 2022-08-27 DIAGNOSIS — Z5181 Encounter for therapeutic drug level monitoring: Secondary | ICD-10-CM

## 2022-08-27 LAB — CBC WITH DIFFERENTIAL/PLATELET
Abs Immature Granulocytes: 0.01 10*3/uL (ref 0.00–0.07)
Basophils Absolute: 0 10*3/uL (ref 0.0–0.1)
Basophils Relative: 1 %
Eosinophils Absolute: 0.1 10*3/uL (ref 0.0–0.5)
Eosinophils Relative: 2 %
HCT: 39.7 % (ref 36.0–46.0)
Hemoglobin: 12.7 g/dL (ref 12.0–15.0)
Immature Granulocytes: 0 %
Lymphocytes Relative: 35 %
Lymphs Abs: 2.1 10*3/uL (ref 0.7–4.0)
MCH: 31.8 pg (ref 26.0–34.0)
MCHC: 32 g/dL (ref 30.0–36.0)
MCV: 99.5 fL (ref 80.0–100.0)
Monocytes Absolute: 0.5 10*3/uL (ref 0.1–1.0)
Monocytes Relative: 9 %
Neutro Abs: 3.2 10*3/uL (ref 1.7–7.7)
Neutrophils Relative %: 53 %
Platelets: 279 10*3/uL (ref 150–400)
RBC: 3.99 MIL/uL (ref 3.87–5.11)
RDW: 13.3 % (ref 11.5–15.5)
WBC: 6 10*3/uL (ref 4.0–10.5)
nRBC: 0 % (ref 0.0–0.2)

## 2022-08-27 LAB — COMPREHENSIVE METABOLIC PANEL
ALT: 16 U/L (ref 0–44)
AST: 22 U/L (ref 15–41)
Albumin: 3.4 g/dL — ABNORMAL LOW (ref 3.5–5.0)
Alkaline Phosphatase: 58 U/L (ref 38–126)
Anion gap: 8 (ref 5–15)
BUN: 14 mg/dL (ref 8–23)
CO2: 23 mmol/L (ref 22–32)
Calcium: 8.8 mg/dL — ABNORMAL LOW (ref 8.9–10.3)
Chloride: 104 mmol/L (ref 98–111)
Creatinine, Ser: 0.5 mg/dL (ref 0.44–1.00)
GFR, Estimated: >60 mL/min (ref >60)
Glucose, Bld: 91 mg/dL (ref 70–99)
Potassium: 4 mmol/L (ref 3.5–5.1)
Sodium: 135 mmol/L (ref 135–145)
Total Bilirubin: 0.7 mg/dL (ref 0.3–1.2)
Total Protein: 6.5 g/dL (ref 6.5–8.1)

## 2022-08-27 MED ORDER — ACETAMINOPHEN 325 MG PO TABS: 650.0000 mg | ORAL_TABLET | Freq: Once | ORAL | Status: DC

## 2022-08-27 MED ORDER — SODIUM CHLORIDE 0.9 % IV SOLN
2.0000 mg/kg | INTRAVENOUS | Status: DC
  Administered 2022-08-27: 95 mg via INTRAVENOUS
  Filled 2022-08-27: qty 7.6

## 2022-08-27 NOTE — Progress Notes (Signed)
CBC is normal.  CMP shows low calcium.  Patient should take calcium supplement.  Total calcium intake should be 1200 mg between dietary and supplement.

## 2022-08-27 NOTE — Progress Notes (Signed)
Next infusion scheduled for [name] Aria on 10/22/22 and due for updated orders. Diagnosis: PsA, psoriasis -- also on MTX 15mg  po weekly  Dose: 2mg /kg every 8 weeks  Last Clinic Visit: 05/30/22 Next Clinic Visit: 08/28/22  Last infusion: 08/27/22  Labs: 08/27/2022 - stable TB Gold: negative on 08/27/22   Orders placed for [name] Aria x 2 doses along with premedication of acetaminophen and diphenhydramine to be administered 30 minutes before medication infusion.  Standing CBC with diff/platelet and CMP with GFR orders placed to be drawn every 2 months.  Next TB gold due 08/27/2023  Chesley Mires, PharmD, MPH, BCPS, CPP Clinical Pharmacist (Rheumatology and Pulmonology)

## 2022-08-28 ENCOUNTER — Ambulatory Visit: Payer: Medicare (Managed Care) | Attending: Physician Assistant | Admitting: Physician Assistant

## 2022-08-28 ENCOUNTER — Ambulatory Visit (INDEPENDENT_AMBULATORY_CARE_PROVIDER_SITE_OTHER): Payer: Medicare (Managed Care)

## 2022-08-28 ENCOUNTER — Encounter: Payer: Self-pay | Admitting: Physician Assistant

## 2022-08-28 VITALS — BP 135/69 | HR 58 | Resp 14 | Ht 60.0 in | Wt 104.0 lb

## 2022-08-28 DIAGNOSIS — M5136 Other intervertebral disc degeneration, lumbar region: Secondary | ICD-10-CM

## 2022-08-28 DIAGNOSIS — L4050 Arthropathic psoriasis, unspecified: Secondary | ICD-10-CM | POA: Diagnosis not present

## 2022-08-28 DIAGNOSIS — G8929 Other chronic pain: Secondary | ICD-10-CM | POA: Diagnosis not present

## 2022-08-28 DIAGNOSIS — M25512 Pain in left shoulder: Secondary | ICD-10-CM

## 2022-08-28 DIAGNOSIS — L409 Psoriasis, unspecified: Secondary | ICD-10-CM | POA: Diagnosis not present

## 2022-08-28 DIAGNOSIS — M8589 Other specified disorders of bone density and structure, multiple sites: Secondary | ICD-10-CM

## 2022-08-28 DIAGNOSIS — M19042 Primary osteoarthritis, left hand: Secondary | ICD-10-CM

## 2022-08-28 DIAGNOSIS — Z8719 Personal history of other diseases of the digestive system: Secondary | ICD-10-CM

## 2022-08-28 DIAGNOSIS — M19041 Primary osteoarthritis, right hand: Secondary | ICD-10-CM

## 2022-08-28 DIAGNOSIS — I10 Essential (primary) hypertension: Secondary | ICD-10-CM

## 2022-08-28 DIAGNOSIS — Z79899 Other long term (current) drug therapy: Secondary | ICD-10-CM | POA: Diagnosis not present

## 2022-08-28 DIAGNOSIS — M5030 Other cervical disc degeneration, unspecified cervical region: Secondary | ICD-10-CM

## 2022-08-28 DIAGNOSIS — I7300 Raynaud's syndrome without gangrene: Secondary | ICD-10-CM

## 2022-08-28 DIAGNOSIS — Z8669 Personal history of other diseases of the nervous system and sense organs: Secondary | ICD-10-CM

## 2022-08-28 MED ORDER — TRIAMCINOLONE ACETONIDE 40 MG/ML IJ SUSP
40.0000 mg | INTRAMUSCULAR | Status: AC | PRN
  Administered 2022-08-28: 40 mg via INTRA_ARTICULAR

## 2022-08-28 MED ORDER — LIDOCAINE HCL 1 % IJ SOLN
1.0000 mL | INTRAMUSCULAR | Status: AC | PRN
  Administered 2022-08-28: 1 mL

## 2022-08-28 NOTE — Patient Instructions (Signed)

## 2022-09-01 LAB — QUANTIFERON-TB GOLD PLUS (RQFGPL)
QuantiFERON Mitogen Value: >10 IU/mL
QuantiFERON Nil Value: 0.01 IU/mL
QuantiFERON TB1 Ag Value: 0.01 IU/mL
QuantiFERON TB2 Ag Value: 0.01 IU/mL

## 2022-09-01 LAB — QUANTIFERON-TB GOLD PLUS: QuantiFERON-TB Gold Plus: NEGATIVE

## 2022-09-02 NOTE — Progress Notes (Signed)
TB gold negative

## 2022-09-11 ENCOUNTER — Telehealth: Payer: Self-pay | Admitting: *Deleted

## 2022-09-11 NOTE — Telephone Encounter (Signed)
She may postpone her infusion by a week.

## 2022-09-11 NOTE — Telephone Encounter (Signed)
Patient's husband called the office and left message stating Amy Hayes has an infusion scheduled for October 22, 2022. Amy Hayes states Amy Hayes is scheduled for an ablation of nerves of her lower back at the surgical center on 10/21/2022. They would like to know if she should postpone her infusion. Please advise.

## 2022-09-11 NOTE — Telephone Encounter (Signed)
Robert advised Amy Hayes may postpone her infusion by a week. He verbalized understanding and will call to reschedule her infusion.

## 2022-09-14 ENCOUNTER — Other Ambulatory Visit: Payer: Self-pay | Admitting: Rheumatology

## 2022-09-17 NOTE — Telephone Encounter (Signed)
Last Fill: 06/05/2022  Labs: 08/27/2022 CBC is normal.  CMP shows low calcium.  Patient should take calcium supplement.  Total calcium intake should be 1200 mg between dietary and supplement.   Next Visit: 02/11/2023  Last Visit: 08/28/2022  DX: Psoriatic arthropathy   Current Dose per office note on 08/28/2022: methotrexate 6 tablets by mouth every 7 days   Okay to refill Methotrexate?

## 2022-10-04 ENCOUNTER — Other Ambulatory Visit: Payer: Self-pay | Admitting: Physician Assistant

## 2022-10-04 NOTE — Telephone Encounter (Signed)
Last Fill: 09/18/2021  Next Visit: 02/11/2023  Last Visit: 08/28/2022  Dx: Psoriatic arthropathy   Current Dose per office note on 08/28/2022: folic acid 1 mg 2 tablets daily.   Okay to refill Folic Acid?

## 2022-10-19 ENCOUNTER — Other Ambulatory Visit: Payer: Self-pay | Admitting: Rheumatology

## 2022-10-21 NOTE — Telephone Encounter (Signed)
Last Fill: 05/18/2022  Next Visit: 02/11/2023  Last Visit: 08/28/2022  Dx: DDD (degenerative disc disease), lumbar   Current Dose per office note on 08/28/2022: not discussed  Okay to refill Lidoderm?

## 2022-10-22 ENCOUNTER — Encounter (HOSPITAL_COMMUNITY): Payer: Medicare (Managed Care)

## 2022-10-29 ENCOUNTER — Ambulatory Visit (HOSPITAL_COMMUNITY)
Admission: RE | Admit: 2022-10-29 | Discharge: 2022-10-29 | Disposition: A | Discharge status: Home / Self Care | Payer: Medicare (Managed Care) | Source: Ambulatory Visit | Attending: Rheumatology | Admitting: Rheumatology

## 2022-10-29 DIAGNOSIS — L409 Psoriasis, unspecified: Secondary | ICD-10-CM | POA: Insufficient documentation

## 2022-10-29 DIAGNOSIS — L4050 Arthropathic psoriasis, unspecified: Secondary | ICD-10-CM | POA: Diagnosis not present

## 2022-10-29 DIAGNOSIS — Z79899 Other long term (current) drug therapy: Secondary | ICD-10-CM | POA: Diagnosis present

## 2022-10-29 LAB — COMPREHENSIVE METABOLIC PANEL
ALT: 19 U/L (ref 0–44)
AST: 35 U/L (ref 15–41)
Albumin: 3.7 g/dL (ref 3.5–5.0)
Alkaline Phosphatase: 47 U/L (ref 38–126)
Anion gap: 10 (ref 5–15)
BUN: 18 mg/dL (ref 8–23)
CO2: 24 mmol/L (ref 22–32)
Calcium: 9 mg/dL (ref 8.9–10.3)
Chloride: 101 mmol/L (ref 98–111)
Creatinine, Ser: 0.64 mg/dL (ref 0.44–1.00)
GFR, Estimated: >60 mL/min (ref >60)
Glucose, Bld: 86 mg/dL (ref 70–99)
Potassium: 4.7 mmol/L (ref 3.5–5.1)
Sodium: 135 mmol/L (ref 135–145)
Total Bilirubin: 0.6 mg/dL (ref 0.3–1.2)
Total Protein: 6.9 g/dL (ref 6.5–8.1)

## 2022-10-29 LAB — CBC WITH DIFFERENTIAL/PLATELET
Abs Immature Granulocytes: 0.02 10*3/uL (ref 0.00–0.07)
Basophils Absolute: 0.1 10*3/uL (ref 0.0–0.1)
Basophils Relative: 1 %
Eosinophils Absolute: 0.3 10*3/uL (ref 0.0–0.5)
Eosinophils Relative: 3 %
HCT: 42.3 % (ref 36.0–46.0)
Hemoglobin: 14 g/dL (ref 12.0–15.0)
Immature Granulocytes: 0 %
Lymphocytes Relative: 24 %
Lymphs Abs: 1.9 10*3/uL (ref 0.7–4.0)
MCH: 32.6 pg (ref 26.0–34.0)
MCHC: 33.1 g/dL (ref 30.0–36.0)
MCV: 98.4 fL (ref 80.0–100.0)
Monocytes Absolute: 1 10*3/uL (ref 0.1–1.0)
Monocytes Relative: 13 %
Neutro Abs: 4.6 10*3/uL (ref 1.7–7.7)
Neutrophils Relative %: 59 %
Platelets: 307 10*3/uL (ref 150–400)
RBC: 4.3 MIL/uL (ref 3.87–5.11)
RDW: 13.8 % (ref 11.5–15.5)
WBC: 7.9 10*3/uL (ref 4.0–10.5)
nRBC: 0 % (ref 0.0–0.2)

## 2022-10-29 MED ORDER — DIPHENHYDRAMINE HCL 25 MG PO CAPS: 25.0000 mg | ORAL_CAPSULE | ORAL | Status: DC

## 2022-10-29 MED ORDER — SODIUM CHLORIDE 0.9 % IV SOLN
2.0000 mg/kg | INTRAVENOUS | Status: DC
  Administered 2022-10-29: 95 mg via INTRAVENOUS
  Filled 2022-10-29: qty 7.6

## 2022-10-29 MED ORDER — ACETAMINOPHEN 325 MG PO TABS: 650.0000 mg | ORAL_TABLET | ORAL | Status: DC

## 2022-10-29 NOTE — Progress Notes (Signed)
CBC and CMP WNL

## 2022-11-29 ENCOUNTER — Other Ambulatory Visit: Payer: Self-pay | Admitting: Rheumatology

## 2022-11-29 ENCOUNTER — Encounter: Payer: Self-pay | Admitting: Rheumatology

## 2022-11-29 MED ORDER — METHOTREXATE SODIUM 2.5 MG PO TABS: 15.0000 mg | ORAL_TABLET | ORAL | 0 refills | Status: DC

## 2022-11-29 NOTE — Telephone Encounter (Signed)
Last Fill: 09/17/2022  Labs: 10/29/2022 CBC and CMP WNL   Next Visit: 02/11/2023  Last Visit:  08/28/2022   DX: Psoriatic arthropathy   Current Dose per office note 08/28/2022: methotrexate 6 tablets by mouth every 7 days   Okay to refill Methotrexate?

## 2022-12-24 ENCOUNTER — Ambulatory Visit (HOSPITAL_COMMUNITY)
Admission: RE | Admit: 2022-12-24 | Discharge: 2022-12-24 | Disposition: A | Discharge status: Home / Self Care | Payer: Medicare (Managed Care) | Source: Ambulatory Visit | Attending: Rheumatology | Admitting: Rheumatology

## 2022-12-24 VITALS — BP 133/63 | HR 55 | Resp 18 | Wt 103.0 lb

## 2022-12-24 DIAGNOSIS — M19041 Primary osteoarthritis, right hand: Secondary | ICD-10-CM | POA: Insufficient documentation

## 2022-12-24 DIAGNOSIS — L409 Psoriasis, unspecified: Secondary | ICD-10-CM | POA: Diagnosis present

## 2022-12-24 DIAGNOSIS — L4050 Arthropathic psoriasis, unspecified: Secondary | ICD-10-CM | POA: Insufficient documentation

## 2022-12-24 DIAGNOSIS — Z79899 Other long term (current) drug therapy: Secondary | ICD-10-CM | POA: Insufficient documentation

## 2022-12-24 DIAGNOSIS — M19042 Primary osteoarthritis, left hand: Secondary | ICD-10-CM | POA: Diagnosis present

## 2022-12-24 DIAGNOSIS — M5030 Other cervical disc degeneration, unspecified cervical region: Secondary | ICD-10-CM | POA: Diagnosis present

## 2022-12-24 LAB — COMPREHENSIVE METABOLIC PANEL
ALT: 17 U/L (ref 0–44)
AST: 24 U/L (ref 15–41)
Albumin: 3.9 g/dL (ref 3.5–5.0)
Alkaline Phosphatase: 53 U/L (ref 38–126)
Anion gap: 10 (ref 5–15)
BUN: 21 mg/dL (ref 8–23)
CO2: 25 mmol/L (ref 22–32)
Calcium: 9.2 mg/dL (ref 8.9–10.3)
Chloride: 101 mmol/L (ref 98–111)
Creatinine, Ser: 0.63 mg/dL (ref 0.44–1.00)
GFR, Estimated: >60 mL/min (ref >60)
Glucose, Bld: 92 mg/dL (ref 70–99)
Potassium: 4.1 mmol/L (ref 3.5–5.1)
Sodium: 136 mmol/L (ref 135–145)
Total Bilirubin: 1.1 mg/dL (ref 0.3–1.2)
Total Protein: 7.2 g/dL (ref 6.5–8.1)

## 2022-12-24 LAB — CBC WITH DIFFERENTIAL/PLATELET
Abs Immature Granulocytes: 0.04 10*3/uL (ref 0.00–0.07)
Basophils Absolute: 0.1 10*3/uL (ref 0.0–0.1)
Basophils Relative: 1 %
Eosinophils Absolute: 0.2 10*3/uL (ref 0.0–0.5)
Eosinophils Relative: 2 %
HCT: 41.8 % (ref 36.0–46.0)
Hemoglobin: 13.6 g/dL (ref 12.0–15.0)
Immature Granulocytes: 0 %
Lymphocytes Relative: 28 %
Lymphs Abs: 3 10*3/uL (ref 0.7–4.0)
MCH: 31.8 pg (ref 26.0–34.0)
MCHC: 32.5 g/dL (ref 30.0–36.0)
MCV: 97.7 fL (ref 80.0–100.0)
Monocytes Absolute: 1.2 10*3/uL — ABNORMAL HIGH (ref 0.1–1.0)
Monocytes Relative: 11 %
Neutro Abs: 6.3 10*3/uL (ref 1.7–7.7)
Neutrophils Relative %: 58 %
Platelets: 339 10*3/uL (ref 150–400)
RBC: 4.28 MIL/uL (ref 3.87–5.11)
RDW: 14.3 % (ref 11.5–15.5)
WBC: 10.8 10*3/uL — ABNORMAL HIGH (ref 4.0–10.5)
nRBC: 0 % (ref 0.0–0.2)

## 2022-12-24 MED ORDER — DIPHENHYDRAMINE HCL 25 MG PO CAPS: 25.0000 mg | ORAL_CAPSULE | ORAL | Status: DC

## 2022-12-24 MED ORDER — SODIUM CHLORIDE 0.9 % IV SOLN
2.0000 mg/kg | INTRAVENOUS | Status: AC
  Administered 2022-12-24: 93.75 mg via INTRAVENOUS
  Filled 2022-12-24: qty 7.5

## 2022-12-24 MED ORDER — ACETAMINOPHEN 325 MG PO TABS: 650.0000 mg | ORAL_TABLET | ORAL | Status: DC

## 2022-12-25 ENCOUNTER — Other Ambulatory Visit: Payer: Self-pay | Admitting: Pharmacist

## 2022-12-25 DIAGNOSIS — L4050 Arthropathic psoriasis, unspecified: Secondary | ICD-10-CM

## 2022-12-25 DIAGNOSIS — Z79899 Other long term (current) drug therapy: Secondary | ICD-10-CM

## 2022-12-25 DIAGNOSIS — L409 Psoriasis, unspecified: Secondary | ICD-10-CM

## 2022-12-25 NOTE — Progress Notes (Signed)
Next infusion scheduled for [name] Aria [id number]) on 02/18/23 and due for updated orders. Diagnosis: PsA, psoriasis - also on MTX  Dose: 2mg /kg every 8 weeks  Last Clinic Visit: 08/28/22 Next Clinic Visit: 02/12/23  Last infusion: 12/24/22  Labs: CBC and CMP on 12/24/22 TB Gold: negative on 08/27/2022   Orders placed for [name] Aria (J1602) x 2 doses along with premedication of acetaminophen and diphenhydramine to be administered 30 minutes before medication infusion.  Standing CBC with diff/platelet and CMP with GFR orders placed to be drawn every 2 months.  Next TB gold due 08/27/2023  Chesley Mires, PharmD, MPH, BCPS, CPP Clinical Pharmacist (Rheumatology and Pulmonology)

## 2023-01-29 NOTE — Progress Notes (Unsigned)
Office Visit Note  Patient: Amy Hayes             Date of Birth: Nov 12, 1945           MRN: [id number]             PCP: Milda Smart, MD Referring: Milda Smart, MD Visit Date: 02/12/2023 Occupation: @GUAROCC @  Subjective:  Medication monitoring   History of Present Illness: Amy Hayes is a 77 y.o. female with history of psoriatic arthritis and DDD. Patient remains on  [name] Aria IV 2 mg/kg every 8 weeks, methotrexate 6 tablets by mouth every 7 days, and folic acid 1 mg 2 tablets daily.  She is tolerating combination therapy without any side effects.  She has not had any interruptions in therapy recently.  She denies any recent psoriatic arthritis flares.  She has some dryness on the extensor surface of her elbows but denies any active psoriasis at this time.  She denies any Achilles tendinitis or plantar fasciitis.  She continues to have chronic pain in her lower back.  She is due for an ablation soon.  She denies any recent falls or fractures.  She has been taking a calcium vitamin D supplement daily.  She had an updated bone density on 12/18/2022 which was within the osteopenia range.  Patient states she took Fosamax for 2 months but discontinued due to experiencing tooth and jaw pain.   She denies any recent or recurrent infections.  Patient had the annual flu shot and COVID vaccine on Monday.  She is planning on getting the RSV vaccine in November.   Activities of Daily Living:  Patient reports morning stiffness for all day. Patient Denies nocturnal pain.  Difficulty dressing/grooming: Denies Difficulty climbing stairs: Denies Difficulty getting out of chair: Denies Difficulty using hands for taps, buttons, cutlery, and/or writing: Reports  Review of Systems  Constitutional:  Positive for fatigue.  HENT:  Negative for mouth sores and mouth dryness.   Eyes:  Negative for dryness.  Respiratory:  Negative for shortness of breath.   Cardiovascular:  Negative for chest  pain and palpitations.  Gastrointestinal:  Negative for blood in stool, constipation and diarrhea.  Endocrine: Negative for increased urination.  Genitourinary:  Negative for involuntary urination.  Musculoskeletal:  Positive for joint pain, joint pain, joint swelling, myalgias, morning stiffness, muscle tenderness and myalgias. Negative for gait problem and muscle weakness.  Skin:  Negative for color change, rash, hair loss and sensitivity to sunlight.  Allergic/Immunologic: Negative for susceptible to infections.  Neurological:  Negative for dizziness and headaches.  Hematological:  Positive for bruising/bleeding tendency. Negative for swollen glands.  Psychiatric/Behavioral:  Negative for depressed mood and sleep disturbance. The patient is not nervous/anxious.     PMFS History:  Patient Active Problem List   Diagnosis Date Noted   Essential hypertension 12/23/2019   Pain in both lower extremities 12/23/2019   Psoriatic arthropathy (HCC) 12/22/2018   DDD (degenerative disc disease), lumbar 07/24/2017   Primary osteoarthritis of both hands 03/04/2017   DDD (degenerative disc disease), cervical s/p Fusion  10/30/2016   Raynaud's disease without gangrene 10/30/2016   History of migraine 10/30/2016   History of gastroesophageal reflux (GERD) 10/30/2016   High risk medication use 10/30/2016   Psoriasis 05/02/2016   Bilateral low back pain with sciatica 05/02/2016   Osteopenia 05/02/2016    Past Medical History:  Diagnosis Date   Arthritis    GERD (gastroesophageal reflux disease)    Hyperlipidemia  Hypertension    Migraine    Osteoporosis     Family History  Problem Relation Age of Onset   Leukemia Mother    Stroke Father    Hypertension Sister    Hypertension Brother    Stroke Brother    Past Surgical History:  Procedure Laterality Date   ABDOMINAL HYSTERECTOMY     CARPAL TUNNEL RELEASE     CERVICAL FUSION     EYE SURGERY     ingrown toenail removal     KNEE  ARTHROSCOPY Right    KNEE CARTILAGE SURGERY     ROTATOR CUFF REPAIR     SPINAL FUSION     cervical fusion   Social History   Social History Narrative   Not on file   Immunization History  Administered Date(s) Administered   Influenza Split 02/15/2010   Influenza, High Dose Seasonal PF 02/06/2015, 01/23/2016, 01/20/2017, 02/20/2018   Influenza, Seasonal, Injecte, Preservative Fre 02/11/2011, 01/04/2013, 04/05/2014   Influenza,trivalent, recombinat, inj, PF 01/31/2012   Influenza-Unspecified 02/10/2023   PFIZER(Purple Top)SARS-COV-2 Vaccination 07/05/2019, 07/26/2019, 12/17/2019, 07/04/2020, 03/07/2021   Pneumococcal Conjugate-13 08/11/2014   Pneumococcal Polysaccharide-23 05/29/2009, 08/29/2015   Td 07/06/2018   Tdap 12/17/2007   Unspecified SARS-COV-2 Vaccination 02/10/2023   Zoster Recombinant(Shingrix) 02/27/2018, 06/12/2018     Objective: Vital Signs: BP 132/73 (BP Location: Left Arm, Patient Position: Sitting, Cuff Size: Normal)   Pulse (!) 57   Resp 14   Ht 5' (1.524 m)   Wt 106 lb 3.2 oz (48.2 kg)   BMI 20.74 kg/m    Physical Exam Vitals and nursing note reviewed.  Constitutional:      Appearance: She is well-developed.  HENT:     Head: Normocephalic and atraumatic.  Eyes:     Conjunctiva/sclera: Conjunctivae normal.  Cardiovascular:     Rate and Rhythm: Normal rate and regular rhythm.     Heart sounds: Normal heart sounds.  Pulmonary:     Effort: Pulmonary effort is normal.     Breath sounds: Normal breath sounds.  Abdominal:     General: Bowel sounds are normal.     Palpations: Abdomen is soft.  Musculoskeletal:     Cervical back: Normal range of motion.  Lymphadenopathy:     Cervical: No cervical adenopathy.  Skin:    General: Skin is warm and dry.     Capillary Refill: Capillary refill takes less than 2 seconds.  Neurological:     Mental Status: She is alert and oriented to person, place, and time.  Psychiatric:        Behavior: Behavior  normal.      Musculoskeletal Exam: C-spine has limited range of motion.  Shoulder joints have good range of motion.  Elbow joints and wrist joints have good range of motion.  Severe PIP and DIP thickening.  Subluxation of several DIP joints.  Mucinous cyst noted over the DIP of the right third digit.  Tenderness of the left third and fourth PIP joints.  Hip joints have good range of motion with no groin pain.  Knee joints have good range of motion no warmth or effusion.  Ankle joints have good range of motion with no tenderness or joint swelling.  CDAI Exam: CDAI Score: -- Patient Global: --; Provider Global: -- Swollen: --; Tender: -- Joint Exam 02/12/2023   No joint exam has been documented for this visit   There is currently no information documented on the homunculus. Go to the Rheumatology activity and complete the homunculus joint  exam.  Investigation: No additional findings.  Imaging: No results found.  Recent Labs: Lab Results  Component Value Date   WBC 10.8 (H) 12/24/2022   HGB 13.6 12/24/2022   PLT 339 12/24/2022   NA 136 12/24/2022   K 4.1 12/24/2022   CL 101 12/24/2022   CO2 25 12/24/2022   GLUCOSE 92 12/24/2022   BUN 21 12/24/2022   CREATININE 0.63 12/24/2022   BILITOT 1.1 12/24/2022   ALKPHOS 53 12/24/2022   AST 24 12/24/2022   ALT 17 12/24/2022   PROT 7.2 12/24/2022   ALBUMIN 3.9 12/24/2022   CALCIUM 9.2 12/24/2022   GFRAA >60 12/09/2019   QFTBGOLDPLUS Negative 08/27/2022    Speciality Comments: [name] Aria infusions every 8 weeks, started on 11/2016 TB gold negative 08/01/16  Procedures:  No procedures performed Allergies: Codeine   Assessment / Plan:     Visit Diagnoses: Psoriatic arthropathy (HCC): No synovitis or dactylitis or dactylitis noted on examination today.  She has not had any signs or symptoms of a psoriatic arthritis flare.  She has no active psoriasis at this time.  No evidence of Achilles tendinitis or plantar fasciitis.  No SI  joint pain currently.  Patient remains on [name] Aria 2 mg/kg IV infusions every 8 weeks and methotrexate 6 tablets by mouth once weekly.  She is tolerating combination therapy and has not had any interruptions in therapy recently.  No recent or recurrent infections.  No medication changes will be made at this time.  She was advised to notify us if she develops any new or worsening symptoms.  She will follow-up in the office in 5 months or sooner if needed.  Psoriasis: No active psoriasis at this time.  High risk medication use - [name] Aria IV 2 mg/kg every 8 weeks, methotrexate 6 tablets by mouth every 7 days, and folic acid 1 mg 2 tablets daily. CBC and CMP updated on 12/24/22.   TB gold negative on 08/27/22.  No recent or recurrent infections. Discussed the importance of holding simponi aria and methotrexate if she develops signs or symptoms of an infection and to resume once the infection has completely cleared.  She received the annual flu shot and COVID-vaccine on Monday.  She is planning on receiving the RSV vaccine in November 2024.  Primary osteoarthritis of both hands: Severe PIP and DIP thickening.  Subluxation of DIP joints.  Mucinous cyst noted in the DIP of the right third digit.  No active synovitis noted today.  DDD (degenerative disc disease), cervical: C-spine has limited range of motion.  Degeneration of intervertebral disc of lumbar region without discogenic back pain or lower extremity pain: Chronic pain.  Planning to proceed with a repeat ablation soon.  Raynaud's disease without gangrene: Not currently symptomatic.  No signs of sclerodactyly noted.  Osteopenia of multiple sites - 2023 DEXA T-score -1.6, BMD 0.817 RFN,-8.7% compared 2021.T-score 1.3, BMD 1.333 -12.1% compared to 2021. Patient was started on Fosamax in mid February 2024 and took it for 2 months but discontinued due to experiencing jaw and dental pain.  She does not plan to resume Fosamax.  Patient had a repeat  DEXA on 12/18/2022: Left femoral neck BMD 0.777 with T-score -1.8.  Right femoral neck BMD 0.807 with T-score -1.7.  Total mean change from previous: No significant interval change. Patient plans on continuing calcium and vitamin D supplementation.  Discussed the importance of resistive exercises.  She has not had any recent falls or fractures.  Other medical conditions  are listed as follows:   History of gastroesophageal reflux (GERD)  Essential hypertension: Blood pressure was 132/73 today in the office.  History of migraine  Chronic left shoulder pain - left glenohumeral joint injected with cortisone 08/28/22. Improved.  Good ROM on examination today.   Orders: No orders of the defined types were placed in this encounter.  No orders of the defined types were placed in this encounter.    Follow-Up Instructions: Return in about 5 months (around 07/13/2023) for Psoriatic arthritis.   Gearldine Bienenstock, PA-C  Note - This record has been created using Dragon software.  Chart creation errors have been sought, but may not always  have been located. Such creation errors do not reflect on  the standard of medical care.

## 2023-02-11 ENCOUNTER — Ambulatory Visit: Payer: Medicare (Managed Care) | Admitting: Rheumatology

## 2023-02-12 ENCOUNTER — Ambulatory Visit: Payer: Medicare (Managed Care) | Attending: Rheumatology | Admitting: Physician Assistant

## 2023-02-12 ENCOUNTER — Encounter: Payer: Self-pay | Admitting: Physician Assistant

## 2023-02-12 VITALS — BP 132/73 | HR 57 | Resp 14 | Ht 60.0 in | Wt 106.2 lb

## 2023-02-12 DIAGNOSIS — M8589 Other specified disorders of bone density and structure, multiple sites: Secondary | ICD-10-CM

## 2023-02-12 DIAGNOSIS — M5030 Other cervical disc degeneration, unspecified cervical region: Secondary | ICD-10-CM

## 2023-02-12 DIAGNOSIS — L409 Psoriasis, unspecified: Secondary | ICD-10-CM | POA: Diagnosis not present

## 2023-02-12 DIAGNOSIS — I10 Essential (primary) hypertension: Secondary | ICD-10-CM

## 2023-02-12 DIAGNOSIS — Z8719 Personal history of other diseases of the digestive system: Secondary | ICD-10-CM

## 2023-02-12 DIAGNOSIS — L4050 Arthropathic psoriasis, unspecified: Secondary | ICD-10-CM

## 2023-02-12 DIAGNOSIS — I7300 Raynaud's syndrome without gangrene: Secondary | ICD-10-CM

## 2023-02-12 DIAGNOSIS — Z8669 Personal history of other diseases of the nervous system and sense organs: Secondary | ICD-10-CM

## 2023-02-12 DIAGNOSIS — G8929 Other chronic pain: Secondary | ICD-10-CM

## 2023-02-12 DIAGNOSIS — M51369 Other intervertebral disc degeneration, lumbar region without mention of lumbar back pain or lower extremity pain: Secondary | ICD-10-CM

## 2023-02-12 DIAGNOSIS — Z79899 Other long term (current) drug therapy: Secondary | ICD-10-CM

## 2023-02-12 DIAGNOSIS — Z5181 Encounter for therapeutic drug level monitoring: Secondary | ICD-10-CM

## 2023-02-12 DIAGNOSIS — M19041 Primary osteoarthritis, right hand: Secondary | ICD-10-CM

## 2023-02-12 DIAGNOSIS — M25512 Pain in left shoulder: Secondary | ICD-10-CM

## 2023-02-12 DIAGNOSIS — M19042 Primary osteoarthritis, left hand: Secondary | ICD-10-CM

## 2023-02-14 ENCOUNTER — Other Ambulatory Visit: Payer: Self-pay | Admitting: Rheumatology

## 2023-02-17 MED ORDER — METHOTREXATE SODIUM 2.5 MG PO TABS: 15.0000 mg | ORAL_TABLET | ORAL | 0 refills | Status: DC

## 2023-02-17 NOTE — Telephone Encounter (Signed)
Last Fill: 11/29/2022  Labs: 12/24/2022 WBC count is borderline elevated. Absolute monocytes are slightly elevated. Rest of CBC WNL.   CMP WNL   Next Visit: 07/17/2023  Last Visit: 02/12/2023  DX: Psoriatic arthropathy   Current Dose per office note 02/12/2023: methotrexate 6 tablets by mouth every 7 days,   Okay to refill Methotrexate?

## 2023-02-18 ENCOUNTER — Inpatient Hospital Stay (HOSPITAL_COMMUNITY): Admission: RE | Admit: 2023-02-18 | Payer: Medicare (Managed Care) | Source: Ambulatory Visit

## 2023-02-19 ENCOUNTER — Ambulatory Visit (HOSPITAL_COMMUNITY)
Admission: RE | Admit: 2023-02-19 | Discharge: 2023-02-19 | Disposition: A | Discharge status: Home / Self Care | Payer: Medicare (Managed Care) | Source: Ambulatory Visit | Attending: Rheumatology | Admitting: Rheumatology

## 2023-02-19 DIAGNOSIS — L409 Psoriasis, unspecified: Secondary | ICD-10-CM | POA: Insufficient documentation

## 2023-02-19 DIAGNOSIS — L4050 Arthropathic psoriasis, unspecified: Secondary | ICD-10-CM | POA: Insufficient documentation

## 2023-02-19 DIAGNOSIS — Z79899 Other long term (current) drug therapy: Secondary | ICD-10-CM | POA: Insufficient documentation

## 2023-02-19 LAB — CBC WITH DIFFERENTIAL/PLATELET
Abs Immature Granulocytes: 0.01 10*3/uL (ref 0.00–0.07)
Basophils Absolute: 0 10*3/uL (ref 0.0–0.1)
Basophils Relative: 1 %
Eosinophils Absolute: 0.2 10*3/uL (ref 0.0–0.5)
Eosinophils Relative: 3 %
HCT: 41.3 % (ref 36.0–46.0)
Hemoglobin: 13.4 g/dL (ref 12.0–15.0)
Immature Granulocytes: 0 %
Lymphocytes Relative: 30 %
Lymphs Abs: 2 10*3/uL (ref 0.7–4.0)
MCH: 31.5 pg (ref 26.0–34.0)
MCHC: 32.4 g/dL (ref 30.0–36.0)
MCV: 96.9 fL (ref 80.0–100.0)
Monocytes Absolute: 0.7 10*3/uL (ref 0.1–1.0)
Monocytes Relative: 11 %
Neutro Abs: 3.8 10*3/uL (ref 1.7–7.7)
Neutrophils Relative %: 55 %
Platelets: 281 10*3/uL (ref 150–400)
RBC: 4.26 MIL/uL (ref 3.87–5.11)
RDW: 13.9 % (ref 11.5–15.5)
WBC: 6.8 10*3/uL (ref 4.0–10.5)
nRBC: 0 % (ref 0.0–0.2)

## 2023-02-19 LAB — COMPREHENSIVE METABOLIC PANEL
ALT: 16 U/L (ref 0–44)
AST: 23 U/L (ref 15–41)
Albumin: 3.7 g/dL (ref 3.5–5.0)
Alkaline Phosphatase: 50 U/L (ref 38–126)
Anion gap: 13 (ref 5–15)
BUN: 16 mg/dL (ref 8–23)
CO2: 24 mmol/L (ref 22–32)
Calcium: 9.4 mg/dL (ref 8.9–10.3)
Chloride: 101 mmol/L (ref 98–111)
Creatinine, Ser: 0.54 mg/dL (ref 0.44–1.00)
GFR, Estimated: >60 mL/min (ref >60)
Glucose, Bld: 102 mg/dL — ABNORMAL HIGH (ref 70–99)
Potassium: 4.3 mmol/L (ref 3.5–5.1)
Sodium: 138 mmol/L (ref 135–145)
Total Bilirubin: 0.7 mg/dL (ref 0.3–1.2)
Total Protein: 6.9 g/dL (ref 6.5–8.1)

## 2023-02-19 MED ORDER — SODIUM CHLORIDE 0.9 % IV SOLN
2.0000 mg/kg | INTRAVENOUS | Status: DC
  Administered 2023-02-19: 95 mg via INTRAVENOUS
  Filled 2023-02-19: qty 7.6

## 2023-02-19 MED ORDER — ACETAMINOPHEN 325 MG PO TABS: 650.0000 mg | ORAL_TABLET | ORAL | Status: DC

## 2023-02-19 MED ORDER — DIPHENHYDRAMINE HCL 25 MG PO CAPS: 25.0000 mg | ORAL_CAPSULE | ORAL | Status: DC

## 2023-02-19 NOTE — Progress Notes (Signed)
CMP WNL

## 2023-02-19 NOTE — Progress Notes (Signed)
CBC WNL

## 2023-03-21 ENCOUNTER — Other Ambulatory Visit: Payer: Self-pay | Admitting: Physician Assistant

## 2023-03-24 NOTE — Telephone Encounter (Signed)
Last Fill: 10/21/2022  Next Visit: 07/17/2023  Last Visit: 02/12/2023  Dx: Degeneration of intervertebral disc of lumbar region without discogenic back pain or lower extremity pain  Current Dose per office note on 02/12/2023: not dicussed   Okay to refill Lidoderm?

## 2023-03-31 ENCOUNTER — Encounter: Payer: Self-pay | Admitting: Rheumatology

## 2023-04-04 ENCOUNTER — Encounter: Payer: Self-pay | Admitting: Rheumatology

## 2023-04-07 NOTE — Telephone Encounter (Signed)
Will await new insurance cards to run BIV for [employer]

## 2023-04-15 ENCOUNTER — Encounter (HOSPITAL_COMMUNITY): Payer: Medicare (Managed Care)

## 2023-04-17 ENCOUNTER — Ambulatory Visit (HOSPITAL_COMMUNITY)
Admission: RE | Admit: 2023-04-17 | Discharge: 2023-04-17 | Disposition: A | Discharge status: Home / Self Care | Payer: Medicare (Managed Care) | Source: Ambulatory Visit | Attending: Rheumatology | Admitting: Rheumatology

## 2023-04-17 ENCOUNTER — Other Ambulatory Visit: Payer: Self-pay | Admitting: Pharmacist

## 2023-04-17 DIAGNOSIS — Z111 Encounter for screening for respiratory tuberculosis: Secondary | ICD-10-CM

## 2023-04-17 DIAGNOSIS — L409 Psoriasis, unspecified: Secondary | ICD-10-CM | POA: Insufficient documentation

## 2023-04-17 DIAGNOSIS — L4050 Arthropathic psoriasis, unspecified: Secondary | ICD-10-CM | POA: Insufficient documentation

## 2023-04-17 DIAGNOSIS — Z79899 Other long term (current) drug therapy: Secondary | ICD-10-CM | POA: Insufficient documentation

## 2023-04-17 LAB — CBC WITH DIFFERENTIAL/PLATELET
Abs Immature Granulocytes: 0.03 10*3/uL (ref 0.00–0.07)
Basophils Absolute: 0.1 10*3/uL (ref 0.0–0.1)
Basophils Relative: 1 %
Eosinophils Absolute: 0.1 10*3/uL (ref 0.0–0.5)
Eosinophils Relative: 2 %
HCT: 40.9 % (ref 36.0–46.0)
Hemoglobin: 13.5 g/dL (ref 12.0–15.0)
Immature Granulocytes: 0 %
Lymphocytes Relative: 29 %
Lymphs Abs: 2.1 10*3/uL (ref 0.7–4.0)
MCH: 33.1 pg (ref 26.0–34.0)
MCHC: 33 g/dL (ref 30.0–36.0)
MCV: 100.2 fL — ABNORMAL HIGH (ref 80.0–100.0)
Monocytes Absolute: 0.8 10*3/uL (ref 0.1–1.0)
Monocytes Relative: 12 %
Neutro Abs: 4 10*3/uL (ref 1.7–7.7)
Neutrophils Relative %: 56 %
Platelets: 350 10*3/uL (ref 150–400)
RBC: 4.08 MIL/uL (ref 3.87–5.11)
RDW: 13.3 % (ref 11.5–15.5)
WBC: 7.1 10*3/uL (ref 4.0–10.5)
nRBC: 0 % (ref 0.0–0.2)

## 2023-04-17 LAB — COMPREHENSIVE METABOLIC PANEL
ALT: 21 U/L (ref 0–44)
AST: 28 U/L (ref 15–41)
Albumin: 3.6 g/dL (ref 3.5–5.0)
Alkaline Phosphatase: 45 U/L (ref 38–126)
Anion gap: 10 (ref 5–15)
BUN: 20 mg/dL (ref 8–23)
CO2: 24 mmol/L (ref 22–32)
Calcium: 9.2 mg/dL (ref 8.9–10.3)
Chloride: 102 mmol/L (ref 98–111)
Creatinine, Ser: 0.43 mg/dL — ABNORMAL LOW (ref 0.44–1.00)
GFR, Estimated: >60 mL/min (ref >60)
Glucose, Bld: 112 mg/dL — ABNORMAL HIGH (ref 70–99)
Potassium: 4.1 mmol/L (ref 3.5–5.1)
Sodium: 136 mmol/L (ref 135–145)
Total Bilirubin: 0.8 mg/dL (ref <1.2)
Total Protein: 6.5 g/dL (ref 6.5–8.1)

## 2023-04-17 MED ORDER — ACETAMINOPHEN 325 MG PO TABS: 650.0000 mg | ORAL_TABLET | ORAL | Status: DC

## 2023-04-17 MED ORDER — DIPHENHYDRAMINE HCL 25 MG PO CAPS: 25.0000 mg | ORAL_CAPSULE | ORAL | Status: DC

## 2023-04-17 MED ORDER — SODIUM CHLORIDE 0.9 % IV SOLN
2.0000 mg/kg | INTRAVENOUS | Status: AC
  Administered 2023-04-17: 95 mg via INTRAVENOUS
  Filled 2023-04-17: qty 7.6

## 2023-04-17 NOTE — Progress Notes (Signed)
Next infusion scheduled for [name] Aria [phone]) on 06/12/2023 and due for updated orders. Diagnosis: PsA/PsO  Dose: 2mg /kg every 8 weeks  Last Clinic Visit: 02/12/2023 Next Clinic Visit: 07/17/2023  Last infusion: 04/17/2023  Labs: CBC and CMET on 04/17/2023 TB Gold: negative on 08/27/2022   Orders placed for [name] Aria (J1602) x 2 doses along with premedication of acetaminophen and diphenhydramine to be administered 30 minutes before medication infusion.  Standing CBC with diff/platelet and CMP with GFR orders placed to be drawn every 2 months.  Next TB gold due 08/27/2023  Chesley Mires, PharmD, MPH, BCPS, CPP Clinical Pharmacist (Rheumatology and Pulmonology)

## 2023-04-25 ENCOUNTER — Telehealth: Payer: Self-pay | Admitting: Pharmacist

## 2023-04-25 NOTE — Telephone Encounter (Signed)
 Submitted [name] Aria precertification on [facility] provider portal. No authorization is required but submitted for organized determination.  Auth # [id number]

## 2023-05-07 ENCOUNTER — Telehealth: Payer: Self-pay | Admitting: *Deleted

## 2023-05-07 NOTE — Telephone Encounter (Signed)
 Submitted a Prior Authorization request to [employer] for Lidoderm  Patches via CoverMyMeds. Will update once we receive a response.

## 2023-05-07 NOTE — Telephone Encounter (Signed)
 Received notification from [employer] regarding a prior authorization for Lidoderm  Patches. Authorization has been APPROVED from 04/23/2023 to 05/06/2024.

## 2023-06-02 ENCOUNTER — Other Ambulatory Visit: Payer: Self-pay | Admitting: *Deleted

## 2023-06-02 MED ORDER — METHOTREXATE SODIUM 2.5 MG PO TABS: 15.0000 mg | ORAL_TABLET | ORAL | 0 refills | Status: DC

## 2023-06-02 NOTE — Telephone Encounter (Signed)
 Last Fill: 02/17/2023  Labs: 04/17/2023 Glucose is 112.  Creatinine is borderline low. Rest of CMP WNL. CBC WNL.  Next Visit: 07/17/2023  Last Visit: 02/12/2023  DX: Psoriatic arthropathy   Current Dose per office note 02/12/2023:  methotrexate  6 tablets by mouth every 7 days,   Okay to refill Methotrexate ?

## 2023-06-12 ENCOUNTER — Inpatient Hospital Stay (HOSPITAL_COMMUNITY): Admission: RE | Admit: 2023-06-12 | Payer: Medicare (Managed Care) | Source: Ambulatory Visit

## 2023-06-13 ENCOUNTER — Ambulatory Visit (HOSPITAL_COMMUNITY)
Admission: RE | Admit: 2023-06-13 | Discharge: 2023-06-13 | Disposition: A | Discharge status: Home / Self Care | Payer: Medicare Other | Source: Ambulatory Visit | Attending: Rheumatology | Admitting: Rheumatology

## 2023-06-13 DIAGNOSIS — L4050 Arthropathic psoriasis, unspecified: Secondary | ICD-10-CM | POA: Insufficient documentation

## 2023-06-13 DIAGNOSIS — Z79899 Other long term (current) drug therapy: Secondary | ICD-10-CM | POA: Insufficient documentation

## 2023-06-13 DIAGNOSIS — Z111 Encounter for screening for respiratory tuberculosis: Secondary | ICD-10-CM | POA: Diagnosis present

## 2023-06-13 DIAGNOSIS — L409 Psoriasis, unspecified: Secondary | ICD-10-CM | POA: Diagnosis present

## 2023-06-13 LAB — EXTERNAL GENERIC LAB PROCEDURE: COLOGUARD: NEGATIVE

## 2023-06-13 LAB — CBC WITH DIFFERENTIAL/PLATELET
Abs Immature Granulocytes: 0.02 10*3/uL (ref 0.00–0.07)
Basophils Absolute: 0.1 10*3/uL (ref 0.0–0.1)
Basophils Relative: 1 %
Eosinophils Absolute: 0.2 10*3/uL (ref 0.0–0.5)
Eosinophils Relative: 2 %
HCT: 37.3 % (ref 36.0–46.0)
Hemoglobin: 12.7 g/dL (ref 12.0–15.0)
Immature Granulocytes: 0 %
Lymphocytes Relative: 29 %
Lymphs Abs: 2 10*3/uL (ref 0.7–4.0)
MCH: 33.4 pg (ref 26.0–34.0)
MCHC: 34 g/dL (ref 30.0–36.0)
MCV: 98.2 fL (ref 80.0–100.0)
Monocytes Absolute: 0.9 10*3/uL (ref 0.1–1.0)
Monocytes Relative: 12 %
Neutro Abs: 4 10*3/uL (ref 1.7–7.7)
Neutrophils Relative %: 56 %
Platelets: 293 10*3/uL (ref 150–400)
RBC: 3.8 MIL/uL — ABNORMAL LOW (ref 3.87–5.11)
RDW: 12.7 % (ref 11.5–15.5)
WBC: 7.1 10*3/uL (ref 4.0–10.5)
nRBC: 0 % (ref 0.0–0.2)

## 2023-06-13 LAB — COMPREHENSIVE METABOLIC PANEL
ALT: 14 U/L (ref 0–44)
AST: 28 U/L (ref 15–41)
Albumin: 3.5 g/dL (ref 3.5–5.0)
Alkaline Phosphatase: 43 U/L (ref 38–126)
Anion gap: 7 (ref 5–15)
BUN: 17 mg/dL (ref 8–23)
CO2: 22 mmol/L (ref 22–32)
Calcium: 8.9 mg/dL (ref 8.9–10.3)
Chloride: 105 mmol/L (ref 98–111)
Creatinine, Ser: 0.51 mg/dL (ref 0.44–1.00)
GFR, Estimated: >60 mL/min (ref >60)
Glucose, Bld: 92 mg/dL (ref 70–99)
Potassium: 4.3 mmol/L (ref 3.5–5.1)
Sodium: 134 mmol/L — ABNORMAL LOW (ref 135–145)
Total Bilirubin: 0.7 mg/dL (ref 0.0–1.2)
Total Protein: 6.5 g/dL (ref 6.5–8.1)

## 2023-06-13 LAB — COLOGUARD: COLOGUARD: NEGATIVE

## 2023-06-13 MED ORDER — ACETAMINOPHEN 325 MG PO TABS: 650.0000 mg | ORAL_TABLET | ORAL | Status: DC

## 2023-06-13 MED ORDER — SODIUM CHLORIDE 0.9 % IV SOLN
2.0000 mg/kg | INTRAVENOUS | Status: DC
  Administered 2023-06-13: 95 mg via INTRAVENOUS
  Filled 2023-06-13: qty 7.6

## 2023-06-13 MED ORDER — DIPHENHYDRAMINE HCL 25 MG PO CAPS: 25.0000 mg | ORAL_CAPSULE | ORAL | Status: DC

## 2023-06-13 NOTE — Progress Notes (Signed)
 CBC and CMP are stable.

## 2023-07-05 ENCOUNTER — Other Ambulatory Visit: Payer: Self-pay | Admitting: Physician Assistant

## 2023-07-07 NOTE — Telephone Encounter (Signed)
 Does patient need this refilled yet?

## 2023-07-07 NOTE — Telephone Encounter (Signed)
 Last Fill: 03/24/2023  Next Visit: 07/23/2023  Last Visit: 02/12/2023  Dx: Psoriatic arthropathy   Current Dose per office note on 02/12/2023: not mentioned  Okay to refill Lidoderm?

## 2023-07-09 NOTE — Progress Notes (Unsigned)
 Office Visit Note  Patient: Amy Hayes             Date of Birth: [birth year]           MRN: [id number]             PCP: Milda Smart, MD Referring: Milda Smart, MD Visit Date: 07/23/2023 Occupation: @GUAROCC @  Subjective:  Right shoulder pain   History of Present Illness: Amy Hayes is a 78 y.o. female with history of psoriatic arthritis and osteoarthritis.  Patient remains on  [name] Aria IV 2 mg/kg every 8 weeks, methotrexate 6 tablets by mouth every 7 days, and folic acid 1 mg 2 tablets daily.  She is tolerating combination therapy without any side effects.  She has not had any recent gaps in therapy.  She denies any signs or symptoms of a flare.  She continues to have chronic pain in her lower back as well as new onset pain in her right shoulder.  Patient had a fall at the beginning of March 2025 when her dog pulled her.  Patient was evaluated at [facility] and had updated x-rays of the right shoulder.  She underwent a right subacromial bursa cortisone injection at that time.  Patient has had persistent discomfort in the right shoulder and is having tingling from her right shoulder to the right elbow.  She has been unable to lift anything heavy with her right arm.  She has tried using Voltaren gel for pain relief.  She has not followed back up with orthopedics yet.  Activities of Daily Living:  Patient reports morning stiffness for 15-20 minutes.   Patient Reports nocturnal pain.  Difficulty dressing/grooming: Denies Difficulty climbing stairs: Reports Difficulty getting out of chair: Denies Difficulty using hands for taps, buttons, cutlery, and/or writing: Reports  Review of Systems  Constitutional:  Positive for fatigue.  HENT:  Negative for mouth sores and mouth dryness.   Eyes:  Positive for dryness.  Respiratory:  Negative for shortness of breath.   Cardiovascular:  Negative for chest pain and palpitations.  Gastrointestinal:  Negative for blood in stool,  constipation and diarrhea.  Endocrine: Negative for increased urination.  Genitourinary:  Negative for involuntary urination.  Musculoskeletal:  Positive for joint pain, joint pain, joint swelling, myalgias, muscle weakness, morning stiffness, muscle tenderness and myalgias. Negative for gait problem.  Skin:  Positive for sensitivity to sunlight. Negative for color change, rash and hair loss.  Allergic/Immunologic: Negative for susceptible to infections.  Neurological:  Negative for dizziness and headaches.  Hematological:  Negative for swollen glands.  Psychiatric/Behavioral:  Negative for depressed mood and sleep disturbance. The patient is not nervous/anxious.     PMFS History:  Patient Active Problem List   Diagnosis Date Noted   Essential hypertension 12/23/2019   Pain in both lower extremities 12/23/2019   Psoriatic arthropathy (HCC) 12/22/2018   DDD (degenerative disc disease), lumbar 07/24/2017   Primary osteoarthritis of both hands 03/04/2017   DDD (degenerative disc disease), cervical s/p Fusion  10/30/2016   Raynaud's disease without gangrene 10/30/2016   History of migraine 10/30/2016   History of gastroesophageal reflux (GERD) 10/30/2016   High risk medication use 10/30/2016   Psoriasis 05/02/2016   Bilateral low back pain with sciatica 05/02/2016   Osteopenia 05/02/2016    Past Medical History:  Diagnosis Date   Arthritis    GERD (gastroesophageal reflux disease)    Hyperlipidemia    Hypertension    Migraine  Osteoporosis     Family History  Problem Relation Age of Onset   Leukemia Mother    Stroke Father    Hypertension Sister    Hypertension Brother    Stroke Brother    Past Surgical History:  Procedure Laterality Date   ABDOMINAL HYSTERECTOMY     CARPAL TUNNEL RELEASE     CERVICAL FUSION     EYE SURGERY     ingrown toenail removal     KNEE ARTHROSCOPY Right    KNEE CARTILAGE SURGERY     ROTATOR CUFF REPAIR     SPINAL FUSION     cervical  fusion   Social History   Social History Narrative   Not on file   Immunization History  Administered Date(s) Administered   Influenza Split 02/15/2010   Influenza, High Dose Seasonal PF 02/06/2015, 01/23/2016, 01/20/2017, 02/20/2018   Influenza, Seasonal, Injecte, Preservative Fre 02/11/2011, 01/04/2013, 04/05/2014   Influenza,trivalent, recombinat, inj, PF 01/31/2012   Influenza-Unspecified 02/10/2023   PFIZER(Purple Top)SARS-COV-2 Vaccination 07/05/2019, 07/26/2019, 12/17/2019, 07/04/2020, 03/07/2021   Pneumococcal Conjugate-13 08/11/2014   Pneumococcal Polysaccharide-23 05/29/2009, 08/29/2015   Td 07/06/2018   Tdap 12/17/2007   Unspecified SARS-COV-2 Vaccination 02/10/2023   Zoster Recombinant(Shingrix) 02/27/2018, 06/12/2018     Objective: Vital Signs: BP 124/71 (BP Location: Left Arm, Patient Position: Sitting, Cuff Size: Normal)   Pulse (!) 54   Resp 14   Ht 5' (1.524 m)   Wt 103 lb (46.7 kg)   BMI 20.12 kg/m    Physical Exam Vitals and nursing note reviewed.  Constitutional:      Appearance: She is well-developed.  HENT:     Head: Normocephalic and atraumatic.  Eyes:     Conjunctiva/sclera: Conjunctivae normal.  Cardiovascular:     Rate and Rhythm: Normal rate and regular rhythm.     Heart sounds: Normal heart sounds.  Pulmonary:     Effort: Pulmonary effort is normal.     Breath sounds: Normal breath sounds.  Abdominal:     General: Bowel sounds are normal.     Palpations: Abdomen is soft.  Musculoskeletal:     Cervical back: Normal range of motion.  Lymphadenopathy:     Cervical: No cervical adenopathy.  Skin:    General: Skin is warm and dry.     Capillary Refill: Capillary refill takes less than 2 seconds.  Neurological:     Mental Status: She is alert and oriented to person, place, and time.  Psychiatric:        Behavior: Behavior normal.      Musculoskeletal Exam: C-spine has limited range of motion with lateral rotation.  Some discomfort  with flexion as well as lateral rotation to the right.  No discomfort with internal rotation or crossover of the right shoulder.  Painful range of motion of the right shoulder with abduction and forward flexion.  Tenderness over the acromioclavicular joint of the right shoulder.  Left shoulder has full range of motion.  Elbow joints and wrist joints have good range of motion.  Severe PIP and DIP thickening.  Subluxation of several DIP joints.  Hip joints have good range of motion with no groin pain.  Knee joints have good range of motion with no warmth or effusion.  Ankle joints have good range of motion with no tenderness or joint swelling.  CDAI Exam: CDAI Score: -- Patient Global: --; Provider Global: -- Swollen: --; Tender: -- Joint Exam 07/23/2023   No joint exam has been documented for this visit  There is currently no information documented on the homunculus. Go to the Rheumatology activity and complete the homunculus joint exam.  Investigation: No additional findings.  Imaging: No results found.  Recent Labs: Lab Results  Component Value Date   WBC 7.1 06/13/2023   HGB 12.7 06/13/2023   PLT 293 06/13/2023   NA 134 (L) 06/13/2023   K 4.3 06/13/2023   CL 105 06/13/2023   CO2 22 06/13/2023   GLUCOSE 92 06/13/2023   BUN 17 06/13/2023   CREATININE 0.51 06/13/2023   BILITOT 0.7 06/13/2023   ALKPHOS 43 06/13/2023   AST 28 06/13/2023   ALT 14 06/13/2023   PROT 6.5 06/13/2023   ALBUMIN 3.5 06/13/2023   CALCIUM 8.9 06/13/2023   GFRAA >60 12/09/2019   QFTBGOLDPLUS Negative 08/27/2022    Speciality Comments: [name] Aria infusions every 8 weeks, started on 11/2016 TB gold negative 08/01/16  DEXA Scan in Care Everywhere 12/18/2022  Procedures:  No procedures performed Allergies: Codeine     Assessment / Plan:     Visit Diagnoses: Psoriatic arthropathy (HCC): No synovitis or dactylitis noted on examination today.  No active psoriasis at this time.  No evidence of Achilles  tendinitis or plantar fasciitis.  No SI joint pain currently.  Patient remains on methotrexate 6 tablets by mouth once weekly, folic acid 2 mg daily, and [name] Aria IV infusions 2 mg/kg every 8 weeks.  She has been tolerating combination therapy without any side effects and has not had any recent gaps in therapy.  No recent or recurrent infections.  Overall her symptoms remain stable so no medication changes will be made at this time.  She was advised to notify us if she develops signs or symptoms of a flare.  She will follow-up in the office in 5 months or sooner if needed.  Psoriasis: No active psoriasis at this time.   High risk medication use - [name] Aria IV 2 mg/kg every 8 weeks, methotrexate 6 tablets by mouth every 7 days, and folic acid 1 mg 2 tablets daily. CBC and CMP updated on 06/13/23.  She will continue to have lab work with infusions. TB gold negative 08/27/22. Future order for TB gold placed today.  Discussed the importance of holding simponi aria and methotrexate if she develops signs or symptoms of an infection and to resume once the infection has completely cleared.   - Plan: QuantiFERON-TB Gold Plus  Screening for tuberculosis -Future order for TB gold placed today.  Plan: QuantiFERON-TB Gold Plus  Chronic right shoulder pain: Patient had a fall in early March 2025 while walking her dog on a leash.  Her dog pulled and she ended up falling on concrete.  She injured her right shoulder at that time.  Patient was evaluated at [facility] urgent care on 06/23/2023.  Patient had x-rays of the right shoulder which revealed elevation of the humeral head.  No evidence of obvious fractures or dislocations.  Minimal glenohumeral osteoarthritis noted.  A right subacromial bursa injection was performed at that visit.  Patient experienced temporary relief after the injection but since then she has been having progressively worsening pain in her right shoulder.  She is unable to lift anything with  her right arm due to the discomfort.  She is also been experiencing a tingling sensation from her right shoulder to her right elbow. On examination she has good range of motion with some discomfort with abduction and forward flexion.  No discomfort with internal rotation or crossover.  She has tenderness over the right AC joint and subacromial bursa. Orthopedics had recommended physical therapy if her symptoms persist did or worsened but she declined PT.  Recommended following back up with orthopedics for further evaluation.  She may require an MRI or further evaluation of the cervical spine since she is experiencing tingling in her right upper extremity.  The patient and her husband requested a prednisone taper to see if that will help alleviate the nerve pain she is experiencing.  A low-dose short prednisone taper starting at 20 mg open by 5 mg every 2 days were sent to the pharmacy.  She will be following up with orthopedics for further evaluation.  Primary osteoarthritis of both hands: She has severe PIP and DIP thickening.  Subluxation of several DIP joints.  DDD (degenerative disc disease), cervical: C-spine has limited range of motion especially with lateral rotation.  Discomfort with flexion of the cervical spine.  Degeneration of intervertebral disc of lumbar region without discogenic back pain or lower extremity pain: Chronic pain.  Raynaud's disease without gangrene: No signs of sclerodactyly.  Osteopenia of multiple sites: 2023 DEXA T-score -1.6, BMD 0.817 RFN,-8.7% compared 2021. T-score 1.3, BMD 1.333 -12.1% compared to 2021. Patient was started on Fosamax in mid February 2024 and took it for 2 months but discontinued due to experiencing jaw and dental pain.  She does not plan to resume Fosamax.  Patient had a repeat DEXA on 12/18/2022: Left femoral neck BMD 0.777 with T-score -1.8.  Right femoral neck BMD 0.807 with T-score -1.7.  Total mean change from previous: No significant interval  change. Patient plans on continuing calcium and vitamin D supplementation  Other medical conditions are listed as follows:   History of gastroesophageal reflux (GERD)  History of migraine  Essential hypertension: Blood pressure was 124/71 today in the office.   Orders: Orders Placed This Encounter  Procedures   QuantiFERON-TB Gold Plus   Meds ordered this encounter  Medications   predniSONE (DELTASONE) 5 MG tablet    Sig: Take 4 tabs po x 2 days, 3  tabs po x 2 days, 2  tabs po x 2 days, 1  tab po x 2 days    Dispense:  20 tablet    Refill:  0   lidocaine (LIDODERM) 5 %    Sig: place TWO patches onto THE SKIN daily. REMOVE AND discard PATCH wthin 12 hours or as directed by md.    Dispense:  60 patch    Refill:  2    Follow-Up Instructions: Return in about 5 months (around 12/23/2023).   Gearldine Bienenstock, PA-C  Note - This record has been created using Dragon software.  Chart creation errors have been sought, but may not always  have been located. Such creation errors do not reflect on  the standard of medical care.

## 2023-07-17 ENCOUNTER — Ambulatory Visit: Payer: Medicare (Managed Care) | Admitting: Rheumatology

## 2023-07-23 ENCOUNTER — Ambulatory Visit: Payer: Self-pay | Attending: Physician Assistant | Admitting: Physician Assistant

## 2023-07-23 ENCOUNTER — Encounter: Payer: Self-pay | Admitting: Physician Assistant

## 2023-07-23 VITALS — BP 124/71 | HR 54 | Resp 14 | Ht 60.0 in | Wt 103.0 lb

## 2023-07-23 DIAGNOSIS — Z8669 Personal history of other diseases of the nervous system and sense organs: Secondary | ICD-10-CM

## 2023-07-23 DIAGNOSIS — M8589 Other specified disorders of bone density and structure, multiple sites: Secondary | ICD-10-CM

## 2023-07-23 DIAGNOSIS — I7300 Raynaud's syndrome without gangrene: Secondary | ICD-10-CM

## 2023-07-23 DIAGNOSIS — M51369 Other intervertebral disc degeneration, lumbar region without mention of lumbar back pain or lower extremity pain: Secondary | ICD-10-CM

## 2023-07-23 DIAGNOSIS — Z79899 Other long term (current) drug therapy: Secondary | ICD-10-CM

## 2023-07-23 DIAGNOSIS — M5030 Other cervical disc degeneration, unspecified cervical region: Secondary | ICD-10-CM

## 2023-07-23 DIAGNOSIS — M19042 Primary osteoarthritis, left hand: Secondary | ICD-10-CM

## 2023-07-23 DIAGNOSIS — M25511 Pain in right shoulder: Secondary | ICD-10-CM

## 2023-07-23 DIAGNOSIS — L4050 Arthropathic psoriasis, unspecified: Secondary | ICD-10-CM | POA: Diagnosis not present

## 2023-07-23 DIAGNOSIS — M25512 Pain in left shoulder: Secondary | ICD-10-CM

## 2023-07-23 DIAGNOSIS — L409 Psoriasis, unspecified: Secondary | ICD-10-CM | POA: Diagnosis not present

## 2023-07-23 DIAGNOSIS — G8929 Other chronic pain: Secondary | ICD-10-CM

## 2023-07-23 DIAGNOSIS — Z8719 Personal history of other diseases of the digestive system: Secondary | ICD-10-CM

## 2023-07-23 DIAGNOSIS — I10 Essential (primary) hypertension: Secondary | ICD-10-CM

## 2023-07-23 DIAGNOSIS — M19041 Primary osteoarthritis, right hand: Secondary | ICD-10-CM | POA: Diagnosis not present

## 2023-07-23 DIAGNOSIS — Z111 Encounter for screening for respiratory tuberculosis: Secondary | ICD-10-CM

## 2023-07-23 MED ORDER — LIDOCAINE 5 % EX PTCH: MEDICATED_PATCH | CUTANEOUS | 2 refills | Status: DC

## 2023-07-23 MED ORDER — PREDNISONE 5 MG PO TABS: ORAL_TABLET | ORAL | 0 refills | Status: DC

## 2023-08-07 ENCOUNTER — Other Ambulatory Visit (HOSPITAL_COMMUNITY): Payer: Self-pay | Admitting: *Deleted

## 2023-08-07 DIAGNOSIS — L4050 Arthropathic psoriasis, unspecified: Secondary | ICD-10-CM

## 2023-08-08 ENCOUNTER — Ambulatory Visit (HOSPITAL_COMMUNITY)
Admission: RE | Admit: 2023-08-08 | Discharge: 2023-08-08 | Disposition: A | Discharge status: Home / Self Care | Payer: Medicare Other | Source: Ambulatory Visit | Attending: Rheumatology | Admitting: Rheumatology

## 2023-08-08 DIAGNOSIS — L4050 Arthropathic psoriasis, unspecified: Secondary | ICD-10-CM | POA: Diagnosis present

## 2023-08-08 DIAGNOSIS — Z111 Encounter for screening for respiratory tuberculosis: Secondary | ICD-10-CM | POA: Diagnosis present

## 2023-08-08 DIAGNOSIS — Z79899 Other long term (current) drug therapy: Secondary | ICD-10-CM | POA: Diagnosis present

## 2023-08-08 DIAGNOSIS — L409 Psoriasis, unspecified: Secondary | ICD-10-CM | POA: Insufficient documentation

## 2023-08-08 LAB — CBC WITH DIFFERENTIAL/PLATELET
Abs Immature Granulocytes: 0.03 10*3/uL (ref 0.00–0.07)
Basophils Absolute: 0 10*3/uL (ref 0.0–0.1)
Basophils Relative: 1 %
Eosinophils Absolute: 0.1 10*3/uL (ref 0.0–0.5)
Eosinophils Relative: 2 %
HCT: 40.7 % (ref 36.0–46.0)
Hemoglobin: 13.2 g/dL (ref 12.0–15.0)
Immature Granulocytes: 0 %
Lymphocytes Relative: 26 %
Lymphs Abs: 2 10*3/uL (ref 0.7–4.0)
MCH: 32.1 pg (ref 26.0–34.0)
MCHC: 32.4 g/dL (ref 30.0–36.0)
MCV: 99 fL (ref 80.0–100.0)
Monocytes Absolute: 1.2 10*3/uL — ABNORMAL HIGH (ref 0.1–1.0)
Monocytes Relative: 16 %
Neutro Abs: 4.5 10*3/uL (ref 1.7–7.7)
Neutrophils Relative %: 55 %
Platelets: 279 10*3/uL (ref 150–400)
RBC: 4.11 MIL/uL (ref 3.87–5.11)
RDW: 13.1 % (ref 11.5–15.5)
WBC: 7.9 10*3/uL (ref 4.0–10.5)
nRBC: 0 % (ref 0.0–0.2)

## 2023-08-08 LAB — COMPREHENSIVE METABOLIC PANEL WITH GFR
ALT: 14 U/L (ref 0–44)
AST: 23 U/L (ref 15–41)
Albumin: 3.5 g/dL (ref 3.5–5.0)
Alkaline Phosphatase: 53 U/L (ref 38–126)
Anion gap: 11 (ref 5–15)
BUN: 16 mg/dL (ref 8–23)
CO2: 24 mmol/L (ref 22–32)
Calcium: 9 mg/dL (ref 8.9–10.3)
Chloride: 100 mmol/L (ref 98–111)
Creatinine, Ser: 0.58 mg/dL (ref 0.44–1.00)
GFR, Estimated: >60 mL/min (ref >60)
Glucose, Bld: 94 mg/dL (ref 70–99)
Potassium: 4.2 mmol/L (ref 3.5–5.1)
Sodium: 135 mmol/L (ref 135–145)
Total Bilirubin: 0.7 mg/dL (ref 0.0–1.2)
Total Protein: 6.8 g/dL (ref 6.5–8.1)

## 2023-08-08 MED ORDER — ACETAMINOPHEN 325 MG PO TABS: 650.0000 mg | ORAL_TABLET | ORAL | Status: DC

## 2023-08-08 MED ORDER — DIPHENHYDRAMINE HCL 25 MG PO CAPS: 25.0000 mg | ORAL_CAPSULE | ORAL | Status: DC

## 2023-08-08 MED ORDER — SODIUM CHLORIDE 0.9 % IV SOLN
2.0000 mg/kg | INTRAVENOUS | Status: DC
  Administered 2023-08-08: 93.75 mg via INTRAVENOUS
  Filled 2023-08-08: qty 7.5

## 2023-08-11 NOTE — Progress Notes (Signed)
 CBC and CMP wnl. TB gold still pending

## 2023-08-12 LAB — QUANTIFERON-TB GOLD PLUS (RQFGPL)
QuantiFERON Mitogen Value: >10 [IU]/mL
QuantiFERON Nil Value: 0.1 [IU]/mL
QuantiFERON TB1 Ag Value: 0.08 [IU]/mL
QuantiFERON TB2 Ag Value: 0.09 [IU]/mL

## 2023-08-12 LAB — QUANTIFERON-TB GOLD PLUS: QuantiFERON-TB Gold Plus: NEGATIVE

## 2023-08-13 NOTE — Progress Notes (Signed)
 TB gold negative

## 2023-09-12 MED ORDER — METHOTREXATE SODIUM 2.5 MG PO TABS: 15.0000 mg | ORAL_TABLET | ORAL | 0 refills | Status: DC

## 2023-09-12 NOTE — Telephone Encounter (Signed)
 Last Fill: 06/02/2023  Labs: 08/08/2023 CBC and CMP wnl.   Next Visit: 01/07/2024  Last Visit: 07/23/2023  DX: Psoriatic arthropathy   Current Dose per office note 07/23/2023: methotrexate  6 tablets by mouth every 7 days   Okay to refill Methotrexate ?

## 2023-09-16 ENCOUNTER — Encounter: Payer: Self-pay | Admitting: Rheumatology

## 2023-09-16 NOTE — Telephone Encounter (Signed)
 She should discontinue [name]  Aria infusion 8 weeks prior to the surgery and may resume 2 weeks after if there is no infection.  Methotrexate  should not be stopped 1 week prior to the surgery and resume 2 weeks after the surgery if there is no infection.

## 2023-09-30 ENCOUNTER — Other Ambulatory Visit: Payer: Self-pay | Admitting: Rheumatology

## 2023-09-30 NOTE — Telephone Encounter (Signed)
 Last Fill: 10/04/2022  Next Visit: 01/07/2024  Last Visit: 07/23/2023  Dx: Psoriatic arthropathy   Current Dose per office note on 07/23/2023: folic acid  1 mg 2 tablets daily.   Okay to refill Folic Acid ?

## 2023-10-03 ENCOUNTER — Encounter (HOSPITAL_COMMUNITY)

## 2023-10-17 ENCOUNTER — Other Ambulatory Visit: Payer: Self-pay | Admitting: Physician Assistant

## 2023-10-17 NOTE — Telephone Encounter (Signed)
 Last Fill: 07/23/2023  Next Visit: 01/07/2024  Last Visit: 07/23/2023  Dx: Degeneration of intervertebral disc of lumbar region   Current Dose per office note on 07/23/2023: dose not discussed  Okay to refill Lidoderm ?

## 2023-10-28 ENCOUNTER — Telehealth (HOSPITAL_COMMUNITY): Payer: Self-pay | Admitting: Pharmacy Technician

## 2023-10-28 NOTE — Telephone Encounter (Signed)
 Auth Submission: NO AUTH NEEDED Site of care: Site of care: [facility] Payer: [employer] MEDICARE Medication & CPT/J Code(s) submitted: [name]  aria (Golimumab ) [id number] Diagnosis Code: L40.50 Route of submission (phone, fax, portal): portal Phone # Fax # Auth type: Buy/Bill HB Units/visits requested: 2mg /kg q 8 weeks Reference number:  Approval from: 10/28/23 to 04/21/24      Dagoberto Armour, CPhT [facility] [phone]

## 2023-10-31 HISTORY — PX: OTHER SURGICAL HISTORY: SHX169

## 2023-11-06 NOTE — Telephone Encounter (Signed)
 Patient will require clearance by the surgeon prior to resuming methotrexate  and [name]  Aria.

## 2023-11-28 ENCOUNTER — Telehealth: Payer: Self-pay | Admitting: Pharmacist

## 2023-11-28 NOTE — Telephone Encounter (Signed)
 Received message from Kristin, Charity fundraiser at [facility] [employer]. Patient has infusion scheduled for Monday, 12/01/2023 however patient was supposed to receive clearance to resume infusions and methotrexate   MyChart message sent to patient  Infusion scheduled for Monday has been cancelled  Her last infusion was 08/08/2023. Patient will need to reload treatment (has been off for > 3 months)  Sherry Pennant, PharmD, MPH, BCPS, CPP Clinical Pharmacist (Rheumatology and Pulmonology)

## 2023-11-28 NOTE — Telephone Encounter (Signed)
 Patient advised we need written clearance from your surgeon before you can resume any immunosuppressive medication. Patient advised Amy Hayes is cancelling the infusion for Monday. Patient states she will call her surgeon and have the clearance sent to our office.

## 2023-12-01 ENCOUNTER — Inpatient Hospital Stay (HOSPITAL_COMMUNITY): Admission: RE | Admit: 2023-12-01 | Source: Ambulatory Visit

## 2023-12-01 ENCOUNTER — Encounter: Payer: Self-pay | Admitting: Rheumatology

## 2023-12-02 ENCOUNTER — Other Ambulatory Visit: Payer: Self-pay | Admitting: Pharmacist

## 2023-12-02 DIAGNOSIS — L4050 Arthropathic psoriasis, unspecified: Secondary | ICD-10-CM

## 2023-12-02 DIAGNOSIS — Z79899 Other long term (current) drug therapy: Secondary | ICD-10-CM

## 2023-12-02 DIAGNOSIS — L409 Psoriasis, unspecified: Secondary | ICD-10-CM

## 2023-12-02 NOTE — Progress Notes (Signed)
 Next infusion for [name]  Aria [phone]) not yet scheduled and due for updated orders. Received clearance from [facility] on 8/11/205 Diagnosis: PsA/PsO  Dose: 2mg /kg at Week 0, Week 4 then every 8 weeks (RELOADING due to interruption in treatment > 3 months)  Last Clinic Visit: 07/23/2023 Next Clinic Visit: 01/07/2024  Last infusion: 08/09/2023  Labs: CBC/CMP TB Gold: negative 08/08/2023   Orders placed for [name]  Aria ([id number]) x 2 doses along with premedication of acetaminophen  650mg  p.o. and diphenhydramine  25mg  p.o. to be administered 30 minutes before medication infusion.  Standing CBC with diff/platelet and CMP with GFR orders placed to be drawn every 2 months.  Next TB gold due April 2026  Sherry Pennant, PharmD, MPH, BCPS, CPP Clinical Pharmacist (Rheumatology and Pulmonology)

## 2023-12-05 ENCOUNTER — Ambulatory Visit (HOSPITAL_COMMUNITY)
Admission: RE | Admit: 2023-12-05 | Discharge: 2023-12-05 | Disposition: A | Discharge status: Home / Self Care | Source: Ambulatory Visit | Attending: Rheumatology | Admitting: Rheumatology

## 2023-12-05 ENCOUNTER — Ambulatory Visit: Payer: Self-pay | Admitting: Physician Assistant

## 2023-12-05 DIAGNOSIS — L4050 Arthropathic psoriasis, unspecified: Secondary | ICD-10-CM | POA: Insufficient documentation

## 2023-12-05 DIAGNOSIS — Z79899 Other long term (current) drug therapy: Secondary | ICD-10-CM

## 2023-12-05 DIAGNOSIS — L409 Psoriasis, unspecified: Secondary | ICD-10-CM

## 2023-12-05 LAB — COMPREHENSIVE METABOLIC PANEL WITH GFR
ALT: 13 U/L (ref 0–44)
AST: 22 U/L (ref 15–41)
Albumin: 3.7 g/dL (ref 3.5–5.0)
Alkaline Phosphatase: 67 U/L (ref 38–126)
Anion gap: 9 (ref 5–15)
BUN: 17 mg/dL (ref 8–23)
CO2: 25 mmol/L (ref 22–32)
Calcium: 9.1 mg/dL (ref 8.9–10.3)
Chloride: 103 mmol/L (ref 98–111)
Creatinine, Ser: 0.57 mg/dL (ref 0.44–1.00)
GFR, Estimated: >60 mL/min (ref >60)
Glucose, Bld: 94 mg/dL (ref 70–99)
Potassium: 4.3 mmol/L (ref 3.5–5.1)
Sodium: 137 mmol/L (ref 135–145)
Total Bilirubin: 0.9 mg/dL (ref 0.0–1.2)
Total Protein: 7 g/dL (ref 6.5–8.1)

## 2023-12-05 LAB — CBC WITH DIFFERENTIAL/PLATELET
Abs Immature Granulocytes: 0.02 K/uL (ref 0.00–0.07)
Basophils Absolute: 0 K/uL (ref 0.0–0.1)
Basophils Relative: 1 %
Eosinophils Absolute: 0.1 K/uL (ref 0.0–0.5)
Eosinophils Relative: 2 %
HCT: 40.8 % (ref 36.0–46.0)
Hemoglobin: 13.1 g/dL (ref 12.0–15.0)
Immature Granulocytes: 0 %
Lymphocytes Relative: 30 %
Lymphs Abs: 2.1 K/uL (ref 0.7–4.0)
MCH: 30.5 pg (ref 26.0–34.0)
MCHC: 32.1 g/dL (ref 30.0–36.0)
MCV: 94.9 fL (ref 80.0–100.0)
Monocytes Absolute: 0.7 K/uL (ref 0.1–1.0)
Monocytes Relative: 10 %
Neutro Abs: 4.1 K/uL (ref 1.7–7.7)
Neutrophils Relative %: 57 %
Platelets: 323 K/uL (ref 150–400)
RBC: 4.3 MIL/uL (ref 3.87–5.11)
RDW: 13.4 % (ref 11.5–15.5)
WBC: 7.1 K/uL (ref 4.0–10.5)
nRBC: 0 % (ref 0.0–0.2)

## 2023-12-05 MED ORDER — SODIUM CHLORIDE 0.9 % IV SOLN
2.0000 mg/kg | INTRAVENOUS | Status: DC
  Administered 2023-12-05: 91.25 mg via INTRAVENOUS
  Filled 2023-12-05: qty 7.3

## 2023-12-05 MED ORDER — ACETAMINOPHEN 325 MG PO TABS: 650.0000 mg | ORAL_TABLET | Freq: Once | ORAL | Status: DC

## 2023-12-05 MED ORDER — DIPHENHYDRAMINE HCL 25 MG PO CAPS: 25.0000 mg | ORAL_CAPSULE | Freq: Once | ORAL | Status: DC

## 2023-12-05 NOTE — Progress Notes (Signed)
 CMP WNL

## 2023-12-05 NOTE — Progress Notes (Signed)
 CBC WNL

## 2023-12-26 NOTE — Progress Notes (Signed)
 Office Visit Note  Patient: Amy Hayes             Date of Birth: Feb 08, 1946           MRN: [id number]             PCP: Ezra Evalene PARAS, MD Referring: Ezra Evalene PARAS, MD Visit Date: 01/07/2024 Occupation: @GUAROCC @  Subjective:  Medication management  History of Present Illness: Amy Hayes is a 79 y.o. female with psoriatic arthritis, psoriasis, osteoarthritis.  She returns today after her last visit in April 2025.  She is on [name]  Aria IV 2 mg/kg every 8 weeks with methotrexate  6 tablets p.o. weekly and folic acid  1 mg 2 tablets p.o. daily without any interruption.  She had a good response to left subacromial injection given on Aug 28, 2022.  She continues to have pain and discomfort in her right shoulder since March 2025.  She had x-rays of the right shoulder on June 23, 2023.  I reviewed the records according to wedge right shoulder joint showed elevation of the humeral head no fracture and minimal glenohumeral osteoarthritis was noted.  She was evaluated at [facility] where she had right subacromial bursa cortisone injection.  She had spinal cord stimulator placement on October 31, 2023.  She had a good response to it.  She feels the strength in her lower back is better.  She has not had any psoriasis breakout.  She denies any increased swelling in her joints    Activities of Daily Living:  Patient reports morning stiffness for 30 minutes.   Patient Denies nocturnal pain.  Difficulty dressing/grooming: Denies Difficulty climbing stairs: Reports Difficulty getting out of chair: Denies Difficulty using hands for taps, buttons, cutlery, and/or writing: Reports  Review of Systems  Constitutional:  Positive for fatigue.  HENT:  Negative for mouth sores and mouth dryness.   Eyes:  Positive for dryness.  Respiratory:  Negative for shortness of breath.   Cardiovascular:  Negative for chest pain and palpitations.  Gastrointestinal:  Negative for blood in stool, constipation and  diarrhea.  Endocrine: Negative for increased urination.  Genitourinary:  Negative for involuntary urination.  Musculoskeletal:  Positive for joint pain, joint pain, muscle weakness and morning stiffness. Negative for gait problem, joint swelling, myalgias, muscle tenderness and myalgias.  Skin:  Negative for color change, rash, hair loss and sensitivity to sunlight.  Allergic/Immunologic: Negative for susceptible to infections.  Neurological:  Negative for dizziness and headaches.  Hematological:  Negative for swollen glands.  Psychiatric/Behavioral:  Negative for depressed mood and sleep disturbance. The patient is not nervous/anxious.     PMFS History:  Patient Active Problem List   Diagnosis Date Noted   Essential hypertension 12/23/2019   Pain in both lower extremities 12/23/2019   Psoriatic arthropathy (HCC) 12/22/2018   DDD (degenerative disc disease), lumbar 07/24/2017   Primary osteoarthritis of both hands 03/04/2017   DDD (degenerative disc disease), cervical s/p Fusion  10/30/2016   Raynaud's disease without gangrene 10/30/2016   History of migraine 10/30/2016   History of gastroesophageal reflux (GERD) 10/30/2016   High risk medication use 10/30/2016   Psoriasis 05/02/2016   Bilateral low back pain with sciatica 05/02/2016   Osteopenia 05/02/2016    Past Medical History:  Diagnosis Date   Arthritis    GERD (gastroesophageal reflux disease)    Hyperlipidemia    Hypertension    Migraine    Osteoporosis     Family History  Problem Relation Age  of Onset   Leukemia Mother    Stroke Father    Hypertension Sister    Hypertension Brother    Stroke Brother    Past Surgical History:  Procedure Laterality Date   ABDOMINAL HYSTERECTOMY     CARPAL TUNNEL RELEASE     CERVICAL FUSION     EYE SURGERY     ingrown toenail removal     KNEE ARTHROSCOPY Right    KNEE CARTILAGE SURGERY     reactive implant surgery  10/31/2023   ROTATOR CUFF REPAIR     SPINAL FUSION      cervical fusion   Social History   Social History Narrative   Not on file   Immunization History  Administered Date(s) Administered   INFLUENZA, HIGH DOSE SEASONAL PF 02/06/2015, 01/23/2016, 01/20/2017, 02/20/2018   Influenza Split 02/15/2010   Influenza, Seasonal, Injecte, Preservative Fre 02/11/2011, 01/04/2013, 04/05/2014   Influenza,trivalent, recombinat, inj, PF 01/31/2012   Influenza-Unspecified 02/10/2023   PFIZER(Purple Top)SARS-COV-2 Vaccination 07/05/2019, 07/26/2019, 12/17/2019, 07/04/2020, 03/07/2021   Pneumococcal Conjugate-13 08/11/2014   Pneumococcal Polysaccharide-23 05/29/2009, 08/29/2015   Td 07/06/2018   Tdap 12/17/2007   Unspecified SARS-COV-2 Vaccination 02/10/2023   Zoster Recombinant(Shingrix) 02/27/2018, 06/12/2018     Objective: Vital Signs: BP 133/72   Pulse (!) 54   Temp 98.1 F (36.7 C)   Resp 13   Ht 5' (1.524 m)   Wt 103 lb 9.6 oz (47 kg)   BMI 20.23 kg/m    Physical Exam Vitals and nursing note reviewed.  Constitutional:      Appearance: She is well-developed.  HENT:     Head: Normocephalic and atraumatic.  Eyes:     Conjunctiva/sclera: Conjunctivae normal.  Cardiovascular:     Rate and Rhythm: Normal rate and regular rhythm.     Heart sounds: Normal heart sounds.  Pulmonary:     Effort: Pulmonary effort is normal.     Breath sounds: Normal breath sounds.  Abdominal:     General: Bowel sounds are normal.     Palpations: Abdomen is soft.  Musculoskeletal:     Cervical back: Normal range of motion.  Lymphadenopathy:     Cervical: No cervical adenopathy.  Skin:    General: Skin is warm and dry.     Capillary Refill: Capillary refill takes less than 2 seconds.  Neurological:     Mental Status: She is alert and oriented to person, place, and time.  Psychiatric:        Behavior: Behavior normal.      Musculoskeletal Exam: She had limited lateral rotation of the cervical spine.  She had limited range of motion of the lumbar  spine without much discomfort.  Right shoulder joint abduction and forward flexion was painful with tenderness over the right subacromial region.  Left shoulder joint was in full range of motion.  Elbow joints and wrist joints with good range of motion.  She had bilateral CMC, PIP and DIP thickening with PIP and DIP subluxation.  No synovitis was noted.  Hip joints and knee joints in good range of motion.  There was no tenderness over her ankles or MTPs.  No plantar fasciitis or Achilles tendinitis was noted.  CDAI Exam: CDAI Score: -- Patient Global: --; Provider Global: -- Swollen: --; Tender: -- Joint Exam 01/07/2024   No joint exam has been documented for this visit   There is currently no information documented on the homunculus. Go to the Rheumatology activity and complete the homunculus joint exam.  Investigation: No additional findings.  Imaging: No results found.  Recent Labs: Lab Results  Component Value Date   WBC 7.1 12/05/2023   HGB 13.1 12/05/2023   PLT 323 12/05/2023   NA 137 12/05/2023   K 4.3 12/05/2023   CL 103 12/05/2023   CO2 25 12/05/2023   GLUCOSE 94 12/05/2023   BUN 17 12/05/2023   CREATININE 0.57 12/05/2023   BILITOT 0.9 12/05/2023   ALKPHOS 67 12/05/2023   AST 22 12/05/2023   ALT 13 12/05/2023   PROT 7.0 12/05/2023   ALBUMIN 3.7 12/05/2023   CALCIUM 9.1 12/05/2023   GFRAA >60 12/09/2019   QFTBGOLDPLUS Negative 08/08/2023    Speciality Comments: [name]  Aria infusions every 8 weeks, started on 11/2016 TB gold negative 08/01/16  DEXA Scan in Care Everywhere 12/18/2022  Procedures:  Large Joint Inj: R subacromial bursa on 01/07/2024 2:11 PM Indications: pain Details: 27 G 1.5 in needle, posterior approach  Arthrogram: No  Medications: 1 mL lidocaine  1 %; 40 mg triamcinolone  acetonide 40 MG/ML Aspirate: 0 mL Outcome: tolerated well, no immediate complications  Risk of infection, tendon injury, nerve injury, hypopigmentation and dermal atrophy  was discussed. Procedure, treatment alternatives, risks and benefits explained, specific risks discussed. Consent was given by the patient. Immediately prior to procedure a time out was called to verify the correct patient, procedure, equipment, support staff and site/side marked as required. Patient was prepped and draped in the usual sterile fashion.     Allergies: Codeine   Assessment / Plan:     Visit Diagnoses: Psoriatic arthropathy (HCC)-patient denies having a psoriatic arthritis flare.  She has been on [name]  Aria and methotrexate  combination without any interruption.  She denies any history of plantar fasciitis or Achilles tendinitis.  No synovitis was noted on the examination today.  She continues to have pain and discomfort in her right shoulder.  Psoriasis-no active psoriasis lesions were noted.  High risk medication use - [name]  Aria IV 2 mg/kg every 8 weeks, methotrexate  6 tablets by mouth every 7 days, and folic acid  1 mg 2 tablets daily.  December 05, 2023 CBC and CMP were normal.  TB Gold was negative on August 08, 2023.  She was advised to get labs every 3 months to monitor for drug toxicity.  Information reimmunization was placed in the AVS.  She was advised to hold [name]  and methotrexate  if she develops an infection resume after the infection resolves.  Annual skin examination to screen for skin cancer was advised.  Use of sunscreen and sun protection was discussed.  Chronic right shoulder pain-she has been having discomfort since March 2025.  She had subacromial injection in March by orthopedics without much help.  Patient requested a repeat injection today.  I reviewed her previous x-rays which were unremarkable except for mild glenohumeral joint space narrowing.  After informed consent was obtained and side effects were discussed right subacromial region was injected with lidocaine  and Kenalog  as described above.  Patient tolerated the procedure well.  Postprocedure instructions  were given.  Handout on exercise was given.  I also offered physical therapy but she would like to wait.  Chronic left shoulder pain-she had a good response to last subacromial injection 1 year ago and had been doing well.  She had good range of motion of her left shoulder joint today.  Primary osteoarthritis of both hands-she has psoriatic arthritis and osteoarthritis overlap with bilateral PIP and DIP thickening and subluxation.  DDD (degenerative disc disease), cervical-Limited lateral rotation  was noted without discomfort.  Degeneration of intervertebral disc of lumbar region without discogenic back pain or lower extremity pain-she is doing better after the placement of a spinal cord stim unit.  Raynaud's disease without gangrene-today not symptomatic.  Keeping core temperature warm and warm clothing during the winter months was advised.  Osteopenia of multiple sites - Fosamax . 2023 DEXA T-score -1.6, BMD 0.817 RFN,-8.7% compared 2021. T-score 1.3, BMD 1.333 -12.1% compared to 2021.  Followed by her PCP.  History of gastroesophageal reflux (GERD)  History of migraine  Essential hypertension-blood pressure was normal at 133/72.  Orders: Orders Placed This Encounter  Procedures   Large Joint Inj   No orders of the defined types were placed in this encounter.   Follow-Up Instructions: Return in about 5 months (around 06/08/2024) for Psoriatic arthritis, Osteoarthritis.   Maya Nash, MD  Note - This record has been created using Animal nutritionist.  Chart creation errors have been sought, but may not always  have been located. Such creation errors do not reflect on  the standard of medical care.

## 2023-12-29 ENCOUNTER — Encounter: Payer: Self-pay | Admitting: Rheumatology

## 2023-12-30 MED ORDER — METHOTREXATE SODIUM 2.5 MG PO TABS: 15.0000 mg | ORAL_TABLET | ORAL | 0 refills | Status: DC

## 2023-12-30 NOTE — Telephone Encounter (Signed)
 Last Fill: 09/12/2023  Labs: 12/05/2023 CBC WNL CMP WNL   Next Visit: 01/07/2024  Last Visit: 07/23/2023  DX: Psoriatic arthropathy (HCC)   Current Dose per office note 07/23/2023: methotrexate  6 tablets by mouth every 7 days   Okay to refill Methotrexate ?

## 2024-01-02 ENCOUNTER — Inpatient Hospital Stay (HOSPITAL_COMMUNITY): Admission: RE | Admit: 2024-01-02 | Source: Ambulatory Visit

## 2024-01-02 ENCOUNTER — Ambulatory Visit (HOSPITAL_COMMUNITY)
Admission: RE | Admit: 2024-01-02 | Discharge: 2024-01-02 | Disposition: A | Discharge status: Home / Self Care | Source: Ambulatory Visit | Attending: Rheumatology | Admitting: Rheumatology

## 2024-01-02 VITALS — BP 121/55 | HR 55 | Temp 97.7°F | Resp 16 | Wt 102.0 lb

## 2024-01-02 DIAGNOSIS — L409 Psoriasis, unspecified: Secondary | ICD-10-CM | POA: Insufficient documentation

## 2024-01-02 DIAGNOSIS — L4050 Arthropathic psoriasis, unspecified: Secondary | ICD-10-CM | POA: Insufficient documentation

## 2024-01-02 MED ORDER — SODIUM CHLORIDE 0.9 % IV SOLN
2.0000 mg/kg | Freq: Once | INTRAVENOUS | Status: AC
  Administered 2024-01-02: 92.5 mg via INTRAVENOUS
  Filled 2024-01-02: qty 7.4

## 2024-01-02 MED ORDER — ACETAMINOPHEN 325 MG PO TABS: 650.0000 mg | ORAL_TABLET | Freq: Once | ORAL | Status: DC

## 2024-01-02 MED ORDER — DIPHENHYDRAMINE HCL 25 MG PO CAPS: 25.0000 mg | ORAL_CAPSULE | Freq: Once | ORAL | Status: DC

## 2024-01-07 ENCOUNTER — Ambulatory Visit: Admitting: Rheumatology

## 2024-01-07 ENCOUNTER — Encounter: Payer: Self-pay | Admitting: Rheumatology

## 2024-01-07 VITALS — BP 133/72 | HR 54 | Temp 98.1°F | Resp 13 | Ht 60.0 in | Wt 103.6 lb

## 2024-01-07 DIAGNOSIS — M51369 Other intervertebral disc degeneration, lumbar region without mention of lumbar back pain or lower extremity pain: Secondary | ICD-10-CM

## 2024-01-07 DIAGNOSIS — L4050 Arthropathic psoriasis, unspecified: Secondary | ICD-10-CM

## 2024-01-07 DIAGNOSIS — Z79899 Other long term (current) drug therapy: Secondary | ICD-10-CM | POA: Diagnosis not present

## 2024-01-07 DIAGNOSIS — Z8669 Personal history of other diseases of the nervous system and sense organs: Secondary | ICD-10-CM

## 2024-01-07 DIAGNOSIS — Z8719 Personal history of other diseases of the digestive system: Secondary | ICD-10-CM

## 2024-01-07 DIAGNOSIS — M25512 Pain in left shoulder: Secondary | ICD-10-CM

## 2024-01-07 DIAGNOSIS — M5030 Other cervical disc degeneration, unspecified cervical region: Secondary | ICD-10-CM

## 2024-01-07 DIAGNOSIS — G8929 Other chronic pain: Secondary | ICD-10-CM

## 2024-01-07 DIAGNOSIS — M25511 Pain in right shoulder: Secondary | ICD-10-CM | POA: Diagnosis not present

## 2024-01-07 DIAGNOSIS — M19042 Primary osteoarthritis, left hand: Secondary | ICD-10-CM

## 2024-01-07 DIAGNOSIS — M8589 Other specified disorders of bone density and structure, multiple sites: Secondary | ICD-10-CM

## 2024-01-07 DIAGNOSIS — M19041 Primary osteoarthritis, right hand: Secondary | ICD-10-CM

## 2024-01-07 DIAGNOSIS — L409 Psoriasis, unspecified: Secondary | ICD-10-CM | POA: Diagnosis not present

## 2024-01-07 DIAGNOSIS — I10 Essential (primary) hypertension: Secondary | ICD-10-CM

## 2024-01-07 DIAGNOSIS — I7300 Raynaud's syndrome without gangrene: Secondary | ICD-10-CM

## 2024-01-07 MED ORDER — TRIAMCINOLONE ACETONIDE 40 MG/ML IJ SUSP
40.0000 mg | INTRAMUSCULAR | Status: AC | PRN
  Administered 2024-01-07: 40 mg via INTRA_ARTICULAR

## 2024-01-07 MED ORDER — LIDOCAINE HCL 1 % IJ SOLN
1.0000 mL | INTRAMUSCULAR | Status: AC | PRN
  Administered 2024-01-07: 1 mL

## 2024-01-07 NOTE — Patient Instructions (Addendum)
 Vaccines You are taking a medication(s) that can suppress your immune system.  The following immunizations are recommended: Flu annually-please hold methotrexate  for 2 weeks after the flu vaccine. Covid-19  Td/Tdap (tetanus, diphtheria, pertussis) every 10 years Pneumonia (Prevnar 15 then Pneumovax 23 at least 1 year apart.  Alternatively, can take Prevnar 20 without needing additional dose) Shingrix: 2 doses from 4 weeks to 6 months apart  Please check with your PCP to make sure you are up to date.  If you have signs or symptoms of an infection or start antibiotics: First, call your PCP for workup of your infection. Hold your medication through the infection, until you complete your antibiotics, and until symptoms resolve if you take the following: Injectable medication (Actemra, Benlysta, Cimzia, Cosentyx, Enbrel, Humira , Kevzara, Orencia, Remicade, [name] , Stelara, Taltz, Tremfya) Methotrexate  Leflunomide (Arava) Mycophenolate (Cellcept) Earma, Olumiant, or Rinvoq  Please get annual skin examination to screen for skin cancer.  Please use sunscreen and sun protection.  Shoulder Exercises Ask your health care provider which exercises are safe for you. Do exercises exactly as told by your health care provider and adjust them as directed. It is normal to feel mild stretching, pulling, tightness, or discomfort as you do these exercises. Stop right away if you feel sudden pain or your pain gets worse. Do not begin these exercises until told by your health care provider. Stretching exercises External rotation and abduction This exercise is sometimes called corner stretch. The exercise rotates your arm outward (external rotation) and moves your arm out from your body (abduction). Stand in a doorway with one of your feet slightly in front of the other. This is called a staggered stance. If you cannot reach your forearms to the door frame, stand facing a corner of a room. Choose one of the  following positions as told by your health care provider: Place your hands and forearms on the door frame above your head. Place your hands and forearms on the door frame at the height of your head. Place your hands on the door frame at the height of your elbows. Slowly move your weight onto your front foot until you feel a stretch across your chest and in the front of your shoulders. Keep your head and chest upright and keep your abdominal muscles tight. Hold for __________ seconds. To release the stretch, shift your weight to your back foot. Repeat __________ times. Complete this exercise __________ times a day. Extension, standing  Stand and hold a broomstick, a cane, or a similar object behind your back. Your hands should be a little wider than shoulder-width apart. Your palms should face away from your back. Keeping your elbows straight and your shoulder muscles relaxed, move the stick away from your body until you feel a stretch in your shoulders (extension). Avoid shrugging your shoulders while you move the stick. Keep your shoulder blades tucked down toward the middle of your back. Hold for __________ seconds. Slowly return to the starting position. Repeat __________ times. Complete this exercise __________ times a day. Range-of-motion exercises Pendulum  Stand near a wall or a surface that you can hold onto for balance. Bend at the waist and let your left / right arm hang straight down. Use your other arm to support you. Keep your back straight and do not lock your knees. Relax your left / right arm and shoulder muscles, and move your hips and your trunk so your left / right arm swings freely. Your arm should swing because of the  motion of your body, not because you are using your arm or shoulder muscles. Keep moving your hips and trunk so your arm swings in the following directions, as told by your health care provider: Side to side. Forward and backward. In clockwise and  counterclockwise circles. Continue each motion for __________ seconds, or for as long as told by your health care provider. Slowly return to the starting position. Repeat __________ times. Complete this exercise __________ times a day. Shoulder flexion, standing  Stand and hold a broomstick, a cane, or a similar object. Place your hands a little more than shoulder-width apart on the object. Your left / right hand should be palm-up, and your other hand should be palm-down. Keep your elbow straight and your shoulder muscles relaxed. Push the stick up with your healthy arm to raise your left / right arm in front of your body, and then over your head until you feel a stretch in your shoulder (flexion). Avoid shrugging your shoulder while you raise your arm. Keep your shoulder blade tucked down toward the middle of your back. Hold for __________ seconds. Slowly return to the starting position. Repeat __________ times. Complete this exercise __________ times a day. Shoulder abduction, standing  Stand and hold a broomstick, a cane, or a similar object. Place your hands a little more than shoulder-width apart on the object. Your left / right hand should be palm-up, and your other hand should be palm-down. Keep your elbow straight and your shoulder muscles relaxed. Push the object across your body toward your left / right side. Raise your left / right arm to the side of your body (abduction) until you feel a stretch in your shoulder. Do not raise your arm above shoulder height unless your health care provider tells you to do that. If directed, raise your arm over your head. Avoid shrugging your shoulder while you raise your arm. Keep your shoulder blade tucked down toward the middle of your back. Hold for __________ seconds. Slowly return to the starting position. Repeat __________ times. Complete this exercise __________ times a day. Internal rotation  Place your left / right hand behind your back,  palm-up. Use your other hand to dangle an exercise band, a broomstick, or a similar object over your shoulder. Grasp the band with your left / right hand so you are holding on to both ends. Gently pull up on the band until you feel a stretch in the front of your left / right shoulder. The movement of your arm toward the center of your body is called internal rotation. Avoid shrugging your shoulder while you raise your arm. Keep your shoulder blade tucked down toward the middle of your back. Hold for __________ seconds. Release the stretch by letting go of the band and lowering your hands. Repeat __________ times. Complete this exercise __________ times a day. Strengthening exercises External rotation  Sit in a stable chair without armrests. Secure an exercise band to a stable object at elbow height on your left / right side. Place a soft object, such as a folded towel or a small pillow, between your left / right upper arm and your body to move your elbow about 4 inches (10 cm) away from your side. Hold the end of the exercise band so it is tight and there is no slack. Keeping your elbow pressed against the soft object, slowly move your forearm out, away from your abdomen (external rotation). Keep your body steady so only your forearm moves. Hold for  __________ seconds. Slowly return to the starting position. Repeat __________ times. Complete this exercise __________ times a day. Shoulder abduction  Sit in a stable chair without armrests, or stand up. Hold a __________ lb / kg weight in your left / right hand, or hold an exercise band with both hands. Start with your arms straight down and your left / right palm facing in, toward your body. Slowly lift your left / right hand out to your side (abduction). Do not lift your hand above shoulder height unless your health care provider tells you that this is safe. Keep your arms straight. Avoid shrugging your shoulder while you do this movement.  Keep your shoulder blade tucked down toward the middle of your back. Hold for __________ seconds. Slowly lower your arm, and return to the starting position. Repeat __________ times. Complete this exercise __________ times a day. Shoulder extension  Sit in a stable chair without armrests, or stand up. Secure an exercise band to a stable object in front of you so it is at shoulder height. Hold one end of the exercise band in each hand. Straighten your elbows and lift your hands up to shoulder height. Squeeze your shoulder blades together as you pull your hands down to the sides of your thighs (extension). Stop when your hands are straight down by your sides. Do not let your hands go behind your body. Hold for __________ seconds. Slowly return to the starting position. Repeat __________ times. Complete this exercise __________ times a day. Shoulder row  Sit in a stable chair without armrests, or stand up. Secure an exercise band to a stable object in front of you so it is at chest height. Hold one end of the exercise band in each hand. Position your palms so that your thumbs are facing the ceiling (neutral position). Bend each of your elbows to a 90-degree angle (right angle) and keep your upper arms at your sides. Step back or move the chair back until the band is tight and there is no slack. Slowly pull your elbows back behind you. Hold for __________ seconds. Slowly return to the starting position. Repeat __________ times. Complete this exercise __________ times a day. Shoulder press-ups  Sit in a stable chair that has armrests. Sit upright, with your feet flat on the floor. Put your hands on the armrests so your elbows are bent and your fingers are pointing forward. Your hands should be about even with the sides of your body. Push down on the armrests and use your arms to lift yourself off the chair. Straighten your elbows and lift yourself up as much as you comfortably can. Move your  shoulder blades down, and avoid letting your shoulders move up toward your ears. Keep your feet on the ground. As you get stronger, your feet should support less of your body weight as you lift yourself up. Hold for __________ seconds. Slowly lower yourself back into the chair. Repeat __________ times. Complete this exercise __________ times a day. Wall push-ups  Stand so you are facing a stable wall. Your feet should be about one arm-length away from the wall. Lean forward and place your palms on the wall at shoulder height. Keep your feet flat on the floor as you bend your elbows and lean forward toward the wall. Hold for __________ seconds. Straighten your elbows to push yourself back to the starting position. Repeat __________ times. Complete this exercise __________ times a day. This information is not intended to replace advice given  to you by your health care provider. Make sure you discuss any questions you have with your health care provider. Document Revised: 05/29/2021 Document Reviewed: 05/29/2021 Elsevier Patient Education  2024 [employer].

## 2024-01-26 ENCOUNTER — Encounter (HOSPITAL_COMMUNITY)

## 2024-01-30 ENCOUNTER — Encounter (HOSPITAL_COMMUNITY)

## 2024-02-27 ENCOUNTER — Ambulatory Visit (HOSPITAL_COMMUNITY)
Admission: RE | Admit: 2024-02-27 | Discharge: 2024-02-27 | Disposition: A | Discharge status: Home / Self Care | Source: Ambulatory Visit | Attending: Rheumatology | Admitting: Rheumatology

## 2024-02-27 VITALS — BP 131/65 | HR 52 | Temp 97.7°F | Resp 16 | Wt 102.0 lb

## 2024-02-27 DIAGNOSIS — L4050 Arthropathic psoriasis, unspecified: Secondary | ICD-10-CM | POA: Diagnosis present

## 2024-02-27 DIAGNOSIS — L409 Psoriasis, unspecified: Secondary | ICD-10-CM | POA: Diagnosis present

## 2024-02-27 LAB — CBC WITH DIFFERENTIAL/PLATELET
Abs Immature Granulocytes: 0.01 K/uL (ref 0.00–0.07)
Basophils Absolute: 0.1 K/uL (ref 0.0–0.1)
Basophils Relative: 1 %
Eosinophils Absolute: 0.3 K/uL (ref 0.0–0.5)
Eosinophils Relative: 4 %
HCT: 39.8 % (ref 36.0–46.0)
Hemoglobin: 13 g/dL (ref 12.0–15.0)
Immature Granulocytes: 0 %
Lymphocytes Relative: 30 %
Lymphs Abs: 2.2 K/uL (ref 0.7–4.0)
MCH: 31 pg (ref 26.0–34.0)
MCHC: 32.7 g/dL (ref 30.0–36.0)
MCV: 94.8 fL (ref 80.0–100.0)
Monocytes Absolute: 0.9 K/uL (ref 0.1–1.0)
Monocytes Relative: 12 %
Neutro Abs: 4 K/uL (ref 1.7–7.7)
Neutrophils Relative %: 53 %
Platelets: 314 K/uL (ref 150–400)
RBC: 4.2 MIL/uL (ref 3.87–5.11)
RDW: 14.5 % (ref 11.5–15.5)
WBC: 7.3 K/uL (ref 4.0–10.5)
nRBC: 0 % (ref 0.0–0.2)

## 2024-02-27 LAB — COMPREHENSIVE METABOLIC PANEL WITH GFR
ALT: 15 U/L (ref 0–44)
AST: 26 U/L (ref 15–41)
Albumin: 3.6 g/dL (ref 3.5–5.0)
Alkaline Phosphatase: 61 U/L (ref 38–126)
Anion gap: 10 (ref 5–15)
BUN: 16 mg/dL (ref 8–23)
CO2: 25 mmol/L (ref 22–32)
Calcium: 9.1 mg/dL (ref 8.9–10.3)
Chloride: 101 mmol/L (ref 98–111)
Creatinine, Ser: 0.64 mg/dL (ref 0.44–1.00)
GFR, Estimated: >60 mL/min (ref >60)
Glucose, Bld: 90 mg/dL (ref 70–99)
Potassium: 4.4 mmol/L (ref 3.5–5.1)
Sodium: 136 mmol/L (ref 135–145)
Total Bilirubin: 0.7 mg/dL (ref 0.0–1.2)
Total Protein: 6.9 g/dL (ref 6.5–8.1)

## 2024-02-27 MED ORDER — DIPHENHYDRAMINE HCL 25 MG PO CAPS: 25.0000 mg | ORAL_CAPSULE | Freq: Once | ORAL | Status: DC

## 2024-02-27 MED ORDER — ACETAMINOPHEN 325 MG PO TABS: 650.0000 mg | ORAL_TABLET | Freq: Once | ORAL | Status: DC

## 2024-02-27 MED ORDER — SODIUM CHLORIDE 0.9 % IV SOLN
2.0000 mg/kg | Freq: Once | INTRAVENOUS | Status: AC
  Administered 2024-02-27: 92.5 mg via INTRAVENOUS
  Filled 2024-02-27: qty 7.4

## 2024-02-29 ENCOUNTER — Ambulatory Visit: Payer: Self-pay | Admitting: Rheumatology

## 2024-03-22 ENCOUNTER — Encounter: Payer: Self-pay | Admitting: Rheumatology

## 2024-03-22 MED ORDER — METHOTREXATE SODIUM 2.5 MG PO TABS: 15.0000 mg | ORAL_TABLET | ORAL | 0 refills | Status: AC

## 2024-03-22 NOTE — Telephone Encounter (Signed)
 Last Fill: 12/30/2023   Labs: 02/27/2024 CBC and CMP WNL   Next Visit: 06/09/2024  Last Visit: 01/07/2024  DX:  Psoriatic arthropathy    Current Dose per office note on 01/07/2024: methotrexate  6 tablets by mouth every 7 days   Okay to refill Methotrexate ?

## 2024-04-20 ENCOUNTER — Telehealth (HOSPITAL_COMMUNITY): Payer: Self-pay

## 2024-04-20 NOTE — Telephone Encounter (Signed)
 Auth Submission: NO AUTH NEEDED Site of care: Site of care: [facility] Payer: [employer] Medicare Medication & CPT/J Code(s) submitted: [name]  aria (Golimumab ) [id number] Diagnosis Code: L40.9, L40.50 Route of submission (phone, fax, portal): portal Phone # Fax # Auth type: Buy/Bill HB Units/visits requested: 2mg /kg q8weeks Reference number: [id number] Approval from: 04/22/24 to 04/21/25

## 2024-04-23 ENCOUNTER — Ambulatory Visit: Payer: Self-pay | Admitting: Rheumatology

## 2024-04-23 ENCOUNTER — Ambulatory Visit (HOSPITAL_COMMUNITY)
Admission: RE | Admit: 2024-04-23 | Discharge: 2024-04-23 | Disposition: A | Discharge status: Home / Self Care | Source: Ambulatory Visit | Attending: Rheumatology | Admitting: Rheumatology

## 2024-04-23 VITALS — BP 134/56 | HR 51 | Temp 97.9°F | Resp 16 | Wt 102.0 lb

## 2024-04-23 DIAGNOSIS — L409 Psoriasis, unspecified: Secondary | ICD-10-CM | POA: Diagnosis present

## 2024-04-23 DIAGNOSIS — L4050 Arthropathic psoriasis, unspecified: Secondary | ICD-10-CM | POA: Diagnosis present

## 2024-04-23 LAB — COMPREHENSIVE METABOLIC PANEL WITH GFR
ALT: 13 U/L (ref 0–44)
AST: 24 U/L (ref 15–41)
Albumin: 4.1 g/dL (ref 3.5–5.0)
Alkaline Phosphatase: 75 U/L (ref 38–126)
Anion gap: 12 (ref 5–15)
BUN: 19 mg/dL (ref 8–23)
CO2: 23 mmol/L (ref 22–32)
Calcium: 9.3 mg/dL (ref 8.9–10.3)
Chloride: 102 mmol/L (ref 98–111)
Creatinine, Ser: 0.59 mg/dL (ref 0.44–1.00)
GFR, Estimated: >60 mL/min
Glucose, Bld: 94 mg/dL (ref 70–99)
Potassium: 4.2 mmol/L (ref 3.5–5.1)
Sodium: 137 mmol/L (ref 135–145)
Total Bilirubin: 0.6 mg/dL (ref 0.0–1.2)
Total Protein: 7.1 g/dL (ref 6.5–8.1)

## 2024-04-23 LAB — CBC WITH DIFFERENTIAL/PLATELET
Abs Immature Granulocytes: 0.02 K/uL (ref 0.00–0.07)
Basophils Absolute: 0.1 K/uL (ref 0.0–0.1)
Basophils Relative: 1 %
Eosinophils Absolute: 0.3 K/uL (ref 0.0–0.5)
Eosinophils Relative: 5 %
HCT: 37.9 % (ref 36.0–46.0)
Hemoglobin: 12.4 g/dL (ref 12.0–15.0)
Immature Granulocytes: 0 %
Lymphocytes Relative: 29 %
Lymphs Abs: 2.1 K/uL (ref 0.7–4.0)
MCH: 30.8 pg (ref 26.0–34.0)
MCHC: 32.7 g/dL (ref 30.0–36.0)
MCV: 94 fL (ref 80.0–100.0)
Monocytes Absolute: 1 K/uL (ref 0.1–1.0)
Monocytes Relative: 14 %
Neutro Abs: 3.8 K/uL (ref 1.7–7.7)
Neutrophils Relative %: 51 %
Platelets: 342 K/uL (ref 150–400)
RBC: 4.03 MIL/uL (ref 3.87–5.11)
RDW: 15.3 % (ref 11.5–15.5)
WBC: 7.3 K/uL (ref 4.0–10.5)
nRBC: 0 % (ref 0.0–0.2)

## 2024-04-23 MED ORDER — ACETAMINOPHEN 325 MG PO TABS: 650.0000 mg | ORAL_TABLET | Freq: Once | ORAL | Status: DC

## 2024-04-23 MED ORDER — SODIUM CHLORIDE 0.9 % IV SOLN
2.0000 mg/kg | Freq: Once | INTRAVENOUS | Status: AC
  Administered 2024-04-23: 92.5 mg via INTRAVENOUS
  Filled 2024-04-23: qty 7.4

## 2024-04-23 MED ORDER — DIPHENHYDRAMINE HCL 25 MG PO CAPS: 25.0000 mg | ORAL_CAPSULE | Freq: Once | ORAL | Status: DC

## 2024-05-26 NOTE — Progress Notes (Unsigned)
 "  Office Visit Note  Patient: Amy Hayes             Date of Birth: 1946/04/21           MRN: [id number]             PCP: Ezra Evalene PARAS, MD Referring: Ezra Evalene PARAS, MD Visit Date: 06/09/2024 Occupation: Data Unavailable  Subjective:  No chief complaint on file.   History of Present Illness: Amy Hayes is a 79 y.o. female ***     Activities of Daily Living:  Patient reports morning stiffness for *** {minute/hour:19697}.   Patient {ACTIONS;DENIES/REPORTS:21021675::Denies} nocturnal pain.  Difficulty dressing/grooming: {ACTIONS;DENIES/REPORTS:21021675::Denies} Difficulty climbing stairs: {ACTIONS;DENIES/REPORTS:21021675::Denies} Difficulty getting out of chair: {ACTIONS;DENIES/REPORTS:21021675::Denies} Difficulty using hands for taps, buttons, cutlery, and/or writing: {ACTIONS;DENIES/REPORTS:21021675::Denies}  No Rheumatology ROS completed.   PMFS History:  Patient Active Problem List   Diagnosis Date Noted   Essential hypertension 12/23/2019   Pain in both lower extremities 12/23/2019   Psoriatic arthropathy (HCC) 12/22/2018   DDD (degenerative disc disease), lumbar 07/24/2017   Primary osteoarthritis of both hands 03/04/2017   DDD (degenerative disc disease), cervical s/p Fusion  10/30/2016   Raynaud's disease without gangrene 10/30/2016   History of migraine 10/30/2016   History of gastroesophageal reflux (GERD) 10/30/2016   High risk medication use 10/30/2016   Psoriasis 05/02/2016   Bilateral low back pain with sciatica 05/02/2016   Osteopenia 05/02/2016    Past Medical History:  Diagnosis Date   Arthritis    GERD (gastroesophageal reflux disease)    Hyperlipidemia    Hypertension    Migraine    Osteoporosis     Family History  Problem Relation Age of Onset   Leukemia Mother    Stroke Father    Hypertension Sister    Hypertension Brother    Stroke Brother    Past Surgical History:  Procedure Laterality Date   ABDOMINAL  HYSTERECTOMY     CARPAL TUNNEL RELEASE     CERVICAL FUSION     EYE SURGERY     ingrown toenail removal     KNEE ARTHROSCOPY Right    KNEE CARTILAGE SURGERY     reactive implant surgery  10/31/2023   ROTATOR CUFF REPAIR     SPINAL FUSION     cervical fusion   Social History[1] Social History   Social History Narrative   Not on file     Immunization History  Administered Date(s) Administered   INFLUENZA, HIGH DOSE SEASONAL PF 02/06/2015, 01/23/2016, 01/20/2017, 02/20/2018   Influenza Split 02/15/2010   Influenza, Seasonal, Injecte, Preservative Fre 02/11/2011, 01/04/2013, 04/05/2014   Influenza,trivalent, recombinat, inj, PF 01/31/2012   Influenza-Unspecified 02/10/2023   PFIZER(Purple Top)SARS-COV-2 Vaccination 07/05/2019, 07/26/2019, 12/17/2019, 07/04/2020, 03/07/2021   Pneumococcal Conjugate-13 08/11/2014   Pneumococcal Polysaccharide-23 05/29/2009, 08/29/2015   Td 07/06/2018   Tdap 12/17/2007   Unspecified SARS-COV-2 Vaccination 02/10/2023   Zoster Recombinant(Shingrix) 02/27/2018, 06/12/2018     Objective: Vital Signs: There were no vitals taken for this visit.   Physical Exam   Musculoskeletal Exam: ***  CDAI Exam: CDAI Score: -- Patient Global: --; Provider Global: -- Swollen: --; Tender: -- Joint Exam 06/09/2024   No joint exam has been documented for this visit   There is currently no information documented on the homunculus. Go to the Rheumatology activity and complete the homunculus joint exam.  Investigation: No additional findings.  Imaging: No results found.  Recent Labs: Lab Results  Component Value Date   WBC 7.3 04/23/2024  HGB 12.4 04/23/2024   PLT 342 04/23/2024   NA 137 04/23/2024   K 4.2 04/23/2024   CL 102 04/23/2024   CO2 23 04/23/2024   GLUCOSE 94 04/23/2024   BUN 19 04/23/2024   CREATININE 0.59 04/23/2024   BILITOT 0.6 04/23/2024   ALKPHOS 75 04/23/2024   AST 24 04/23/2024   ALT 13 04/23/2024   PROT 7.1 04/23/2024    ALBUMIN 4.1 04/23/2024   CALCIUM 9.3 04/23/2024   GFRAA >60 12/09/2019   QFTBGOLDPLUS Negative 08/08/2023    Speciality Comments: [name]  Aria infusions every 8 weeks, started on 11/2016 TB gold negative 08/01/16  DEXA Scan in Care Everywhere 12/18/2022  Procedures:  No procedures performed Allergies: Codeine   Assessment / Plan:     Visit Diagnoses: Psoriatic arthropathy (HCC)  Psoriasis  High risk medication use  Chronic right shoulder pain  Chronic left shoulder pain  Primary osteoarthritis of both hands  DDD (degenerative disc disease), cervical  Degeneration of intervertebral disc of lumbar region without discogenic back pain or lower extremity pain  Raynaud's disease without gangrene  Osteopenia of multiple sites  History of gastroesophageal reflux (GERD)  History of migraine  Essential hypertension  Orders: No orders of the defined types were placed in this encounter.  No orders of the defined types were placed in this encounter.   Face-to-face time spent with patient was *** minutes. Greater than 50% of time was spent in counseling and coordination of care.  Follow-Up Instructions: No follow-ups on file.   Waddell CHRISTELLA Craze, PA-C  Note - This record has been created using Dragon software.  Chart creation errors have been sought, but may not always  have been located. Such creation errors do not reflect on  the standard of medical care.     [1]  Social History Tobacco Use   Smoking status: Never    Passive exposure: Never   Smokeless tobacco: Never  Vaping Use   Vaping status: Never Used  Substance Use Topics   Alcohol use: No   Drug use: No   "

## 2024-06-09 ENCOUNTER — Ambulatory Visit: Admitting: Physician Assistant

## 2024-06-09 DIAGNOSIS — I7300 Raynaud's syndrome without gangrene: Secondary | ICD-10-CM

## 2024-06-09 DIAGNOSIS — G8929 Other chronic pain: Secondary | ICD-10-CM

## 2024-06-09 DIAGNOSIS — M5030 Other cervical disc degeneration, unspecified cervical region: Secondary | ICD-10-CM

## 2024-06-09 DIAGNOSIS — M19041 Primary osteoarthritis, right hand: Secondary | ICD-10-CM

## 2024-06-09 DIAGNOSIS — M8589 Other specified disorders of bone density and structure, multiple sites: Secondary | ICD-10-CM

## 2024-06-09 DIAGNOSIS — Z8719 Personal history of other diseases of the digestive system: Secondary | ICD-10-CM

## 2024-06-09 DIAGNOSIS — M51369 Other intervertebral disc degeneration, lumbar region without mention of lumbar back pain or lower extremity pain: Secondary | ICD-10-CM

## 2024-06-09 DIAGNOSIS — Z8669 Personal history of other diseases of the nervous system and sense organs: Secondary | ICD-10-CM

## 2024-06-09 DIAGNOSIS — L4050 Arthropathic psoriasis, unspecified: Secondary | ICD-10-CM

## 2024-06-09 DIAGNOSIS — L409 Psoriasis, unspecified: Secondary | ICD-10-CM

## 2024-06-09 DIAGNOSIS — Z79899 Other long term (current) drug therapy: Secondary | ICD-10-CM

## 2024-06-09 DIAGNOSIS — I10 Essential (primary) hypertension: Secondary | ICD-10-CM

## 2024-06-18 ENCOUNTER — Encounter (HOSPITAL_COMMUNITY)
